# Patient Record
Sex: Female | Born: 1950 | Race: White | Hispanic: No | Marital: Married | Smoking: Never smoker
Health system: Southern US, Community
[De-identification: ages and names within clinical notes are randomized; demographics above are authoritative.]

## PROBLEM LIST (undated history)

## (undated) DIAGNOSIS — F32A Depression, unspecified: Secondary | ICD-10-CM

## (undated) DIAGNOSIS — F329 Major depressive disorder, single episode, unspecified: Secondary | ICD-10-CM

## (undated) DIAGNOSIS — M1990 Unspecified osteoarthritis, unspecified site: Secondary | ICD-10-CM

## (undated) DIAGNOSIS — F419 Anxiety disorder, unspecified: Secondary | ICD-10-CM

## (undated) DIAGNOSIS — I10 Essential (primary) hypertension: Secondary | ICD-10-CM

## (undated) DIAGNOSIS — T7840XA Allergy, unspecified, initial encounter: Secondary | ICD-10-CM

## (undated) HISTORY — PX: ABDOMINAL HYSTERECTOMY: SHX81

## (undated) HISTORY — PX: TONSILLECTOMY: SUR1361

## (undated) HISTORY — DX: Allergy, unspecified, initial encounter: T78.40XA

## (undated) HISTORY — DX: Essential (primary) hypertension: I10

## (undated) HISTORY — PX: APPENDECTOMY: SHX54

---
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
## 2011-04-27 ENCOUNTER — Ambulatory Visit: Payer: Self-pay | Admitting: Anesthesiology

## 2011-05-18 ENCOUNTER — Ambulatory Visit: Payer: Self-pay | Admitting: Anesthesiology

## 2011-06-19 ENCOUNTER — Ambulatory Visit: Payer: Self-pay | Admitting: Anesthesiology

## 2011-09-12 ENCOUNTER — Other Ambulatory Visit: Payer: Self-pay | Admitting: Neurosurgery

## 2011-09-12 DIAGNOSIS — M549 Dorsalgia, unspecified: Secondary | ICD-10-CM

## 2011-09-17 ENCOUNTER — Ambulatory Visit
Admission: RE | Admit: 2011-09-17 | Discharge: 2011-09-17 | Disposition: A | Discharge status: Home / Self Care | Payer: Worker's Compensation | Source: Ambulatory Visit | Attending: Neurosurgery | Admitting: Neurosurgery

## 2011-09-17 VITALS — BP 142/93 | HR 76

## 2011-09-17 DIAGNOSIS — M549 Dorsalgia, unspecified: Secondary | ICD-10-CM

## 2011-09-17 MED ORDER — ONDANSETRON HCL 4 MG/2ML IJ SOLN
4.0000 mg | Freq: Once | INTRAMUSCULAR | Status: AC
  Administered 2011-09-17: 4 mg via INTRAMUSCULAR

## 2011-09-17 MED ORDER — DIAZEPAM 5 MG PO TABS
10.0000 mg | ORAL_TABLET | Freq: Once | ORAL | Status: AC
  Administered 2011-09-17: 10 mg via ORAL

## 2011-09-17 MED ORDER — IOHEXOL 180 MG/ML  SOLN
17.0000 mL | Freq: Once | INTRAMUSCULAR | Status: AC | PRN
  Administered 2011-09-17: 17 mL via INTRAVENOUS

## 2011-09-17 MED ORDER — MEPERIDINE HCL 100 MG/ML IJ SOLN
75.0000 mg | Freq: Once | INTRAMUSCULAR | Status: AC
  Administered 2011-09-17: 75 mg via INTRAMUSCULAR

## 2011-09-17 NOTE — Progress Notes (Addendum)
Resting quietly in nursing station after myelogram.  Denies pain but states she is "just having some cramping."  Doesn't want pain medication at present.  Husband at bedside.  jkl  [phone]  Pt asked for pain meds due to back pain and IM pain meds given.dd

## 2011-09-17 NOTE — Discharge Instructions (Signed)

## 2011-09-18 ENCOUNTER — Telehealth: Payer: Self-pay

## 2011-09-18 NOTE — Telephone Encounter (Signed)
Explained to husband that it was not unusual for people to be more sore than normal post myelo, the contrast is irritating to the tissue and they also had a needle stick that could also be sore.dd

## 2014-04-07 DIAGNOSIS — M48062 Spinal stenosis, lumbar region with neurogenic claudication: Secondary | ICD-10-CM | POA: Insufficient documentation

## 2014-04-07 DIAGNOSIS — M5136 Other intervertebral disc degeneration, lumbar region: Secondary | ICD-10-CM | POA: Insufficient documentation

## 2014-04-07 DIAGNOSIS — M51369 Other intervertebral disc degeneration, lumbar region without mention of lumbar back pain or lower extremity pain: Secondary | ICD-10-CM | POA: Insufficient documentation

## 2014-04-07 DIAGNOSIS — M5416 Radiculopathy, lumbar region: Secondary | ICD-10-CM | POA: Insufficient documentation

## 2015-06-15 DIAGNOSIS — I10 Essential (primary) hypertension: Secondary | ICD-10-CM | POA: Diagnosis not present

## 2015-06-15 DIAGNOSIS — F329 Major depressive disorder, single episode, unspecified: Secondary | ICD-10-CM | POA: Diagnosis not present

## 2015-11-29 DIAGNOSIS — I10 Essential (primary) hypertension: Secondary | ICD-10-CM | POA: Diagnosis not present

## 2016-06-13 DIAGNOSIS — R21 Rash and other nonspecific skin eruption: Secondary | ICD-10-CM | POA: Diagnosis not present

## 2016-06-13 DIAGNOSIS — M545 Low back pain: Secondary | ICD-10-CM | POA: Diagnosis not present

## 2016-06-13 DIAGNOSIS — M79605 Pain in left leg: Secondary | ICD-10-CM | POA: Diagnosis not present

## 2016-11-14 DIAGNOSIS — M5030 Other cervical disc degeneration, unspecified cervical region: Secondary | ICD-10-CM | POA: Diagnosis not present

## 2016-11-14 DIAGNOSIS — M19011 Primary osteoarthritis, right shoulder: Secondary | ICD-10-CM | POA: Diagnosis not present

## 2016-11-14 DIAGNOSIS — M19012 Primary osteoarthritis, left shoulder: Secondary | ICD-10-CM | POA: Diagnosis not present

## 2016-11-14 DIAGNOSIS — M5412 Radiculopathy, cervical region: Secondary | ICD-10-CM | POA: Diagnosis not present

## 2017-01-11 DIAGNOSIS — M7541 Impingement syndrome of right shoulder: Secondary | ICD-10-CM | POA: Diagnosis not present

## 2017-01-11 DIAGNOSIS — M5412 Radiculopathy, cervical region: Secondary | ICD-10-CM | POA: Diagnosis not present

## 2017-01-11 DIAGNOSIS — M5030 Other cervical disc degeneration, unspecified cervical region: Secondary | ICD-10-CM | POA: Diagnosis not present

## 2017-01-11 DIAGNOSIS — M19012 Primary osteoarthritis, left shoulder: Secondary | ICD-10-CM | POA: Diagnosis not present

## 2017-01-11 DIAGNOSIS — M7542 Impingement syndrome of left shoulder: Secondary | ICD-10-CM | POA: Diagnosis not present

## 2017-01-11 DIAGNOSIS — M19011 Primary osteoarthritis, right shoulder: Secondary | ICD-10-CM | POA: Diagnosis not present

## 2017-03-21 DIAGNOSIS — I10 Essential (primary) hypertension: Secondary | ICD-10-CM | POA: Diagnosis not present

## 2017-03-21 DIAGNOSIS — Z760 Encounter for issue of repeat prescription: Secondary | ICD-10-CM | POA: Diagnosis not present

## 2017-03-21 DIAGNOSIS — F419 Anxiety disorder, unspecified: Secondary | ICD-10-CM | POA: Diagnosis not present

## 2017-04-12 ENCOUNTER — Other Ambulatory Visit: Payer: Self-pay | Admitting: Physical Medicine and Rehabilitation

## 2017-04-12 DIAGNOSIS — M19012 Primary osteoarthritis, left shoulder: Secondary | ICD-10-CM | POA: Diagnosis not present

## 2017-04-12 DIAGNOSIS — M5412 Radiculopathy, cervical region: Secondary | ICD-10-CM

## 2017-04-12 DIAGNOSIS — M19011 Primary osteoarthritis, right shoulder: Secondary | ICD-10-CM | POA: Diagnosis not present

## 2017-04-12 DIAGNOSIS — M7542 Impingement syndrome of left shoulder: Secondary | ICD-10-CM | POA: Diagnosis not present

## 2017-04-12 DIAGNOSIS — M5030 Other cervical disc degeneration, unspecified cervical region: Secondary | ICD-10-CM | POA: Diagnosis not present

## 2017-04-12 DIAGNOSIS — M7541 Impingement syndrome of right shoulder: Secondary | ICD-10-CM

## 2017-04-23 ENCOUNTER — Ambulatory Visit
Admission: RE | Admit: 2017-04-23 | Discharge: 2017-04-23 | Disposition: A | Discharge status: Home / Self Care | Payer: PPO | Source: Ambulatory Visit | Attending: Physical Medicine and Rehabilitation | Admitting: Physical Medicine and Rehabilitation

## 2017-04-23 DIAGNOSIS — M542 Cervicalgia: Secondary | ICD-10-CM | POA: Diagnosis not present

## 2017-04-23 DIAGNOSIS — M19011 Primary osteoarthritis, right shoulder: Secondary | ICD-10-CM | POA: Insufficient documentation

## 2017-04-23 DIAGNOSIS — M7541 Impingement syndrome of right shoulder: Secondary | ICD-10-CM | POA: Diagnosis not present

## 2017-04-23 DIAGNOSIS — M67911 Unspecified disorder of synovium and tendon, right shoulder: Secondary | ICD-10-CM | POA: Diagnosis not present

## 2017-04-23 DIAGNOSIS — M7551 Bursitis of right shoulder: Secondary | ICD-10-CM | POA: Diagnosis not present

## 2017-04-23 DIAGNOSIS — M50122 Cervical disc disorder at C5-C6 level with radiculopathy: Secondary | ICD-10-CM | POA: Diagnosis not present

## 2017-04-23 DIAGNOSIS — M5030 Other cervical disc degeneration, unspecified cervical region: Secondary | ICD-10-CM | POA: Diagnosis present

## 2017-04-23 DIAGNOSIS — M4802 Spinal stenosis, cervical region: Secondary | ICD-10-CM | POA: Diagnosis not present

## 2017-04-23 DIAGNOSIS — M19012 Primary osteoarthritis, left shoulder: Secondary | ICD-10-CM | POA: Insufficient documentation

## 2017-04-23 DIAGNOSIS — M25511 Pain in right shoulder: Secondary | ICD-10-CM | POA: Diagnosis not present

## 2017-04-23 DIAGNOSIS — M5412 Radiculopathy, cervical region: Secondary | ICD-10-CM

## 2017-04-26 ENCOUNTER — Other Ambulatory Visit: Payer: Self-pay | Admitting: Physical Medicine and Rehabilitation

## 2017-04-26 DIAGNOSIS — M25512 Pain in left shoulder: Secondary | ICD-10-CM

## 2017-05-03 ENCOUNTER — Ambulatory Visit
Admission: RE | Admit: 2017-05-03 | Discharge: 2017-05-03 | Disposition: A | Discharge status: Home / Self Care | Payer: PPO | Source: Ambulatory Visit | Attending: Physical Medicine and Rehabilitation | Admitting: Physical Medicine and Rehabilitation

## 2017-05-03 ENCOUNTER — Encounter: Payer: Self-pay | Admitting: Radiology

## 2017-05-03 DIAGNOSIS — M25512 Pain in left shoulder: Secondary | ICD-10-CM

## 2017-05-03 DIAGNOSIS — M19012 Primary osteoarthritis, left shoulder: Secondary | ICD-10-CM | POA: Insufficient documentation

## 2017-05-03 DIAGNOSIS — M25712 Osteophyte, left shoulder: Secondary | ICD-10-CM | POA: Diagnosis not present

## 2017-05-16 DIAGNOSIS — M25511 Pain in right shoulder: Secondary | ICD-10-CM | POA: Diagnosis not present

## 2017-05-16 DIAGNOSIS — M19011 Primary osteoarthritis, right shoulder: Secondary | ICD-10-CM | POA: Diagnosis not present

## 2017-10-02 DIAGNOSIS — I10 Essential (primary) hypertension: Secondary | ICD-10-CM | POA: Diagnosis not present

## 2017-10-02 DIAGNOSIS — F419 Anxiety disorder, unspecified: Secondary | ICD-10-CM | POA: Diagnosis not present

## 2017-10-02 DIAGNOSIS — Z760 Encounter for issue of repeat prescription: Secondary | ICD-10-CM | POA: Diagnosis not present

## 2017-12-31 DIAGNOSIS — M19011 Primary osteoarthritis, right shoulder: Secondary | ICD-10-CM | POA: Diagnosis not present

## 2018-01-30 DIAGNOSIS — M19012 Primary osteoarthritis, left shoulder: Secondary | ICD-10-CM | POA: Diagnosis not present

## 2018-04-02 DIAGNOSIS — M19011 Primary osteoarthritis, right shoulder: Secondary | ICD-10-CM | POA: Diagnosis not present

## 2018-04-16 DIAGNOSIS — F329 Major depressive disorder, single episode, unspecified: Secondary | ICD-10-CM | POA: Diagnosis not present

## 2018-04-16 DIAGNOSIS — I10 Essential (primary) hypertension: Secondary | ICD-10-CM | POA: Diagnosis not present

## 2018-04-16 DIAGNOSIS — M25551 Pain in right hip: Secondary | ICD-10-CM | POA: Diagnosis not present

## 2018-05-12 DIAGNOSIS — M19012 Primary osteoarthritis, left shoulder: Secondary | ICD-10-CM | POA: Diagnosis not present

## 2018-07-07 DIAGNOSIS — M19011 Primary osteoarthritis, right shoulder: Secondary | ICD-10-CM | POA: Diagnosis not present

## 2018-07-11 DIAGNOSIS — M5030 Other cervical disc degeneration, unspecified cervical region: Secondary | ICD-10-CM | POA: Diagnosis not present

## 2018-07-11 DIAGNOSIS — M7541 Impingement syndrome of right shoulder: Secondary | ICD-10-CM | POA: Diagnosis not present

## 2018-07-11 DIAGNOSIS — M19011 Primary osteoarthritis, right shoulder: Secondary | ICD-10-CM | POA: Diagnosis not present

## 2018-07-11 DIAGNOSIS — M19012 Primary osteoarthritis, left shoulder: Secondary | ICD-10-CM | POA: Diagnosis not present

## 2018-07-11 DIAGNOSIS — M5412 Radiculopathy, cervical region: Secondary | ICD-10-CM | POA: Diagnosis not present

## 2018-07-11 DIAGNOSIS — M7542 Impingement syndrome of left shoulder: Secondary | ICD-10-CM | POA: Diagnosis not present

## 2018-09-03 DIAGNOSIS — M48062 Spinal stenosis, lumbar region with neurogenic claudication: Secondary | ICD-10-CM | POA: Diagnosis not present

## 2018-09-03 DIAGNOSIS — M5416 Radiculopathy, lumbar region: Secondary | ICD-10-CM | POA: Diagnosis not present

## 2018-09-03 DIAGNOSIS — M5136 Other intervertebral disc degeneration, lumbar region: Secondary | ICD-10-CM | POA: Diagnosis not present

## 2019-01-09 DIAGNOSIS — M5412 Radiculopathy, cervical region: Secondary | ICD-10-CM | POA: Diagnosis not present

## 2019-01-09 DIAGNOSIS — M5416 Radiculopathy, lumbar region: Secondary | ICD-10-CM | POA: Diagnosis not present

## 2019-01-09 DIAGNOSIS — M7542 Impingement syndrome of left shoulder: Secondary | ICD-10-CM | POA: Diagnosis not present

## 2019-01-09 DIAGNOSIS — M19011 Primary osteoarthritis, right shoulder: Secondary | ICD-10-CM | POA: Diagnosis not present

## 2019-01-09 DIAGNOSIS — M5136 Other intervertebral disc degeneration, lumbar region: Secondary | ICD-10-CM | POA: Diagnosis not present

## 2019-01-09 DIAGNOSIS — M19012 Primary osteoarthritis, left shoulder: Secondary | ICD-10-CM | POA: Diagnosis not present

## 2019-01-09 DIAGNOSIS — M7541 Impingement syndrome of right shoulder: Secondary | ICD-10-CM | POA: Diagnosis not present

## 2019-01-09 DIAGNOSIS — M48062 Spinal stenosis, lumbar region with neurogenic claudication: Secondary | ICD-10-CM | POA: Diagnosis not present

## 2019-01-09 DIAGNOSIS — I10 Essential (primary) hypertension: Secondary | ICD-10-CM | POA: Diagnosis not present

## 2019-03-09 ENCOUNTER — Other Ambulatory Visit: Payer: Self-pay

## 2019-03-09 ENCOUNTER — Encounter: Payer: Self-pay | Admitting: Nurse Practitioner

## 2019-03-09 ENCOUNTER — Ambulatory Visit (INDEPENDENT_AMBULATORY_CARE_PROVIDER_SITE_OTHER): Payer: PPO | Admitting: Nurse Practitioner

## 2019-03-09 ENCOUNTER — Ambulatory Visit: Payer: Self-pay | Admitting: Nurse Practitioner

## 2019-03-09 VITALS — BP 126/74 | HR 73 | Ht 65.0 in | Wt 186.4 lb

## 2019-03-09 DIAGNOSIS — Z23 Encounter for immunization: Secondary | ICD-10-CM

## 2019-03-09 DIAGNOSIS — I10 Essential (primary) hypertension: Secondary | ICD-10-CM

## 2019-03-09 DIAGNOSIS — F329 Major depressive disorder, single episode, unspecified: Secondary | ICD-10-CM

## 2019-03-09 DIAGNOSIS — Z7689 Persons encountering health services in other specified circumstances: Secondary | ICD-10-CM

## 2019-03-09 MED ORDER — CITALOPRAM HYDROBROMIDE 20 MG PO TABS: 20.0000 mg | ORAL_TABLET | Freq: Every day | ORAL | 1 refills | Status: DC

## 2019-03-09 MED ORDER — LISINOPRIL-HYDROCHLOROTHIAZIDE 20-25 MG PO TABS: 1.0000 | ORAL_TABLET | Freq: Every day | ORAL | 1 refills | Status: DC

## 2019-03-09 NOTE — Progress Notes (Signed)
Subjective:    Patient ID: Linda Farley, female    DOB: 1950-08-30, 68 y.o.   MRN: [id number]  Linda Farley is a 68 y.o. female presenting on 03/09/2019 for Establish Care, Depression, and Hypertension  HPI Establish Care New Provider Pt last seen by PCP Dr. Audie Pinto 1 year ago.  Obtain records.   - Dr. Audie Pinto "disappeared on me."  Depression Tearfulness. Patient had been on citalopram for a long time, stopped this when she couldn't get refills.  Pain control is also worsening with decreased medication. - Has increasing chest pain only when tired.  Patient stops and rests.  Then pain resolves. Patient declines EKG today. - Patient has had multiple losses during holidays, christmas including parents, in-laws, brother, and son (suicide).   - Patient has had refills from Dr. Sharlet Salina except for citalopram for last 6 months.  Hypertension - She is not checking BP at home or outside of clinic.    - Current medications: tolerating well without side effects - She is not currently symptomatic. - Pt denies headache, lightheadedness, dizziness, changes in vision, palpitations, leg swelling, sudden loss of speech or loss of consciousness. - no chest pain with exertion only when tired.  Depression screen PHQ 2/9 03/09/2019  Decreased Interest 1  Down, Depressed, Hopeless 1  PHQ - 2 Score 2  Altered sleeping 1  Tired, decreased energy 1  Change in appetite 0  Feeling bad or failure about yourself  0  Trouble concentrating 0  Moving slowly or fidgety/restless 0  Suicidal thoughts 0  PHQ-9 Score 4  Difficult doing work/chores Somewhat difficult    Past Medical History:  Diagnosis Date  . Allergy   . Hypertension    Past Surgical History:  Procedure Laterality Date  . ABDOMINAL HYSTERECTOMY     Social History   Socioeconomic History  . Marital status: Married    Spouse name: Not on file  . Number of children: Not on file  . Years of education: Not on file  . Highest education  level: Not on file  Occupational History  . Not on file  Social Needs  . Financial resource strain: Not on file  . Food insecurity    Worry: Not on file    Inability: Not on file  . Transportation needs    Medical: Not on file    Non-medical: Not on file  Tobacco Use  . Smoking status: Never Smoker  . Smokeless tobacco: Never Used  Substance and Sexual Activity  . Alcohol use: Yes    Alcohol/week: 3.0 standard drinks    Types: 3 Glasses of wine per week    Comment: 3 wine coolers weekly  . Drug use: Never  . Sexual activity: Not on file  Lifestyle  . Physical activity    Days per week: Not on file    Minutes per session: Not on file  . Stress: Not on file  Relationships  . Social Herbalist on phone: Not on file    Gets together: Not on file    Attends religious service: Not on file    Active member of club or organization: Not on file    Attends meetings of clubs or organizations: Not on file    Relationship status: Not on file  . Intimate partner violence    Fear of current or ex partner: Not on file    Emotionally abused: Not on file    Physically abused: Not on file  Forced sexual activity: Not on file  Other Topics Concern  . Not on file  Social History Narrative  . Not on file   Family History  Problem Relation Age of Onset  . Prostate cancer Father   . Bone cancer Brother   . Heart disease Brother    Current Outpatient Medications on File Prior to Visit  Medication Sig  . Cholecalciferol (D3 SUPER STRENGTH) 50 MCG (2000 UT) CAPS Take by mouth.  . gabapentin (NEURONTIN) 300 MG capsule Take 600 mg by mouth at bedtime.  Marland Kitchen HYDROcodone-acetaminophen (NORCO) 7.5-325 MG tablet TAKE 1 2 TO 1 (ONE HALF TO ONE) TABLET BY MOUTH ONCE DAILY AS NEEDED  . Magnesium 500 MG TABS Take 1 tablet by mouth daily.  . meloxicam (MOBIC) 15 MG tablet Take 15 mg by mouth daily.  . traMADol (ULTRAM) 50 MG tablet Take 50 mg by mouth 3 (three) times daily as needed.  .  Turmeric 1053 MG TABS Take by mouth.   No current facility-administered medications on file prior to visit.     Review of Systems  Constitutional: Positive for fatigue.  Psychiatric/Behavioral: Positive for dysphoric mood and sleep disturbance. Negative for self-injury and suicidal ideas.   Per HPI unless specifically indicated above    Objective:    BP 126/74 (BP Location: Right Arm, Patient Position: Sitting, Cuff Size: Normal)   Pulse 73   Ht 5\' 5"  (1.651 m)   Wt 186 lb 6.4 oz (84.6 kg)   SpO2 100%   BMI 31.02 kg/m   Wt Readings from Last 3 Encounters:  03/09/19 186 lb 6.4 oz (84.6 kg)    Physical Exam Vitals signs reviewed.  Constitutional:      General: She is not in acute distress.    Appearance: She is well-developed.  HENT:     Head: Normocephalic and atraumatic.  Cardiovascular:     Rate and Rhythm: Normal rate and regular rhythm.     Pulses:          Radial pulses are 2+ on the right side and 2+ on the left side.       Posterior tibial pulses are 1+ on the right side and 1+ on the left side.     Heart sounds: Normal heart sounds, S1 normal and S2 normal.  Pulmonary:     Effort: Pulmonary effort is normal. No respiratory distress.     Breath sounds: Normal breath sounds and air entry.  Musculoskeletal:     Right lower leg: No edema.     Left lower leg: No edema.  Skin:    General: Skin is warm and dry.     Capillary Refill: Capillary refill takes less than 2 seconds.  Neurological:     Mental Status: She is alert and oriented to person, place, and time.  Psychiatric:        Attention and Perception: Attention and perception normal.        Mood and Affect: Affect normal. Mood is depressed.        Speech: Speech normal.        Behavior: Behavior normal. Behavior is cooperative.        Thought Content: Thought content normal.        Cognition and Memory: Cognition and memory normal.        Judgment: Judgment normal.        Assessment & Plan:    Problem List Items Addressed This Visit    None    Visit  Diagnoses    Reactive depression    -  Primary Worsening off medications.  Seasonal variant due to significant grief of loved ones around Christmas holiday.   - Resume citalopram - Encouraged grief counseling - Follow-up 6 weeks.   Relevant Medications   citalopram (CELEXA) 20 MG tablet   Needs flu shot       Relevant Orders   Flu Vaccine QUAD High Dose(Fluad) (Completed)   Essential hypertension     Stable today on exam.  Refills needed and provided.  Labs due 3 months.  Follow-up 3 months.   Relevant Medications   lisinopril-hydrochlorothiazide (ZESTORETIC) 20-25 MG tablet   Encounter to establish care     Previous PCP was at Dr. Audie Pinto.  Records will be requested.  Past medical, family, and surgical history reviewed w/ pt.         Meds ordered this encounter  Medications  . citalopram (CELEXA) 20 MG tablet    Sig: Take 1 tablet (20 mg total) by mouth daily.    Dispense:  90 tablet    Refill:  1    Order Specific Question:   Supervising Provider    Answer:   Olin Hauser [[id number]]  . lisinopril-hydrochlorothiazide (ZESTORETIC) 20-25 MG tablet    Sig: Take 1 tablet by mouth daily.    Dispense:  90 tablet    Refill:  1    Order Specific Question:   Supervising Provider    Answer:   Olin Hauser [[id number]]   Follow up plan: Return in about 3 months (around 06/08/2019) for hypertension.  Cassell Smiles, DNP, AGPCNP-BC Adult Gerontology Primary Care Nurse Practitioner [facility] 03/09/2019, 1:47 PM

## 2019-03-09 NOTE — Patient Instructions (Addendum)
Linda Farley,   Thank you for coming in to clinic today.  1. Resume citalopram - START with 10 mg 1/2 tablet for 1 week.  Then increase back to 20 mg once daily. - Consider future grief counseling.  It is never too late to start.  2. Continue your current blood pressure medications.  3. Continue with Dr. Sharlet Salina   4. If you continue having chest pain after 2-3 weeks back on citaolpram.  Call clinic for cardiac workup.  Please schedule a follow-up appointment. Return in about 3 months (around 06/08/2019) for hypertension.  If you have any other questions or concerns, please feel free to call the clinic or send a message through [facility]. You may also schedule an earlier appointment if necessary.  You will receive a survey after today's visit either digitally by e-mail or paper by [employer]. Your experiences and feedback matter to [place].  Please respond so we know how we are doing as we provide care for you.  Cassell Smiles, DNP, AGNP-BC Adult Gerontology Nurse Practitioner [place]

## 2019-03-11 ENCOUNTER — Encounter: Payer: Self-pay | Admitting: Nurse Practitioner

## 2019-03-11 DIAGNOSIS — I10 Essential (primary) hypertension: Secondary | ICD-10-CM | POA: Insufficient documentation

## 2019-03-11 DIAGNOSIS — F329 Major depressive disorder, single episode, unspecified: Secondary | ICD-10-CM | POA: Insufficient documentation

## 2019-03-27 ENCOUNTER — Other Ambulatory Visit: Payer: Self-pay | Admitting: Nurse Practitioner

## 2019-03-27 ENCOUNTER — Telehealth: Payer: Self-pay

## 2019-03-27 DIAGNOSIS — F329 Major depressive disorder, single episode, unspecified: Secondary | ICD-10-CM

## 2019-03-27 MED ORDER — CITALOPRAM HYDROBROMIDE 10 MG PO TABS: 10.0000 mg | ORAL_TABLET | Freq: Every day | ORAL | 1 refills | Status: DC

## 2019-03-27 NOTE — Telephone Encounter (Signed)
The pt called with concerns about her Citalopram. She want to know if she could change her depression medication, because she is experiencing weight gain and fatigue that she associate with this medication. She ask if or when we call back do NOT leave a message on her voicemail or with her husband.

## 2019-03-27 NOTE — Telephone Encounter (Signed)
Patient did not have weight gain with prior use of citalopram.  Has stopped crying all the time.  Is satisfied with recommendation to reduce dose to 10 mg daily. New Rx sent for citalopram 10 mg once daily.

## 2019-04-10 DIAGNOSIS — M5412 Radiculopathy, cervical region: Secondary | ICD-10-CM | POA: Diagnosis not present

## 2019-04-10 DIAGNOSIS — M19011 Primary osteoarthritis, right shoulder: Secondary | ICD-10-CM | POA: Diagnosis not present

## 2019-04-10 DIAGNOSIS — M5416 Radiculopathy, lumbar region: Secondary | ICD-10-CM | POA: Diagnosis not present

## 2019-04-10 DIAGNOSIS — M5136 Other intervertebral disc degeneration, lumbar region: Secondary | ICD-10-CM | POA: Diagnosis not present

## 2019-04-10 DIAGNOSIS — M19012 Primary osteoarthritis, left shoulder: Secondary | ICD-10-CM | POA: Diagnosis not present

## 2019-04-10 DIAGNOSIS — M7542 Impingement syndrome of left shoulder: Secondary | ICD-10-CM | POA: Diagnosis not present

## 2019-04-10 DIAGNOSIS — M7541 Impingement syndrome of right shoulder: Secondary | ICD-10-CM | POA: Diagnosis not present

## 2019-04-10 DIAGNOSIS — M48062 Spinal stenosis, lumbar region with neurogenic claudication: Secondary | ICD-10-CM | POA: Diagnosis not present

## 2019-06-08 DIAGNOSIS — Z1211 Encounter for screening for malignant neoplasm of colon: Secondary | ICD-10-CM | POA: Diagnosis not present

## 2019-07-10 DIAGNOSIS — M7551 Bursitis of right shoulder: Secondary | ICD-10-CM | POA: Diagnosis not present

## 2019-07-10 DIAGNOSIS — M7552 Bursitis of left shoulder: Secondary | ICD-10-CM | POA: Diagnosis not present

## 2019-07-10 DIAGNOSIS — M19012 Primary osteoarthritis, left shoulder: Secondary | ICD-10-CM | POA: Diagnosis not present

## 2019-07-10 DIAGNOSIS — R0781 Pleurodynia: Secondary | ICD-10-CM | POA: Diagnosis not present

## 2019-07-10 DIAGNOSIS — M5412 Radiculopathy, cervical region: Secondary | ICD-10-CM | POA: Diagnosis not present

## 2019-07-10 DIAGNOSIS — M19011 Primary osteoarthritis, right shoulder: Secondary | ICD-10-CM | POA: Diagnosis not present

## 2019-07-12 DIAGNOSIS — M5412 Radiculopathy, cervical region: Secondary | ICD-10-CM | POA: Diagnosis not present

## 2019-07-12 DIAGNOSIS — M7551 Bursitis of right shoulder: Secondary | ICD-10-CM | POA: Diagnosis not present

## 2019-07-12 DIAGNOSIS — M19011 Primary osteoarthritis, right shoulder: Secondary | ICD-10-CM | POA: Diagnosis not present

## 2019-07-12 DIAGNOSIS — R0781 Pleurodynia: Secondary | ICD-10-CM | POA: Diagnosis not present

## 2019-07-12 DIAGNOSIS — M7552 Bursitis of left shoulder: Secondary | ICD-10-CM | POA: Diagnosis not present

## 2019-07-12 DIAGNOSIS — M19012 Primary osteoarthritis, left shoulder: Secondary | ICD-10-CM | POA: Diagnosis not present

## 2019-08-04 ENCOUNTER — Ambulatory Visit (INDEPENDENT_AMBULATORY_CARE_PROVIDER_SITE_OTHER): Payer: PPO | Admitting: Family Medicine

## 2019-08-04 ENCOUNTER — Encounter: Payer: Self-pay | Admitting: Family Medicine

## 2019-08-04 ENCOUNTER — Other Ambulatory Visit: Payer: Self-pay

## 2019-08-04 VITALS — BP 106/72 | HR 89 | Temp 97.6°F | Ht 65.0 in | Wt 190.6 lb

## 2019-08-04 DIAGNOSIS — E669 Obesity, unspecified: Secondary | ICD-10-CM | POA: Diagnosis not present

## 2019-08-04 DIAGNOSIS — R5382 Chronic fatigue, unspecified: Secondary | ICD-10-CM

## 2019-08-04 DIAGNOSIS — J45998 Other asthma: Secondary | ICD-10-CM

## 2019-08-04 DIAGNOSIS — R0683 Snoring: Secondary | ICD-10-CM

## 2019-08-04 DIAGNOSIS — I10 Essential (primary) hypertension: Secondary | ICD-10-CM | POA: Diagnosis not present

## 2019-08-04 DIAGNOSIS — R5383 Other fatigue: Secondary | ICD-10-CM | POA: Insufficient documentation

## 2019-08-04 DIAGNOSIS — F329 Major depressive disorder, single episode, unspecified: Secondary | ICD-10-CM

## 2019-08-04 MED ORDER — ALBUTEROL SULFATE HFA 108 (90 BASE) MCG/ACT IN AERS: 2.0000 | INHALATION_SPRAY | Freq: Four times a day (QID) | RESPIRATORY_TRACT | 1 refills | Status: DC | PRN

## 2019-08-04 NOTE — Assessment & Plan Note (Signed)
Currently taking Lisinopril-HCTZ 20-25mg  daily.  Tolerating well.  Denies any side effects of medications.  Plan: 1) Continue taking Lisinopril-HCTZ 20-25mg  daily 2) Continue heart healthy diet and increase exercise, aiming to exercise every other day for 30 minutes but going no more than 2 days in a row without exercise. 3) Have your labs drawn that were ordered today 4) Maintain a blood pressure log, writing down results 1-2x a week, bring to return appointment. 5) Return to clinic in 6 months for follow up visit

## 2019-08-04 NOTE — Progress Notes (Signed)
I have reviewed this encounter including the documentation in this note and/or discussed this patient with the provider, Cyndia Skeeters FNP. I am certifying that I agree with the content of this note as supervising physician.  Nobie Putnam, [facility] 08/04/2019, 4:48 PM

## 2019-08-04 NOTE — Assessment & Plan Note (Signed)
Will have labs drawn and will check thyroid function as well as baseline labs.  Given history of snoring, excessive daytime sleepiness and waking not feeling rested, will order a home sleep study.    Will call once results have been received to discuss.  Discussed medications for excessive daytime sleepiness, per patient request, and did review OTC medications such as Allegra D, Claritin D, and Zyrtec D, but given her history of hypertension it would not be advisable.  If she did want to try a dose of it, she would need to be checking her blood pressure and to contact us if she had any elevations over 130/80.  Patient in agreement with plan.

## 2019-08-04 NOTE — Patient Instructions (Addendum)
Try to get exercise a minimum of 30 minutes per day at least 5 days per week as well as  adequate water intake all while measuring blood pressure a few times per week.  Keep a blood pressure log and bring back to clinic at your next visit.  If your readings are consistently over 140/90 to contact our office/send me a MyChart message and we will see you sooner.  Can try DASH and Mediterranean diet options, avoiding processed foods, lowering sodium intake, avoiding pork products, and eating a plant based diet for optimal health.  Will call you with the results of your lab work once received.  I have put in a referral for a sleep study and can discuss options once results are received.  Will follow up with Cologuard for your results, as you report returning the sample approximately 3 months ago.  You will receive a survey after today's visit either digitally by e-mail or paper by [employer]. Your experiences and feedback matter to [place].  Please respond so we know how we are doing as we provide care for you.  Call us with any questions/concerns/needs.  It is my goal to be available to you for your health concerns.  Thanks for choosing me to be a partner in your healthcare needs!  Harlin Rain, FNP-C Family Nurse Practitioner [facility] Phone: [phone]

## 2019-08-04 NOTE — Progress Notes (Signed)
Subjective:    Patient ID: Linda Farley, female    DOB: July 30, 1950, 69 y.o.   MRN: [id number]  Linda Farley is a 69 y.o. female presenting on 08/04/2019 for Hypertension   HPI  Patient presents to clinic for follow up for Hypertension and Depression.  Has been taking her medications, Celexa 20mg  daily, and Lisinopril-HCTZ 20-25mg  daily, and tolerating well.  Denies any headaches, visual changes, dizziness, lightheadedness, chest pain, palpitations, leg swelling.  Does have concern for excessive daytime sleepiness that has been going on for a while.  States knows she snores, is not waking up feeling rested.  Scores 4 out of 8 on [employer] Questionnaire.  Has not tried anything for her symptoms.  Has a history of seasonal asthma, uses an Albuterol inhaler a few times in the spring and fall, requesting refill.  Reviewed health maintenance needs. Reviewed need for Mammogram, discussed risks/benefits/screening, offered and declined Reviewed need for DEXA.  States is following with Dr. Sharlet Salina and will be completing bone density with his office.  Depression screen [facility] 2/9 08/04/2019 03/09/2019  Decreased Interest 1 1  Down, Depressed, Hopeless 0 1  PHQ - 2 Score 1 2  Altered sleeping 3 1  Tired, decreased energy 3 1  Change in appetite 0 0  Feeling bad or failure about yourself  0 0  Trouble concentrating 1 0  Moving slowly or fidgety/restless 0 0  Suicidal thoughts 0 0  PHQ-9 Score 8 4  Difficult doing work/chores Somewhat difficult Somewhat difficult    Social History   Tobacco Use  . Smoking status: Never Smoker  . Smokeless tobacco: Never Used  Substance Use Topics  . Alcohol use: Yes    Alcohol/week: 3.0 standard drinks    Types: 3 Glasses of wine per week    Comment: 3 wine coolers weekly  . Drug use: Never    Review of Systems  Constitutional: Positive for fatigue. Negative for activity change, appetite change, fever and unexpected weight change.  HENT: Negative  for congestion, ear pain, nosebleeds, postnasal drip, sinus pressure, sinus pain, sneezing and sore throat.   Eyes: Negative for pain and visual disturbance.  Respiratory: Negative.   Cardiovascular: Negative.   Gastrointestinal: Negative.   Endocrine: Negative.   Genitourinary: Negative.   Musculoskeletal: Negative.   Neurological: Negative.   Hematological: Negative.   Psychiatric/Behavioral: Negative.    Per HPI unless specifically indicated above     Objective:    BP 106/72 (BP Location: Right Arm, Patient Position: Sitting, Cuff Size: Normal)   Pulse 89   Temp 97.6 F (36.4 C) (Temporal)   Ht 5\' 5"  (1.651 m)   Wt 190 lb 9.6 oz (86.5 kg)   BMI 31.72 kg/m   Wt Readings from Last 3 Encounters:  08/04/19 190 lb 9.6 oz (86.5 kg)  03/09/19 186 lb 6.4 oz (84.6 kg)    Physical Exam Vitals reviewed.  Constitutional:      General: She is not in acute distress.    Appearance: Normal appearance. She is obese. She is not ill-appearing or toxic-appearing.  HENT:     Head: Normocephalic.  Eyes:     General:        Right eye: No discharge.        Left eye: No discharge.     Extraocular Movements: Extraocular movements intact.     Conjunctiva/sclera: Conjunctivae normal.     Pupils: Pupils are equal, round, and reactive to light.  Neck:  Thyroid: No thyroid mass, thyromegaly or thyroid tenderness.  Cardiovascular:     Rate and Rhythm: Normal rate and regular rhythm.     Pulses: Normal pulses.          Dorsalis pedis pulses are 2+ on the right side and 2+ on the left side.       Posterior tibial pulses are 2+ on the right side and 2+ on the left side.     Heart sounds: Normal heart sounds. No murmur. No friction rub. No gallop.   Pulmonary:     Effort: Pulmonary effort is normal. No respiratory distress.     Breath sounds: Normal breath sounds.  Abdominal:     General: Abdomen is flat. Bowel sounds are normal. There is no distension.     Palpations: Abdomen is soft.  There is no hepatomegaly, splenomegaly or mass.     Tenderness: There is no abdominal tenderness. There is no guarding or rebound.     Hernia: No hernia is present.  Musculoskeletal:        General: Normal range of motion.     Cervical back: Normal range of motion and neck supple. No tenderness.     Right lower leg: No edema.     Left lower leg: No edema.  Feet:     Right foot:     Skin integrity: Skin integrity normal.     Left foot:     Skin integrity: Skin integrity normal.  Lymphadenopathy:     Cervical: No cervical adenopathy.  Skin:    General: Skin is warm and dry.     Capillary Refill: Capillary refill takes less than 2 seconds.  Neurological:     Mental Status: She is alert and oriented to person, place, and time.     Cranial Nerves: Cranial nerves are intact.     Sensory: Sensation is intact.     Motor: Motor function is intact.     Gait: Gait is intact.  Psychiatric:        Attention and Perception: Attention and perception normal.        Mood and Affect: Mood and affect normal.        Speech: Speech normal.        Behavior: Behavior normal. Behavior is cooperative.        Thought Content: Thought content normal.        Cognition and Memory: Cognition normal.        Judgment: Judgment normal.    No results found for this or any previous visit.    Assessment & Plan:   Problem List Items Addressed This Visit      Cardiovascular and Mediastinum   Essential hypertension - Primary    Currently taking Lisinopril-HCTZ 20-25mg  daily.  Tolerating well.  Denies any side effects of medications.  Plan: 1) Continue taking Lisinopril-HCTZ 20-25mg  daily 2) Continue heart healthy diet and increase exercise, aiming to exercise every other day for 30 minutes but going no more than 2 days in a row without exercise. 3) Have your labs drawn that were ordered today 4) Maintain a blood pressure log, writing down results 1-2x a week, bring to return appointment. 5) Return to  clinic in 6 months for follow up visit      Relevant Orders   CBC with Differential   Comprehensive Metabolic Panel (CMET)     Other   Fatigue    Will have labs drawn and will check thyroid function as well as baseline  labs.  Given history of snoring, excessive daytime sleepiness and waking not feeling rested, will order a home sleep study.    Will call once results have been received to discuss.  Discussed medications for excessive daytime sleepiness, per patient request, and did review OTC medications such as Allegra D, Claritin D, and Zyrtec D, but given her history of hypertension it would not be advisable.  If she did want to try a dose of it, she would need to be checking her blood pressure and to contact us if she had any elevations over 130/80.  Patient in agreement with plan.      Reactive depression    Currently taking Celexa 20mg  daily and tolerating well.  Reports feeling great on medication.  Will continue and follow up in 6 months, sooner should any questions/concerns/needs arise.      Relevant Medications   citalopram (CELEXA) 20 MG tablet   Snoring   Relevant Orders   Home sleep test    Other Visit Diagnoses    Obesity (BMI 30-39.9)       Relevant Orders   Lipid Profile   Thyroid Panel With TSH   HgB A1c   Seasonal asthma       Relevant Medications   albuterol (VENTOLIN HFA) 108 (90 Base) MCG/ACT inhaler      Meds ordered this encounter  Medications  . albuterol (VENTOLIN HFA) 108 (90 Base) MCG/ACT inhaler    Sig: Inhale 2 puffs into the lungs every 6 (six) hours as needed for wheezing or shortness of breath.    Dispense:  8.5 g    Refill:  1    Order Specific Question:   Supervising Provider    Answer:   Olin Hauser [[id number]]      Follow up plan: Return in about 6 months (around 02/01/2020) for HTN and follow up depression.   Harlin Rain, [place] [facility] Nurse Practitioner [facility] 08/04/2019, 2:29 PM

## 2019-08-04 NOTE — Assessment & Plan Note (Signed)
Currently taking Celexa 20mg  daily and tolerating well.  Reports feeling great on medication.  Will continue and follow up in 6 months, sooner should any questions/concerns/needs arise.

## 2019-08-05 ENCOUNTER — Other Ambulatory Visit: Payer: Self-pay | Admitting: Family Medicine

## 2019-08-05 DIAGNOSIS — Z1211 Encounter for screening for malignant neoplasm of colon: Secondary | ICD-10-CM

## 2019-09-01 DIAGNOSIS — Z1212 Encounter for screening for malignant neoplasm of rectum: Secondary | ICD-10-CM | POA: Diagnosis not present

## 2019-09-01 DIAGNOSIS — Z1211 Encounter for screening for malignant neoplasm of colon: Secondary | ICD-10-CM | POA: Diagnosis not present

## 2019-09-01 LAB — COLOGUARD: Cologuard: POSITIVE — AB

## 2019-09-03 ENCOUNTER — Other Ambulatory Visit: Payer: PPO

## 2019-09-03 DIAGNOSIS — E669 Obesity, unspecified: Secondary | ICD-10-CM | POA: Diagnosis not present

## 2019-09-03 DIAGNOSIS — I10 Essential (primary) hypertension: Secondary | ICD-10-CM | POA: Diagnosis not present

## 2019-09-04 ENCOUNTER — Other Ambulatory Visit: Payer: Self-pay | Admitting: Family Medicine

## 2019-09-04 ENCOUNTER — Other Ambulatory Visit: Payer: Self-pay | Admitting: Nurse Practitioner

## 2019-09-04 ENCOUNTER — Encounter: Payer: Self-pay | Admitting: Family Medicine

## 2019-09-04 DIAGNOSIS — R195 Other fecal abnormalities: Secondary | ICD-10-CM

## 2019-09-04 DIAGNOSIS — I10 Essential (primary) hypertension: Secondary | ICD-10-CM

## 2019-09-04 DIAGNOSIS — R7303 Prediabetes: Secondary | ICD-10-CM | POA: Insufficient documentation

## 2019-09-04 LAB — CBC WITH DIFFERENTIAL/PLATELET
Absolute Monocytes: 725 cells/uL (ref 200–950)
Basophils Absolute: 74 cells/uL (ref 0–200)
Basophils Relative: 0.8 %
Eosinophils Absolute: 74 cells/uL (ref 15–500)
Eosinophils Relative: 0.8 %
HCT: 40.9 % (ref 35.0–45.0)
Hemoglobin: 13.2 g/dL (ref 11.7–15.5)
Lymphs Abs: 2651 cells/uL (ref 850–3900)
MCH: 29.7 pg (ref 27.0–33.0)
MCHC: 32.3 g/dL (ref 32.0–36.0)
MCV: 91.9 fL (ref 80.0–100.0)
MPV: 12.2 fL (ref 7.5–12.5)
Monocytes Relative: 7.8 %
Neutro Abs: 5775 cells/uL (ref 1500–7800)
Neutrophils Relative %: 62.1 %
Platelets: 200 10*3/uL (ref 140–400)
RBC: 4.45 10*6/uL (ref 3.80–5.10)
RDW: 12.8 % (ref 11.0–15.0)
Total Lymphocyte: 28.5 %
WBC: 9.3 10*3/uL (ref 3.8–10.8)

## 2019-09-04 LAB — COMPREHENSIVE METABOLIC PANEL
AG Ratio: 2.1 (calc) (ref 1.0–2.5)
ALT: 9 U/L (ref 6–29)
AST: 13 U/L (ref 10–35)
Albumin: 4 g/dL (ref 3.6–5.1)
Alkaline phosphatase (APISO): 62 U/L (ref 37–153)
BUN/Creatinine Ratio: 32 (calc) — ABNORMAL HIGH (ref 6–22)
BUN: 32 mg/dL — ABNORMAL HIGH (ref 7–25)
CO2: 31 mmol/L (ref 20–32)
Calcium: 9.5 mg/dL (ref 8.6–10.4)
Chloride: 104 mmol/L (ref 98–110)
Creat: 1.01 mg/dL — ABNORMAL HIGH (ref 0.50–0.99)
Globulin: 1.9 g/dL (calc) (ref 1.9–3.7)
Glucose, Bld: 89 mg/dL (ref 65–99)
Potassium: 4.6 mmol/L (ref 3.5–5.3)
Sodium: 141 mmol/L (ref 135–146)
Total Bilirubin: 0.5 mg/dL (ref 0.2–1.2)
Total Protein: 5.9 g/dL — ABNORMAL LOW (ref 6.1–8.1)

## 2019-09-04 LAB — LIPID PANEL
Cholesterol: 171 mg/dL (ref <200)
HDL: 60 mg/dL (ref >=50)
LDL Cholesterol (Calc): 95 mg/dL (calc)
Non-HDL Cholesterol (Calc): 111 mg/dL (calc) (ref <130)
Total CHOL/HDL Ratio: 2.9 (calc) (ref <5.0)
Triglycerides: 73 mg/dL (ref <150)

## 2019-09-04 LAB — THYROID PANEL WITH TSH
Free Thyroxine Index: 2.7 (ref 1.4–3.8)
T3 Uptake: 30 % (ref 22–35)
T4, Total: 8.9 ug/dL (ref 5.1–11.9)
TSH: 1.98 mIU/L (ref 0.40–4.50)

## 2019-09-04 LAB — HEMOGLOBIN A1C
Hgb A1c MFr Bld: 5.7 % of total Hgb — ABNORMAL HIGH (ref <5.7)
Mean Plasma Glucose: 117 (calc)
eAG (mmol/L): 6.5 (calc)

## 2019-09-04 NOTE — Progress Notes (Signed)
Requested copies of her labs be mailed to her house.  Reviewed results with patient, aware and will repeat labs in August at her next follow up appointment.  Aware of positive Cologuard results, will schedule colonoscopy.  States was hesitant to complete colonoscopy because she had a procedure with versed in the past but she remembers everything from it, very afraid the colonoscopy would be the same.  Let patient know she should discuss with gastroenterology so they can be aware and make some medication choices with her.  Pt in agreement.

## 2019-09-04 NOTE — Progress Notes (Signed)
Labs showing some dehydration.  Will plan to repeat in 3 months with increasing water intake daily.  Prediabetes on blood work at 5.7%.  Cologuard Positive.  Will call patient to discuss.

## 2019-09-07 ENCOUNTER — Telehealth: Payer: Self-pay

## 2019-09-07 NOTE — Telephone Encounter (Signed)
Open in error

## 2019-09-17 ENCOUNTER — Ambulatory Visit (INDEPENDENT_AMBULATORY_CARE_PROVIDER_SITE_OTHER): Payer: Self-pay | Admitting: Gastroenterology

## 2019-09-17 ENCOUNTER — Other Ambulatory Visit: Payer: Self-pay

## 2019-09-17 DIAGNOSIS — R195 Other fecal abnormalities: Secondary | ICD-10-CM

## 2019-09-17 DIAGNOSIS — M549 Dorsalgia, unspecified: Secondary | ICD-10-CM | POA: Insufficient documentation

## 2019-09-17 NOTE — Progress Notes (Signed)
Gastroenterology Pre-Procedure Review  Request Date: 09/24/19 Requesting Physician: Dr. Allen Norris  PATIENT REVIEW QUESTIONS: The patient responded to the following health history questions as indicated:    1. Are you having any GI issues? no 2. Do you have a personal history of Polyps? no 3. Do you have a family history of Colon Cancer or Polyps? no 4. Diabetes Mellitus? no 5. Joint replacements in the past 12 months?no 6. Major health problems in the past 3 months?yes (arthritis) 7. Any artificial heart valves, MVP, or defibrillator?no    MEDICATIONS & ALLERGIES:    Patient reports the following regarding taking any anticoagulation/antiplatelet therapy:   Plavix, Coumadin, Eliquis, Xarelto, Lovenox, Pradaxa, Brilinta, or Effient? no Aspirin? no  Patient confirms/reports the following medications:  Current Outpatient Medications  Medication Sig Dispense Refill  . albuterol (VENTOLIN HFA) 108 (90 Base) MCG/ACT inhaler Inhale 2 puffs into the lungs every 6 (six) hours as needed for wheezing or shortness of breath. 8.5 g 1  . Cholecalciferol (D3 SUPER STRENGTH) 50 MCG (2000 UT) CAPS Take by mouth.    . citalopram (CELEXA) 20 MG tablet Take 1 tablet by mouth once daily 90 tablet 0  . gabapentin (NEURONTIN) 300 MG capsule Take 300 mg by mouth as directed. One capsule in the morning and two capsules every day at bedtime    . HYDROcodone-acetaminophen (NORCO) 7.5-325 MG tablet 1/2-1 po q day prn    . lisinopril-hydrochlorothiazide (ZESTORETIC) 20-25 MG tablet Take 1 tablet by mouth once daily 90 tablet 0  . Magnesium 500 MG TABS Take 1 tablet by mouth daily.    . meloxicam (MOBIC) 15 MG tablet Take 1 tablet by mouth once daily    . predniSONE (DELTASONE) 10 MG tablet     . traMADol (ULTRAM) 50 MG tablet Take 50 mg by mouth 3 (three) times daily as needed.    . Turmeric 1053 MG TABS Take by mouth.     No current facility-administered medications for this visit.    Patient confirms/reports  the following allergies:  Allergies  Allergen Reactions  . Penicillins Rash    No orders of the defined types were placed in this encounter.   AUTHORIZATION INFORMATION Primary Insurance: 1D#: Group #:  Secondary Insurance: 1D#: Group #:  SCHEDULE INFORMATION: Date: Thursday 09/24/19 Time: [place]  Patient is very anxious about positive cologuard test.  Concerned about the anesthesia will not be effective during her colonoscopy.  Would like to discuss this with anesthesiologist also.  I contacted the Endoscopy Dept to make them aware.  Thanks,  [place], [place]

## 2019-09-22 ENCOUNTER — Other Ambulatory Visit: Payer: Self-pay

## 2019-09-22 ENCOUNTER — Other Ambulatory Visit
Admission: RE | Admit: 2019-09-22 | Discharge: 2019-09-22 | Disposition: A | Discharge status: Home / Self Care | Payer: PPO | Source: Ambulatory Visit | Attending: Gastroenterology | Admitting: Gastroenterology

## 2019-09-22 DIAGNOSIS — Z20822 Contact with and (suspected) exposure to covid-19: Secondary | ICD-10-CM | POA: Diagnosis not present

## 2019-09-22 DIAGNOSIS — Z01812 Encounter for preprocedural laboratory examination: Secondary | ICD-10-CM | POA: Insufficient documentation

## 2019-09-22 LAB — SARS CORONAVIRUS 2 (TAT 6-24 HRS): SARS Coronavirus 2: NEGATIVE

## 2019-09-23 ENCOUNTER — Encounter: Payer: Self-pay | Admitting: Gastroenterology

## 2019-09-24 ENCOUNTER — Ambulatory Visit: Payer: PPO | Admitting: Certified Registered"

## 2019-09-24 ENCOUNTER — Other Ambulatory Visit: Payer: Self-pay

## 2019-09-24 ENCOUNTER — Encounter: Payer: Self-pay | Admitting: Gastroenterology

## 2019-09-24 ENCOUNTER — Ambulatory Visit
Admission: RE | Admit: 2019-09-24 | Discharge: 2019-09-24 | Disposition: A | Discharge status: Home / Self Care | Payer: PPO | Attending: Gastroenterology | Admitting: Gastroenterology

## 2019-09-24 ENCOUNTER — Encounter
Admission: RE | Disposition: A | Discharge status: Home / Self Care | Payer: Self-pay | Source: Home / Self Care | Attending: Gastroenterology

## 2019-09-24 DIAGNOSIS — K635 Polyp of colon: Secondary | ICD-10-CM | POA: Diagnosis not present

## 2019-09-24 DIAGNOSIS — Z79899 Other long term (current) drug therapy: Secondary | ICD-10-CM | POA: Insufficient documentation

## 2019-09-24 DIAGNOSIS — D123 Benign neoplasm of transverse colon: Secondary | ICD-10-CM | POA: Insufficient documentation

## 2019-09-24 DIAGNOSIS — M1990 Unspecified osteoarthritis, unspecified site: Secondary | ICD-10-CM | POA: Diagnosis not present

## 2019-09-24 DIAGNOSIS — R195 Other fecal abnormalities: Secondary | ICD-10-CM | POA: Diagnosis not present

## 2019-09-24 DIAGNOSIS — F419 Anxiety disorder, unspecified: Secondary | ICD-10-CM | POA: Diagnosis not present

## 2019-09-24 DIAGNOSIS — I10 Essential (primary) hypertension: Secondary | ICD-10-CM | POA: Insufficient documentation

## 2019-09-24 DIAGNOSIS — K640 First degree hemorrhoids: Secondary | ICD-10-CM | POA: Diagnosis not present

## 2019-09-24 DIAGNOSIS — F329 Major depressive disorder, single episode, unspecified: Secondary | ICD-10-CM | POA: Insufficient documentation

## 2019-09-24 DIAGNOSIS — Z791 Long term (current) use of non-steroidal anti-inflammatories (NSAID): Secondary | ICD-10-CM | POA: Diagnosis not present

## 2019-09-24 DIAGNOSIS — G709 Myoneural disorder, unspecified: Secondary | ICD-10-CM | POA: Diagnosis not present

## 2019-09-24 HISTORY — DX: Unspecified osteoarthritis, unspecified site: M19.90

## 2019-09-24 HISTORY — PX: COLONOSCOPY WITH PROPOFOL: SHX5780

## 2019-09-24 HISTORY — DX: Anxiety disorder, unspecified: F41.9

## 2019-09-24 HISTORY — DX: Depression, unspecified: F32.A

## 2019-09-24 SURGERY — COLONOSCOPY WITH PROPOFOL
Anesthesia: General

## 2019-09-24 MED ORDER — FENTANYL CITRATE (PF) 100 MCG/2ML IJ SOLN
25.0000 ug | INTRAMUSCULAR | Status: DC | PRN
  Administered 2019-09-24: 50 ug via INTRAVENOUS

## 2019-09-24 MED ORDER — MIDAZOLAM HCL 2 MG/2ML IJ SOLN
INTRAMUSCULAR | Status: DC | PRN
  Administered 2019-09-24 (×2): 1 mg via INTRAVENOUS

## 2019-09-24 MED ORDER — MIDAZOLAM HCL 2 MG/2ML IJ SOLN
INTRAMUSCULAR | Status: AC
  Filled 2019-09-24: qty 2

## 2019-09-24 MED ORDER — ONDANSETRON HCL 4 MG/2ML IJ SOLN: 4.0000 mg | Freq: Once | INTRAMUSCULAR | Status: DC | PRN

## 2019-09-24 MED ORDER — LIDOCAINE HCL (PF) 2 % IJ SOLN
INTRAMUSCULAR | Status: AC
  Filled 2019-09-24: qty 5

## 2019-09-24 MED ORDER — SODIUM CHLORIDE 0.9 % IV SOLN
INTRAVENOUS | Status: DC
  Administered 2019-09-24: 1000 mL via INTRAVENOUS

## 2019-09-24 MED ORDER — PROPOFOL 500 MG/50ML IV EMUL
INTRAVENOUS | Status: DC | PRN
  Administered 2019-09-24: 135 ug/kg/min via INTRAVENOUS

## 2019-09-24 MED ORDER — PROPOFOL 10 MG/ML IV BOLUS
INTRAVENOUS | Status: DC | PRN
  Administered 2019-09-24: 10 mg via INTRAVENOUS

## 2019-09-24 MED ORDER — GLYCOPYRROLATE 0.2 MG/ML IJ SOLN
INTRAMUSCULAR | Status: AC
  Filled 2019-09-24: qty 1

## 2019-09-24 MED ORDER — SODIUM CHLORIDE 0.9 % IV SOLN: INTRAVENOUS | Status: DC | PRN

## 2019-09-24 MED ORDER — LIDOCAINE HCL (CARDIAC) PF 100 MG/5ML IV SOSY
PREFILLED_SYRINGE | INTRAVENOUS | Status: DC | PRN
  Administered 2019-09-24: 100 mg via INTRATRACHEAL

## 2019-09-24 MED ORDER — FENTANYL CITRATE (PF) 100 MCG/2ML IJ SOLN
INTRAMUSCULAR | Status: AC
  Filled 2019-09-24: qty 2

## 2019-09-24 NOTE — Op Note (Signed)
[facility]enter[facility] Patient Name: Linda Farley Procedure Date: 09/24/2019 1:39 PM MRN: [id number] Account #: [id number] Date of Birth: 1950-08-02 Admit Type: Outpatient Age: 69 Room: [facility] ENDO ROOM 1 Gender: Female Note Status: Finalized Procedure:             Colonoscopy Indications:           Positive Cologuard test Providers:             Lucilla Lame MD, MD Referring MD:          [name]., FNP Medicines:             Propofol per Anesthesia Complications:         No immediate complications. Procedure:             Pre-Anesthesia Assessment:                        - Prior to the procedure, a History and Physical was                         performed, and patient medications and allergies were                         reviewed. The patient's tolerance of previous                         anesthesia was also reviewed. The risks and benefits                         of the procedure and the sedation options and risks                         were discussed with the patient. All questions were                         answered, and informed consent was obtained. Prior                         Anticoagulants: The patient has taken no previous                         anticoagulant or antiplatelet agents. ASA Grade                         Assessment: II - A patient with mild systemic disease.                         After reviewing the risks and benefits, the patient                         was deemed in satisfactory condition to undergo the                         procedure.                        After obtaining informed consent, the colonoscope was  passed under direct vision. Throughout the procedure,                         the patient's blood pressure, pulse, and oxygen                         saturations were monitored continuously. The                         Colonoscope was introduced through the anus and                          advanced to the the cecum, identified by appendiceal                         orifice and ileocecal valve. The colonoscopy was                         performed without difficulty. The patient tolerated                         the procedure well. The quality of the bowel                         preparation was excellent. Findings:      The perianal and digital rectal examinations were normal.      A 5 mm polyp was found in the transverse colon. The polyp was sessile.       The polyp was removed with a cold snare. Resection and retrieval were       complete.      Non-bleeding internal hemorrhoids were found during retroflexion. The       hemorrhoids were Grade I (internal hemorrhoids that do not prolapse). Impression:            - One 5 mm polyp in the transverse colon, removed with                         a cold snare. Resected and retrieved.                        - Non-bleeding internal hemorrhoids. Recommendation:        - Discharge patient to home.                        - Resume previous diet.                        - Continue present medications.                        - Await pathology results.                        - Repeat colonoscopy in 5 years if polyp adenoma and                         10 years if hyperplastic Procedure Code(s):     --- Professional ---  45385, Colonoscopy, flexible; with removal of                         tumor(s), polyp(s), or other lesion(s) by snare                         technique Diagnosis Code(s):     --- Professional ---                        R19.5, Other fecal abnormalities                        K63.5, Polyp of colon CPT copyright 2019 [employer]. All rights reserved. The codes documented in this report are preliminary and upon coder review may  be revised to meet current compliance requirements. Lucilla Lame MD, MD 09/24/2019 2:12:05 PM This report has been signed electronically. Number of Addenda:  0 Note Initiated On: 09/24/2019 1:39 PM Scope Withdrawal Time: 0 hours 6 minutes 36 seconds  Total Procedure Duration: 0 hours 11 minutes 26 seconds  Estimated Blood Loss:  Estimated blood loss: none.      [facility]

## 2019-09-24 NOTE — Anesthesia Preprocedure Evaluation (Signed)
Anesthesia Evaluation  Patient identified by MRN, date of birth, ID band Patient awake    Reviewed: Allergy & Precautions, NPO status , Patient's Chart, lab work & pertinent test results  Airway Mallampati: III       Dental   Pulmonary neg pulmonary ROS,    Pulmonary exam normal        Cardiovascular hypertension, Normal cardiovascular exam     Neuro/Psych PSYCHIATRIC DISORDERS Depression  Neuromuscular disease    GI/Hepatic negative GI ROS, Neg liver ROS,   Endo/Other  negative endocrine ROS  Renal/GU negative Renal ROS  negative genitourinary   Musculoskeletal  (+) Arthritis , Osteoarthritis,    Abdominal Normal abdominal exam  (+)   Peds negative pediatric ROS (+)  Hematology negative hematology ROS (+)   Anesthesia Other Findings   Reproductive/Obstetrics                             Anesthesia Physical Anesthesia Plan  ASA: II  Anesthesia Plan: General   Post-op Pain Management:    Induction: Intravenous  PONV Risk Score and Plan: Propofol infusion  Airway Management Planned: Nasal Cannula  Additional Equipment:   Intra-op Plan:   Post-operative Plan:   Informed Consent: I have reviewed the patients History and Physical, chart, labs and discussed the procedure including the risks, benefits and alternatives for the proposed anesthesia with the patient or authorized representative who has indicated his/her understanding and acceptance.     Dental advisory given  Plan Discussed with: CRNA and Surgeon  Anesthesia Plan Comments:         Anesthesia Quick Evaluation

## 2019-09-24 NOTE — Anesthesia Postprocedure Evaluation (Signed)
Anesthesia Post Note  Patient: Linda Farley  Procedure(s) Performed: COLONOSCOPY WITH PROPOFOL (N/A )  Patient location during evaluation: Endoscopy Anesthesia Type: General Level of consciousness: awake and alert and oriented Pain management: pain level controlled Vital Signs Assessment: post-procedure vital signs reviewed and stable Respiratory status: spontaneous breathing Cardiovascular status: blood pressure returned to baseline Anesthetic complications: no     Last Vitals:  Vitals:   09/24/19 1434 09/24/19 1444  BP: (!) 145/74 (!) 158/70  Pulse: 66 (!) 58  Resp: 14 13  Temp:    SpO2: 100% 100%    Last Pain:  Vitals:   09/24/19 1444  TempSrc:   PainSc: (P) 0-No pain                 Viktoria Gruetzmacher

## 2019-09-24 NOTE — Transfer of Care (Signed)
Immediate Anesthesia Transfer of Care Note  Patient: Linda Farley  Procedure(s) Performed: COLONOSCOPY WITH PROPOFOL (N/A )  Patient Location: Endoscopy Unit  Anesthesia Type:General  Level of Consciousness: awake and patient cooperative  Airway & Oxygen Therapy: Patient Spontanous Breathing and Patient connected to face mask oxygen  Post-op Assessment: Report given to RN and Post -op Vital signs reviewed and stable  Post vital signs: Reviewed and stable  Last Vitals:  Vitals Value Taken Time  BP    Temp    Pulse 60 09/24/19 1416  Resp 17 09/24/19 1416  SpO2 100 % 09/24/19 1416  Vitals shown include unvalidated device data.  Last Pain:  Vitals:   09/24/19 1414  TempSrc:   PainSc: Asleep      Patients Stated Pain Goal: 0 ([id number])  Complications: No apparent anesthesia complications

## 2019-09-24 NOTE — H&P (Signed)
Linda Lame, MD [address] [place], [place] [address] Phone:[phone] Fax : [phone]  Primary Care Physician:  Linda Bangs, FNP Primary Gastroenterologist:  Dr. Allen Farley  Pre-Procedure History & Physical: HPI:  Linda Farley is a 69 y.o. female is here for an colonoscopy.   Past Medical History:  Diagnosis Date  . Allergy   . Anxiety   . Arthritis   . Depression   . Hypertension     Past Surgical History:  Procedure Laterality Date  . ABDOMINAL HYSTERECTOMY    . APPENDECTOMY    . TONSILLECTOMY      Prior to Admission medications   Medication Sig Start Date End Date Taking? Authorizing Provider  Cholecalciferol (D3 SUPER STRENGTH) 50 MCG (2000 UT) CAPS Take by mouth.   Yes [provider]  citalopram (CELEXA) 20 MG tablet Take 1 tablet by mouth once daily 09/10/19  Yes Malfi, Lupita Raider, FNP  gabapentin (NEURONTIN) 300 MG capsule Take 300 mg by mouth as directed. One capsule in the morning and two capsules every day at bedtime 02/12/19  Yes [provider]  HYDROcodone-acetaminophen ([place]) 7.5-325 MG tablet 1/2-1 po q day prn 07/17/19  Yes [provider]  lisinopril-hydrochlorothiazide (ZESTORETIC) 20-25 MG tablet Take 1 tablet by mouth once daily 09/10/19  Yes Malfi, Lupita Raider, FNP  Magnesium 500 MG TABS Take 1 tablet by mouth daily.   Yes [provider]  meloxicam (MOBIC) 15 MG tablet Take 1 tablet by mouth once daily 07/20/19  Yes [provider]  traMADol (ULTRAM) 50 MG tablet Take 50 mg by mouth 3 (three) times daily as needed. 02/11/19  Yes [provider]  albuterol (VENTOLIN HFA) 108 (90 Base) MCG/ACT inhaler Inhale 2 puffs into the lungs every 6 (six) hours as needed for wheezing or shortness of breath. Patient not taking: Reported on 09/24/2019 08/04/19   Linda Bangs, FNP  predniSONE (DELTASONE) 10 MG tablet  09/16/19   [provider]  Turmeric 1053 MG TABS Take by mouth.    [provider]    Allergies as of 09/17/2019 - Review Complete 09/17/2019  Allergen Reaction Noted  . Penicillins Rash 09/17/2011    Family History  Problem Relation Age of Onset  . Prostate cancer Father   . Bone cancer Brother   . Heart disease Brother     Social History   Socioeconomic History  . Marital status: Married    Spouse name: Not on file  . Number of children: Not on file  . Years of education: Not on file  . Highest education level: Not on file  Occupational History  . Not on file  Tobacco Use  . Smoking status: Never Smoker  . Smokeless tobacco: Never Used  Substance and Sexual Activity  . Alcohol use: Yes    Alcohol/week: 3.0 standard drinks    Types: 3 Glasses of wine per week    Comment: 3 wine coolers weekly  . Drug use: Never  . Sexual activity: Not on file  Other Topics Concern  . Not on file  Social History Narrative  . Not on file   Social Determinants of Health   Financial Resource Strain:   . Difficulty of Paying Living Expenses:   Food Insecurity:   . Worried About Charity fundraiser in the Last Year:   . Arboriculturist in the Last Year:   Transportation Needs:   . Film/video editor (Medical):   Marland Kitchen  Lack of Transportation (Non-Medical):   Physical Activity:   . Days of Exercise per Week:   . Minutes of Exercise per Session:   Stress:   . Feeling of Stress :   Social Connections:   . Frequency of Communication with Friends and Family:   . Frequency of Social Gatherings with Friends and Family:   . Attends Religious Services:   . Active Member of Clubs or Organizations:   . Attends Archivist Meetings:   Marland Kitchen Marital Status:   Intimate Partner Violence:   . Fear of Current or Ex-Partner:   . Emotionally Abused:   Marland Kitchen Physically Abused:   . Sexually Abused:     Review of Systems: See HPI, otherwise negative ROS  Physical Exam: BP (!) 155/101   Pulse (!) 102   Temp (!) 97.2 F (36.2 C) (Temporal)   Resp 20    Ht 5' 5.5" (1.664 m)   Wt 85.3 kg   SpO2 99%   BMI 30.81 kg/m  General:   Alert,  pleasant and cooperative in NAD Head:  Normocephalic and atraumatic. Neck:  Supple; no masses or thyromegaly. Lungs:  Clear throughout to auscultation.    Heart:  Regular rate and rhythm. Abdomen:  Soft, nontender and nondistended. Normal bowel sounds, without guarding, and without rebound.   Neurologic:  Alert and  oriented x4;  grossly normal neurologically.  Impression/Plan: Linda Farley is here for an colonoscopy to be performed for positive cologaurd  Risks, benefits, limitations, and alternatives regarding  colonoscopy have been reviewed with the patient.  Questions have been answered.  All parties agreeable.   Linda Lame, MD  09/24/2019, 1:42 PM

## 2019-09-25 ENCOUNTER — Encounter: Payer: Self-pay | Admitting: *Deleted

## 2019-09-25 MED ORDER — ONDANSETRON HCL 4 MG/2ML IJ SOLN
INTRAMUSCULAR | Status: AC
  Filled 2019-09-25: qty 2

## 2019-09-25 MED ORDER — GLYCOPYRROLATE 0.2 MG/ML IJ SOLN
INTRAMUSCULAR | Status: AC
  Filled 2019-09-25: qty 4

## 2019-09-25 MED ORDER — LIDOCAINE HCL (PF) 2 % IJ SOLN
INTRAMUSCULAR | Status: AC
  Filled 2019-09-25: qty 20

## 2019-09-25 MED ORDER — EPHEDRINE 5 MG/ML INJ
INTRAVENOUS | Status: AC
  Filled 2019-09-25: qty 10

## 2019-09-28 LAB — SURGICAL PATHOLOGY

## 2019-09-29 ENCOUNTER — Encounter: Payer: Self-pay | Admitting: Gastroenterology

## 2019-10-07 DIAGNOSIS — M5412 Radiculopathy, cervical region: Secondary | ICD-10-CM | POA: Diagnosis not present

## 2019-10-07 DIAGNOSIS — M7551 Bursitis of right shoulder: Secondary | ICD-10-CM | POA: Diagnosis not present

## 2019-10-07 DIAGNOSIS — M5416 Radiculopathy, lumbar region: Secondary | ICD-10-CM | POA: Diagnosis not present

## 2019-10-07 DIAGNOSIS — M62830 Muscle spasm of back: Secondary | ICD-10-CM | POA: Diagnosis not present

## 2019-10-07 DIAGNOSIS — M19011 Primary osteoarthritis, right shoulder: Secondary | ICD-10-CM | POA: Diagnosis not present

## 2019-10-07 DIAGNOSIS — M7552 Bursitis of left shoulder: Secondary | ICD-10-CM | POA: Diagnosis not present

## 2019-10-07 DIAGNOSIS — M48062 Spinal stenosis, lumbar region with neurogenic claudication: Secondary | ICD-10-CM | POA: Diagnosis not present

## 2019-10-07 DIAGNOSIS — M19012 Primary osteoarthritis, left shoulder: Secondary | ICD-10-CM | POA: Diagnosis not present

## 2019-10-21 ENCOUNTER — Other Ambulatory Visit: Payer: Self-pay | Admitting: Family Medicine

## 2019-10-21 DIAGNOSIS — J45998 Other asthma: Secondary | ICD-10-CM

## 2019-11-04 DIAGNOSIS — M19011 Primary osteoarthritis, right shoulder: Secondary | ICD-10-CM | POA: Diagnosis not present

## 2019-11-04 DIAGNOSIS — M48062 Spinal stenosis, lumbar region with neurogenic claudication: Secondary | ICD-10-CM | POA: Diagnosis not present

## 2019-11-04 DIAGNOSIS — M5136 Other intervertebral disc degeneration, lumbar region: Secondary | ICD-10-CM | POA: Diagnosis not present

## 2019-11-04 DIAGNOSIS — M19012 Primary osteoarthritis, left shoulder: Secondary | ICD-10-CM | POA: Diagnosis not present

## 2019-11-04 DIAGNOSIS — M7552 Bursitis of left shoulder: Secondary | ICD-10-CM | POA: Diagnosis not present

## 2019-11-04 DIAGNOSIS — M1611 Unilateral primary osteoarthritis, right hip: Secondary | ICD-10-CM | POA: Diagnosis not present

## 2019-11-04 DIAGNOSIS — M7551 Bursitis of right shoulder: Secondary | ICD-10-CM | POA: Diagnosis not present

## 2019-11-04 DIAGNOSIS — M5416 Radiculopathy, lumbar region: Secondary | ICD-10-CM | POA: Diagnosis not present

## 2019-11-11 DIAGNOSIS — M1712 Unilateral primary osteoarthritis, left knee: Secondary | ICD-10-CM | POA: Diagnosis not present

## 2019-11-11 DIAGNOSIS — M25561 Pain in right knee: Secondary | ICD-10-CM | POA: Diagnosis not present

## 2019-11-11 DIAGNOSIS — M1711 Unilateral primary osteoarthritis, right knee: Secondary | ICD-10-CM | POA: Diagnosis not present

## 2019-11-11 DIAGNOSIS — M1611 Unilateral primary osteoarthritis, right hip: Secondary | ICD-10-CM | POA: Diagnosis not present

## 2019-11-11 DIAGNOSIS — M25562 Pain in left knee: Secondary | ICD-10-CM | POA: Diagnosis not present

## 2019-11-24 ENCOUNTER — Other Ambulatory Visit: Payer: Self-pay | Admitting: Family Medicine

## 2019-11-24 DIAGNOSIS — R5382 Chronic fatigue, unspecified: Secondary | ICD-10-CM

## 2019-11-24 NOTE — Progress Notes (Signed)
Updated order for sleep study placed.

## 2019-12-07 ENCOUNTER — Other Ambulatory Visit: Payer: Self-pay | Admitting: Family Medicine

## 2019-12-07 DIAGNOSIS — I10 Essential (primary) hypertension: Secondary | ICD-10-CM

## 2019-12-14 ENCOUNTER — Encounter: Payer: Self-pay | Admitting: Emergency Medicine

## 2019-12-14 ENCOUNTER — Emergency Department: Payer: PPO

## 2019-12-14 ENCOUNTER — Inpatient Hospital Stay
Admission: EM | Admit: 2019-12-14 | Discharge: 2019-12-23 | DRG: 469 | Disposition: A | Discharge status: Skilled Nursing Facility | Payer: PPO | Attending: Internal Medicine | Admitting: Internal Medicine

## 2019-12-14 ENCOUNTER — Other Ambulatory Visit: Payer: Self-pay

## 2019-12-14 DIAGNOSIS — M5459 Other low back pain: Secondary | ICD-10-CM | POA: Diagnosis present

## 2019-12-14 DIAGNOSIS — Q211 Atrial septal defect: Secondary | ICD-10-CM

## 2019-12-14 DIAGNOSIS — R079 Chest pain, unspecified: Secondary | ICD-10-CM | POA: Diagnosis not present

## 2019-12-14 DIAGNOSIS — R41841 Cognitive communication deficit: Secondary | ICD-10-CM | POA: Diagnosis not present

## 2019-12-14 DIAGNOSIS — M7989 Other specified soft tissue disorders: Secondary | ICD-10-CM

## 2019-12-14 DIAGNOSIS — Z90710 Acquired absence of both cervix and uterus: Secondary | ICD-10-CM | POA: Diagnosis not present

## 2019-12-14 DIAGNOSIS — I340 Nonrheumatic mitral (valve) insufficiency: Secondary | ICD-10-CM | POA: Diagnosis not present

## 2019-12-14 DIAGNOSIS — M48061 Spinal stenosis, lumbar region without neurogenic claudication: Secondary | ICD-10-CM | POA: Diagnosis present

## 2019-12-14 DIAGNOSIS — H547 Unspecified visual loss: Secondary | ICD-10-CM | POA: Diagnosis present

## 2019-12-14 DIAGNOSIS — M79604 Pain in right leg: Secondary | ICD-10-CM | POA: Diagnosis not present

## 2019-12-14 DIAGNOSIS — I10 Essential (primary) hypertension: Secondary | ICD-10-CM | POA: Diagnosis present

## 2019-12-14 DIAGNOSIS — I361 Nonrheumatic tricuspid (valve) insufficiency: Secondary | ICD-10-CM | POA: Diagnosis not present

## 2019-12-14 DIAGNOSIS — Z79891 Long term (current) use of opiate analgesic: Secondary | ICD-10-CM

## 2019-12-14 DIAGNOSIS — Z23 Encounter for immunization: Secondary | ICD-10-CM | POA: Diagnosis present

## 2019-12-14 DIAGNOSIS — M4316 Spondylolisthesis, lumbar region: Secondary | ICD-10-CM | POA: Diagnosis present

## 2019-12-14 DIAGNOSIS — Z880 Allergy status to penicillin: Secondary | ICD-10-CM | POA: Diagnosis not present

## 2019-12-14 DIAGNOSIS — G629 Polyneuropathy, unspecified: Secondary | ICD-10-CM | POA: Diagnosis present

## 2019-12-14 DIAGNOSIS — R000 Tachycardia, unspecified: Secondary | ICD-10-CM | POA: Diagnosis not present

## 2019-12-14 DIAGNOSIS — G43909 Migraine, unspecified, not intractable, without status migrainosus: Secondary | ICD-10-CM | POA: Diagnosis present

## 2019-12-14 DIAGNOSIS — S72051A Unspecified fracture of head of right femur, initial encounter for closed fracture: Secondary | ICD-10-CM | POA: Diagnosis present

## 2019-12-14 DIAGNOSIS — G8929 Other chronic pain: Secondary | ICD-10-CM | POA: Diagnosis present

## 2019-12-14 DIAGNOSIS — Z791 Long term (current) use of non-steroidal anti-inflammatories (NSAID): Secondary | ICD-10-CM

## 2019-12-14 DIAGNOSIS — Z8249 Family history of ischemic heart disease and other diseases of the circulatory system: Secondary | ICD-10-CM | POA: Diagnosis not present

## 2019-12-14 DIAGNOSIS — Z20822 Contact with and (suspected) exposure to covid-19: Secondary | ICD-10-CM | POA: Diagnosis present

## 2019-12-14 DIAGNOSIS — I4510 Unspecified right bundle-branch block: Secondary | ICD-10-CM | POA: Diagnosis present

## 2019-12-14 DIAGNOSIS — Z8042 Family history of malignant neoplasm of prostate: Secondary | ICD-10-CM

## 2019-12-14 DIAGNOSIS — M79605 Pain in left leg: Secondary | ICD-10-CM | POA: Diagnosis not present

## 2019-12-14 DIAGNOSIS — F419 Anxiety disorder, unspecified: Secondary | ICD-10-CM | POA: Diagnosis present

## 2019-12-14 DIAGNOSIS — Z7952 Long term (current) use of systemic steroids: Secondary | ICD-10-CM

## 2019-12-14 DIAGNOSIS — F418 Other specified anxiety disorders: Secondary | ICD-10-CM | POA: Diagnosis not present

## 2019-12-14 DIAGNOSIS — F329 Major depressive disorder, single episode, unspecified: Secondary | ICD-10-CM | POA: Diagnosis present

## 2019-12-14 DIAGNOSIS — Z96641 Presence of right artificial hip joint: Secondary | ICD-10-CM | POA: Diagnosis not present

## 2019-12-14 DIAGNOSIS — M87051 Idiopathic aseptic necrosis of right femur: Secondary | ICD-10-CM | POA: Diagnosis not present

## 2019-12-14 DIAGNOSIS — Z79899 Other long term (current) drug therapy: Secondary | ICD-10-CM | POA: Diagnosis not present

## 2019-12-14 DIAGNOSIS — M19011 Primary osteoarthritis, right shoulder: Secondary | ICD-10-CM | POA: Diagnosis not present

## 2019-12-14 DIAGNOSIS — S79911A Unspecified injury of right hip, initial encounter: Secondary | ICD-10-CM | POA: Diagnosis not present

## 2019-12-14 DIAGNOSIS — I82443 Acute embolism and thrombosis of tibial vein, bilateral: Secondary | ICD-10-CM | POA: Diagnosis not present

## 2019-12-14 DIAGNOSIS — R509 Fever, unspecified: Secondary | ICD-10-CM | POA: Diagnosis not present

## 2019-12-14 DIAGNOSIS — M2550 Pain in unspecified joint: Secondary | ICD-10-CM | POA: Diagnosis not present

## 2019-12-14 DIAGNOSIS — G9340 Encephalopathy, unspecified: Secondary | ICD-10-CM | POA: Diagnosis not present

## 2019-12-14 DIAGNOSIS — M5116 Intervertebral disc disorders with radiculopathy, lumbar region: Secondary | ICD-10-CM | POA: Diagnosis present

## 2019-12-14 DIAGNOSIS — I639 Cerebral infarction, unspecified: Secondary | ICD-10-CM | POA: Diagnosis not present

## 2019-12-14 DIAGNOSIS — R21 Rash and other nonspecific skin eruption: Secondary | ICD-10-CM | POA: Diagnosis not present

## 2019-12-14 DIAGNOSIS — I6521 Occlusion and stenosis of right carotid artery: Secondary | ICD-10-CM | POA: Diagnosis not present

## 2019-12-14 DIAGNOSIS — G8918 Other acute postprocedural pain: Secondary | ICD-10-CM

## 2019-12-14 DIAGNOSIS — M25551 Pain in right hip: Secondary | ICD-10-CM | POA: Diagnosis not present

## 2019-12-14 DIAGNOSIS — D62 Acute posthemorrhagic anemia: Secondary | ICD-10-CM | POA: Diagnosis not present

## 2019-12-14 DIAGNOSIS — H5347 Heteronymous bilateral field defects: Secondary | ICD-10-CM | POA: Diagnosis present

## 2019-12-14 DIAGNOSIS — I82441 Acute embolism and thrombosis of right tibial vein: Secondary | ICD-10-CM | POA: Diagnosis present

## 2019-12-14 DIAGNOSIS — M1611 Unilateral primary osteoarthritis, right hip: Secondary | ICD-10-CM | POA: Diagnosis present

## 2019-12-14 DIAGNOSIS — J9811 Atelectasis: Secondary | ICD-10-CM | POA: Diagnosis not present

## 2019-12-14 DIAGNOSIS — Z419 Encounter for procedure for purposes other than remedying health state, unspecified: Secondary | ICD-10-CM

## 2019-12-14 DIAGNOSIS — Z7982 Long term (current) use of aspirin: Secondary | ICD-10-CM

## 2019-12-14 DIAGNOSIS — R519 Headache, unspecified: Secondary | ICD-10-CM | POA: Diagnosis not present

## 2019-12-14 DIAGNOSIS — M545 Low back pain: Secondary | ICD-10-CM | POA: Diagnosis not present

## 2019-12-14 DIAGNOSIS — M6281 Muscle weakness (generalized): Secondary | ICD-10-CM | POA: Diagnosis not present

## 2019-12-14 DIAGNOSIS — M25451 Effusion, right hip: Secondary | ICD-10-CM | POA: Diagnosis not present

## 2019-12-14 DIAGNOSIS — R002 Palpitations: Secondary | ICD-10-CM | POA: Diagnosis not present

## 2019-12-14 DIAGNOSIS — I82890 Acute embolism and thrombosis of other specified veins: Secondary | ICD-10-CM | POA: Diagnosis not present

## 2019-12-14 DIAGNOSIS — R2689 Other abnormalities of gait and mobility: Secondary | ICD-10-CM | POA: Diagnosis not present

## 2019-12-14 DIAGNOSIS — S2241XD Multiple fractures of ribs, right side, subsequent encounter for fracture with routine healing: Secondary | ICD-10-CM | POA: Diagnosis not present

## 2019-12-14 DIAGNOSIS — R262 Difficulty in walking, not elsewhere classified: Secondary | ICD-10-CM | POA: Diagnosis not present

## 2019-12-14 DIAGNOSIS — R296 Repeated falls: Secondary | ICD-10-CM | POA: Diagnosis not present

## 2019-12-14 DIAGNOSIS — M5416 Radiculopathy, lumbar region: Secondary | ICD-10-CM

## 2019-12-14 DIAGNOSIS — M5127 Other intervertebral disc displacement, lumbosacral region: Secondary | ICD-10-CM | POA: Diagnosis not present

## 2019-12-14 DIAGNOSIS — R0602 Shortness of breath: Secondary | ICD-10-CM | POA: Diagnosis present

## 2019-12-14 DIAGNOSIS — Z7401 Bed confinement status: Secondary | ICD-10-CM | POA: Diagnosis not present

## 2019-12-14 DIAGNOSIS — Z736 Limitation of activities due to disability: Secondary | ICD-10-CM | POA: Diagnosis not present

## 2019-12-14 DIAGNOSIS — H532 Diplopia: Secondary | ICD-10-CM | POA: Diagnosis not present

## 2019-12-14 DIAGNOSIS — I959 Hypotension, unspecified: Secondary | ICD-10-CM | POA: Diagnosis not present

## 2019-12-14 DIAGNOSIS — D649 Anemia, unspecified: Secondary | ICD-10-CM | POA: Diagnosis not present

## 2019-12-14 DIAGNOSIS — R278 Other lack of coordination: Secondary | ICD-10-CM | POA: Diagnosis not present

## 2019-12-14 DIAGNOSIS — M47816 Spondylosis without myelopathy or radiculopathy, lumbar region: Secondary | ICD-10-CM | POA: Diagnosis not present

## 2019-12-14 DIAGNOSIS — I6529 Occlusion and stenosis of unspecified carotid artery: Secondary | ICD-10-CM | POA: Diagnosis present

## 2019-12-14 DIAGNOSIS — S3992XA Unspecified injury of lower back, initial encounter: Secondary | ICD-10-CM | POA: Diagnosis not present

## 2019-12-14 DIAGNOSIS — Z471 Aftercare following joint replacement surgery: Secondary | ICD-10-CM | POA: Diagnosis not present

## 2019-12-14 DIAGNOSIS — Z9181 History of falling: Secondary | ICD-10-CM | POA: Diagnosis not present

## 2019-12-14 LAB — COMPREHENSIVE METABOLIC PANEL
ALT: 12 U/L (ref 0–44)
AST: 17 U/L (ref 15–41)
Albumin: 3.9 g/dL (ref 3.5–5.0)
Alkaline Phosphatase: 87 U/L (ref 38–126)
Anion gap: 11 (ref 5–15)
BUN: 29 mg/dL — ABNORMAL HIGH (ref 8–23)
CO2: 27 mmol/L (ref 22–32)
Calcium: 9.2 mg/dL (ref 8.9–10.3)
Chloride: 102 mmol/L (ref 98–111)
Creatinine, Ser: 1.08 mg/dL — ABNORMAL HIGH (ref 0.44–1.00)
GFR calc Af Amer: >60 mL/min (ref >60)
GFR calc non Af Amer: 52 mL/min — ABNORMAL LOW (ref >60)
Glucose, Bld: 95 mg/dL (ref 70–99)
Potassium: 3.9 mmol/L (ref 3.5–5.1)
Sodium: 140 mmol/L (ref 135–145)
Total Bilirubin: 0.9 mg/dL (ref 0.3–1.2)
Total Protein: 6.8 g/dL (ref 6.5–8.1)

## 2019-12-14 LAB — URINALYSIS, COMPLETE (UACMP) WITH MICROSCOPIC
Bilirubin Urine: NEGATIVE
Glucose, UA: NEGATIVE mg/dL
Hgb urine dipstick: NEGATIVE
Ketones, ur: 20 mg/dL — AB
Leukocytes,Ua: NEGATIVE
Nitrite: NEGATIVE
Protein, ur: NEGATIVE mg/dL
Specific Gravity, Urine: 1.018 (ref 1.005–1.030)
pH: 6 (ref 5.0–8.0)

## 2019-12-14 LAB — CBC
HCT: 41.7 % (ref 36.0–46.0)
Hemoglobin: 13.7 g/dL (ref 12.0–15.0)
MCH: 30.2 pg (ref 26.0–34.0)
MCHC: 32.9 g/dL (ref 30.0–36.0)
MCV: 92.1 fL (ref 80.0–100.0)
Platelets: 224 10*3/uL (ref 150–400)
RBC: 4.53 MIL/uL (ref 3.87–5.11)
RDW: 14.3 % (ref 11.5–15.5)
WBC: 10 10*3/uL (ref 4.0–10.5)
nRBC: 0 % (ref 0.0–0.2)

## 2019-12-14 LAB — TROPONIN I (HIGH SENSITIVITY)
Troponin I (High Sensitivity): 5 ng/L (ref <18)
Troponin I (High Sensitivity): 4 ng/L (ref <18)

## 2019-12-14 MED ORDER — DEXAMETHASONE SODIUM PHOSPHATE 10 MG/ML IJ SOLN
10.0000 mg | Freq: Once | INTRAMUSCULAR | Status: AC
  Administered 2019-12-15: 10 mg via INTRAVENOUS
  Filled 2019-12-14: qty 1

## 2019-12-14 MED ORDER — OXYCODONE-ACETAMINOPHEN 5-325 MG PO TABS
1.0000 | ORAL_TABLET | ORAL | Status: AC | PRN
  Administered 2019-12-14 (×2): 1 via ORAL
  Filled 2019-12-14 (×2): qty 1

## 2019-12-14 NOTE — ED Triage Notes (Addendum)
Pt here for right hip pain. Is supposed to have replacement with dr [name]. [place] [facility] one week ago.  Legs have been weak. Now having pain in left leg/hip as well.  Today started feeling SHOB; does have some mild tightness in chest with exertion.  Unlabored currently.  VSS.  Also starting to have pain in shoulder from using cane. Pt has been unable to bear weight on right leg r/t pain; has been using "pee cup".  Hx DVT. BLE swelling.

## 2019-12-14 NOTE — ED Provider Notes (Signed)
[facility] Emergency Department Provider Note  ____________________________________________   First MD Initiated Contact with Patient 12/14/19 2211     (approximate)  I have reviewed the triage vital signs and the nursing notes.   HISTORY  Chief Complaint Weakness and Shortness of Breath    HPI Linda Farley is a 69 y.o. female  With h/o HTN, HLD, here with hip pain and fall. Pt has known, severe DDD/DJD and R hip OA, currently scheduled to see Dr. [name] in the next few weeks, here with severe R hip pain. Pt reports that last week, she tripped while walking and landed on her R hip, causing severe R hip pain. The pain is 10/10 with movement. She has been unable to walk due to this pain and has been essentially bed bound. She has subequently developed worsening weakness, fatigue, and poor appetite. She has been unable to really care for herself due to this. She's had some stbabing, burning pain down her R leg as well which is new, though denies any new back pain. No incontinence but has been urinating in a cup 2/2 pain with ambulation.        Past Medical History:  Diagnosis Date  . Allergy   . Anxiety   . Arthritis   . Depression   . Hypertension     Patient Active Problem List   Diagnosis Date Noted  . Intractable low back pain 12/15/2019  . Positive colorectal cancer screening using Cologuard test   . Polyp of transverse colon   . Back pain 09/17/2019  . Prediabetes 09/04/2019  . Fatigue 08/04/2019    Class: Chronic  . Snoring 08/04/2019  . Reactive depression 03/11/2019  . Essential hypertension 03/11/2019  . DDD (degenerative disc disease), lumbar 04/07/2014  . Lumbar radiculitis 04/07/2014  . Lumbar stenosis with neurogenic claudication 04/07/2014    Past Surgical History:  Procedure Laterality Date  . ABDOMINAL HYSTERECTOMY    . APPENDECTOMY    . COLONOSCOPY WITH PROPOFOL N/A 09/24/2019   Procedure: COLONOSCOPY WITH PROPOFOL;   Surgeon: Lucilla Lame, MD;  Location: [facility] ENDOSCOPY;  Service: Endoscopy;  Laterality: N/A;  Patient states given Versed for last procedure and woke up during. She is nervous about this happening again.  . TONSILLECTOMY      Prior to Admission medications   Medication Sig Start Date End Date Taking? Authorizing Provider  albuterol (VENTOLIN HFA) 108 (90 Base) MCG/ACT inhaler INHALE 2 PUFFS BY MOUTH EVERY 6 HOURS AS NEEDED FOR WHEEZING OR SHORTNESS OF BREATH 10/21/19  Yes Malfi, Lupita Raider, FNP  Cholecalciferol (D3 SUPER STRENGTH) 50 MCG (2000 UT) CAPS Take by mouth.   Yes [provider]  citalopram (CELEXA) 20 MG tablet Take 1 tablet by mouth once daily 12/07/19  Yes Malfi, Lupita Raider, FNP  gabapentin (NEURONTIN) 300 MG capsule Take 300 mg by mouth as directed. One capsule in the morning and two capsules every day at bedtime 02/12/19  Yes [provider]  lisinopril-hydrochlorothiazide (ZESTORETIC) 20-25 MG tablet Take 1 tablet by mouth once daily 12/07/19  Yes Malfi, Lupita Raider, FNP  meloxicam (MOBIC) 15 MG tablet Take 1 tablet by mouth once daily 07/20/19  Yes [provider]  traMADol (ULTRAM) 50 MG tablet Take 50 mg by mouth 3 (three) times daily as needed. 02/11/19  Yes [provider]  Magnesium 500 MG TABS Take 1 tablet by mouth daily. Patient not taking: Reported on 12/15/2019    [provider]  predniSONE (DELTASONE)  10 MG tablet  09/16/19   [provider]  Turmeric 1053 MG TABS Take by mouth. Patient not taking: Reported on 12/15/2019    [provider]    Allergies Penicillins  Family History  Problem Relation Age of Onset  . Prostate cancer Father   . Bone cancer Brother   . Heart disease Brother     Social History Social History   Tobacco Use  . Smoking status: Never Smoker  . Smokeless tobacco: Never Used  Vaping Use  . Vaping Use: Never used  Substance Use Topics  . Alcohol use: Yes    Alcohol/week: 3.0 standard  drinks    Types: 3 Glasses of wine per week    Comment: 3 wine coolers weekly  . Drug use: Never    Review of Systems  Review of Systems  Constitutional: Negative for fatigue and fever.  HENT: Negative for congestion and sore throat.   Eyes: Negative for visual disturbance.  Respiratory: Negative for cough and shortness of breath.   Cardiovascular: Negative for chest pain.  Gastrointestinal: Negative for abdominal pain, diarrhea, nausea and vomiting.  Genitourinary: Negative for flank pain.  Musculoskeletal: Positive for arthralgias, gait problem and myalgias. Negative for back pain and neck pain.  Skin: Negative for rash and wound.  Neurological: Positive for weakness.  All other systems reviewed and are negative.    ____________________________________________  PHYSICAL EXAM:      VITAL SIGNS: ED Triage Vitals  Enc Vitals Group     BP 12/14/19 1709 140/81     Pulse Rate 12/14/19 1709 98     Resp 12/14/19 1709 20     Temp 12/14/19 1709 99.5 F (37.5 C)     Temp Source 12/14/19 1709 Oral     SpO2 12/14/19 1709 99 %     Weight 12/14/19 1710 180 lb (81.6 kg)     Height 12/14/19 1710 5\' 5"  (1.651 m)     Head Circumference --      Peak Flow --      Pain Score 12/14/19 1709 6     Pain Loc --      Pain Edu? --      Excl. in [place]? --      Physical Exam Vitals and nursing note reviewed.  Constitutional:      General: She is not in acute distress.    Appearance: She is well-developed.  HENT:     Head: Normocephalic and atraumatic.  Eyes:     Conjunctiva/sclera: Conjunctivae normal.  Cardiovascular:     Rate and Rhythm: Normal rate and regular rhythm.     Heart sounds: Normal heart sounds.  Pulmonary:     Effort: Pulmonary effort is normal. No respiratory distress.     Breath sounds: No wheezing.  Abdominal:     General: There is no distension.  Musculoskeletal:     Cervical back: Neck supple.     Comments: Marked tenderness over R Hip with slight shortening. Pain  with log roll/pROM.   Skin:    General: Skin is warm.     Capillary Refill: Capillary refill takes less than 2 seconds.     Findings: No rash.  Neurological:     Mental Status: She is alert and oriented to person, place, and time.     Motor: No abnormal muscle tone.     Comments: Strength 5/5 bl LE. Normal sensation to light touch.       ____________________________________________   LABS (all labs ordered  are listed, but only abnormal results are displayed)  Labs Reviewed  URINALYSIS, COMPLETE (UACMP) WITH MICROSCOPIC - Abnormal; Notable for the following components:      Result Value   Color, Urine YELLOW (*)    APPearance HAZY (*)    Ketones, ur 20 (*)    Bacteria, UA RARE (*)    All other components within normal limits  COMPREHENSIVE METABOLIC PANEL - Abnormal; Notable for the following components:   BUN 29 (*)    Creatinine, Ser 1.08 (*)    GFR calc non Af Amer 52 (*)    All other components within normal limits  BASIC METABOLIC PANEL - Abnormal; Notable for the following components:   Glucose, Bld 116 (*)    BUN 28 (*)    All other components within normal limits  SARS CORONAVIRUS 2 BY RT PCR (HOSPITAL ORDER, [place] [facility])  CBC  HIV ANTIBODY (ROUTINE TESTING W REFLEX)  CBC  TROPONIN I (HIGH SENSITIVITY)  TROPONIN I (HIGH SENSITIVITY)    ____________________________________________  EKG: Normal sinus rhythm, VR 97. PR 120, QRS 122, QTc 459. RBBB, no acute ST elevation or depressions. No ischemia or infarct.  ________________________________________  RADIOLOGY All imaging, including plain films, CT scans, and ultrasounds, independently reviewed by me, and interpretations confirmed via formal radiology reads.  ED MD interpretation:   CXR: Clear, no acute abnormality [place] BL: No DVT XR Hip: Degen changes, no fx CT Hip: Degen changes, no osteonecrosis, no fx CT L Spine: Severe spinal stenosis  Official radiology report(s): DG Chest 2  View  Result Date: 12/14/2019 CLINICAL DATA:  Shortness of breath and chest pain EXAM: CHEST - 2 VIEW COMPARISON:  None. FINDINGS: Cardiac shadows within normal limits. The lungs are well aerated bilaterally. No focal infiltrate or effusion is seen. No acute bony abnormality is noted. IMPRESSION: No acute abnormality noted. Electronically Signed   By: Inez Catalina M.D.   On: 12/14/2019 19:43   CT Lumbar Spine Wo Contrast  Result Date: 12/14/2019 CLINICAL DATA:  Initial evaluation for low back pain, recent fall. EXAM: CT LUMBAR SPINE WITHOUT CONTRAST TECHNIQUE: Multidetector CT imaging of the lumbar spine was performed without intravenous contrast administration. Multiplanar CT image reconstructions were also generated. COMPARISON:  Prior study from 09/17/2011. FINDINGS: Segmentation: Standard. Lowest well-formed disc space labeled the L5-S1 level. Alignment: 5 mm facet mediated anterolisthesis of L4 on L5. Underlying moderate levoscoliosis with apex at L2-3. Vertebral bodies otherwise normally aligned with preservation of the normal lumbar lordosis. Vertebrae: Vertebral body height maintained without evidence for acute or chronic fracture. Visualized sacrum and pelvis intact. SI joints approximated. No discrete or worrisome osseous lesions. Paraspinal and other soft tissues: Paraspinous soft tissues demonstrate no acute finding. Mild aortic atherosclerosis. Visualized visceral structures within normal limits. Disc levels: L1-2: Minimal disc bulge with bilateral facet hypertrophy. No canal or foraminal stenosis. L2-3: Chronic intervertebral disc space narrowing with diffuse disc bulge and disc desiccation. Prominent right-sided reactive endplate changes with marginal endplate osteophytic spurring. Moderate bilateral facet hypertrophy. Resultant moderate right lateral recess stenosis, with moderate right L2 foraminal narrowing. Left neural foramen remains widely patent. L3-4: Diffuse disc bulge, eccentric to the  right. Moderate facet and ligament flavum hypertrophy. Resultant moderate canal with right greater than left lateral recess stenosis. Mild bilateral L3 foraminal narrowing, also greater on the right. L4-5: 5 mm anterolisthesis, chronic and facet mediated. Associated degenerative intervertebral disc space narrowing with disc occasion and broad posterior pseudo disc bulge/uncovering. Severe bilateral  facet arthrosis. Resultant severe spinal stenosis. Moderate left with mild right L4 foraminal narrowing. L5-S1: Diffuse disc bulge with disc desiccation. Disc bulging eccentric to the left. Moderate bilateral facet hypertrophy. No significant canal or lateral recess stenosis. Mild left L5 foraminal narrowing. IMPRESSION: 1. No acute traumatic injury within the lumbar spine. 2. 5 mm facet mediated anterolisthesis of L4 on L5 with resultant severe spinal stenosis. 3. Multifactorial degenerative changes at L2-3 and L3-4 with resultant moderate right lateral recess and foraminal stenosis at L2-3, with moderate spinal stenosis at L3-4. 4. Underlying moderate levoscoliosis. Electronically Signed   By: Jeannine Boga M.D.   On: 12/14/2019 23:47   MR LUMBAR SPINE WO CONTRAST  Result Date: 12/15/2019 CLINICAL DATA:  Initial evaluation for acute low back pain. EXAM: MRI LUMBAR SPINE WITHOUT CONTRAST TECHNIQUE: Multiplanar, multisequence MR imaging of the lumbar spine was performed. No intravenous contrast was administered. COMPARISON:  Prior CT from 12/14/2019. FINDINGS: Segmentation:  Standard. Alignment: 5 mm facet mediated anterolisthesis of L4 on L5. Trace 2 mm retrolisthesis of L2 on L3. Underlying moderate levoscoliosis with apex at L2-3. Alignment otherwise normal with preservation of the normal lumbar lordosis. Vertebrae: Vertebral body height maintained without evidence for acute or chronic fracture. Bone marrow signal intensity within normal limits. Few scattered benign hemangiomata noted. No worrisome osseous  lesions. Mild reactive endplate changes present about the L2-3, L4-5, and L5-S1 interspaces. No abnormal marrow edema. Conus medullaris and cauda equina: Conus extends to the L1 level. Conus and cauda equina appear normal. Paraspinal and other soft tissues: Paraspinous soft tissues within normal limits. Visualized visceral structures are normal. Disc levels: L1-2: Disc desiccation with mild annular disc bulge. Superimposed small left foraminal to extraforaminal disc protrusion contacts the exiting left L1 nerve root as it courses of the left neural foramen (series 13, image 4). No significant spinal stenosis. Mild left foraminal narrowing. L2-3: Trace retrolisthesis. Chronic intervertebral disc space narrowing with diffuse disc bulge and disc desiccation. Disc bulging eccentric to the right with associated prominent right-sided reactive endplate changes. Superimposed moderate right greater than left facet hypertrophy. Resultant mild canal with moderate right lateral recess stenosis, with moderate right L2 foraminal narrowing. L3-4: Diffuse disc bulge with disc desiccation. Superimposed right foraminal disc protrusion closely approximates the exiting right L3 nerve root as it courses of the right neural foramen (series 10, image 16). Moderate facet arthrosis. Resultant moderate canal with right worse than left lateral recess stenosis. Mild bilateral L3 foraminal stenosis. L4-5: 5 mm anterolisthesis. Chronic intervertebral disc space narrowing with disc desiccation. Broad-based posterior pseudo disc bulge/uncovering, eccentric to the left. Severe facet and ligament flavum hypertrophy. Associated trace bilateral joint effusions. Resultant severe spinal stenosis. Thecal sac measures 3 mm in AP diameter at its most narrow point. Moderate left with mild right L4 foraminal stenosis. L5-S1: Mild disc bulge with disc desiccation. Mild reactive endplate changes, greater on the left. Mild bilateral facet hypertrophy. No  significant canal or lateral recess stenosis. Mild left L5 foraminal narrowing. IMPRESSION: 1. No acute abnormality within the lumbar spine. 2. 5 mm facet mediated anterolisthesis of L4 on L5 with resultant severe spinal stenosis. 3. Right foraminal disc protrusion at L3-4, potentially affecting the exiting right L3 nerve root. 4. Right eccentric disc bulge with reactive endplate changes and facet hypertrophy at L2-3 with resultant moderate right foraminal and lateral recess stenosis. Either the right L2 or descending L3 nerve roots could be affected. 5. Small left foraminal to extraforaminal disc protrusion at L1-2, potentially affecting the  exiting left L1 nerve root. Electronically Signed   By: Jeannine Boga M.D.   On: 12/15/2019 03:28   CT Hip Right Wo Contrast  Result Date: 12/14/2019 CLINICAL DATA:  Right hip pain, known degenerative change and recent injury, initial encounter EXAM: CT OF THE RIGHT HIP WITHOUT CONTRAST TECHNIQUE: Multidetector CT imaging of the right hip was performed according to the standard protocol. Multiplanar CT image reconstructions were also generated. COMPARISON:  Plain film from earlier in the same day. FINDINGS: Bones/Joint/Cartilage Degenerative changes of the right hip joint are noted. Subchondral sclerosis and cyst formation is seen. Ligaments Suboptimally assessed by CT. Muscles and Tendons No muscular abnormality is noted. Soft tissues Surrounding soft tissue structures appear within normal limits with the exception of a small joint effusion in the right hip. Visualized pelvic structures are within normal limits. IMPRESSION: Degenerative changes the right hip joint without acute fracture. No findings to suggest avascular necrosis are noted as suggested on prior plain film. Small right hip joint effusion. Electronically Signed   By: Inez Catalina M.D.   On: 12/14/2019 23:25   US Venous Img Lower Bilateral  Result Date: 12/14/2019 CLINICAL DATA:  Swelling and pain for  2 weeks. EXAM: BILATERAL LOWER EXTREMITY VENOUS DOPPLER ULTRASOUND TECHNIQUE: Gray-scale sonography with compression, as well as color and duplex ultrasound, were performed to evaluate the deep venous system(s) from the level of the common femoral vein through the popliteal and proximal calf veins. COMPARISON:  None. FINDINGS: VENOUS Normal compressibility of the common femoral, superficial femoral, and popliteal veins, as well as the visualized calf veins. Visualized portions of profunda femoral vein and great saphenous vein unremarkable. No filling defects to suggest DVT on grayscale or color Doppler imaging. Doppler waveforms show normal direction of venous flow, normal respiratory plasticity and response to augmentation. OTHER None. Limitations: none IMPRESSION: Negative. Electronically Signed   By: Nolon Nations M.D.   On: 12/14/2019 18:35   DG Hip Unilat  With Pelvis 2-3 Views Right  Result Date: 12/14/2019 CLINICAL DATA:  Recent fall with right hip pain, initial encounter EXAM: DG HIP (WITH OR WITHOUT PELVIS) 3V RIGHT COMPARISON:  None. FINDINGS: Pelvic ring is intact. Degenerative changes of the right hip joint are noted. Some remodeling of the femoral head is seen. Curvilinear lucency is noted in the femoral head superiorly could avascular necrosis. No acute fracture is seen. 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 No acute abnormality noted. Electronically Signed   By: Inez Catalina M.D.   On: 12/14/2019 19:46    ____________________________________________  PROCEDURES   Procedure(s) performed (including Critical Care):  Procedures  ____________________________________________  INITIAL IMPRESSION / MDM / [place] / ED COURSE  As part of my medical decision making, I reviewed the following data within the [place] notes reviewed and incorporated, Old chart reviewed, Notes from prior ED visits, and  [place] Controlled Substance Mad[place] was evaluated in Emergency Department on 12/15/2019 for the symptoms described in the history of present illness. She was evaluated in the context of the global COVID-19 pandemic, which necessitated consideration that the patient might be at risk for infection with the SARS-CoV-2 virus that causes COVID-19. Institutional protocols and algorithms that pertain to the evaluation of patients at risk for COVID-19 are in a state of rapid change based on information released by regulatory bodies including the CDC and federal and state organizations. These policies  and algorithms were followed during the patient's care in the ED.  Some ED evaluations and interventions may be delayed as a result of limited staffing during the pandemic.*     Medical Decision Making:  69 yo F here with severe R hip pain now limiting ability to ambulate. Suspect this is actually coming from lumbar radicular pain. Will need MR, admit, pain control, PT. No signs of cauda equina clinically.  ____________________________________________  FINAL CLINICAL IMPRESSION(S) / ED DIAGNOSES  Final diagnoses:  Lumbar radiculopathy     MEDICATIONS GIVEN DURING THIS VISIT:  Medications  HYDROcodone-acetaminophen (NORCO) 7.5-325 MG per tablet 1 tablet (1 tablet Oral Given 12/15/19 0925)  meloxicam (MOBIC) tablet 15 mg (15 mg Oral Given 12/15/19 0912)  traMADol (ULTRAM) tablet 50 mg (50 mg Oral Given 12/15/19 0205)  citalopram (CELEXA) tablet 20 mg (20 mg Oral Given 12/15/19 0912)  gabapentin (NEURONTIN) capsule 300 mg (300 mg Oral Given 12/15/19 0912)  magnesium oxide (MAG-OX) tablet 400 mg (400 mg Oral Given 12/15/19 0912)  albuterol (PROVENTIL) (2.5 MG/3ML) 0.083% nebulizer solution 3 mL (has no administration in time range)  enoxaparin (LOVENOX) injection 40 mg (40 mg Subcutaneous Given 12/15/19 0913)  0.9 %  sodium chloride infusion ( Intravenous New Bag/Given 12/15/19 0249)  acetaminophen  (TYLENOL) tablet 650 mg (has no administration in time range)    Or  acetaminophen (TYLENOL) suppository 650 mg (has no administration in time range)  traZODone (DESYREL) tablet 25 mg (has no administration in time range)  magnesium hydroxide (MILK OF MAGNESIA) suspension 30 mL (has no administration in time range)  ondansetron (ZOFRAN) tablet 4 mg (has no administration in time range)    Or  ondansetron (ZOFRAN) injection 4 mg (has no administration in time range)  gabapentin (NEURONTIN) capsule 600 mg (has no administration in time range)  morphine 2 MG/ML injection 2 mg (has no administration in time range)  ketorolac (TORADOL) 15 MG/ML injection 15 mg (has no administration in time range)  lisinopril (ZESTRIL) tablet 20 mg (20 mg Oral Given 12/15/19 0912)    And  hydrochlorothiazide (HYDRODIURIL) tablet 25 mg (25 mg Oral Given 12/15/19 0912)  dexamethasone (DECADRON) injection 4 mg (4 mg Intravenous Given 12/15/19 0912)  oxyCODONE-acetaminophen (PERCOCET/ROXICET) 5-325 MG per tablet 1 tablet (1 tablet Oral Given 12/14/19 2345)  dexamethasone (DECADRON) injection 10 mg (10 mg Intravenous Given 12/15/19 0230)     ED Discharge Orders    None       Note:  This document was prepared using Dragon voice recognition software and may include unintentional dictation errors.   Duffy Bruce, MD 12/15/19 1515

## 2019-12-15 ENCOUNTER — Inpatient Hospital Stay: Payer: PPO

## 2019-12-15 DIAGNOSIS — R509 Fever, unspecified: Secondary | ICD-10-CM | POA: Diagnosis not present

## 2019-12-15 DIAGNOSIS — Z23 Encounter for immunization: Secondary | ICD-10-CM | POA: Diagnosis not present

## 2019-12-15 DIAGNOSIS — I340 Nonrheumatic mitral (valve) insufficiency: Secondary | ICD-10-CM | POA: Diagnosis not present

## 2019-12-15 DIAGNOSIS — M25551 Pain in right hip: Secondary | ICD-10-CM | POA: Diagnosis not present

## 2019-12-15 DIAGNOSIS — I82441 Acute embolism and thrombosis of right tibial vein: Secondary | ICD-10-CM | POA: Diagnosis present

## 2019-12-15 DIAGNOSIS — Q211 Atrial septal defect: Secondary | ICD-10-CM | POA: Diagnosis not present

## 2019-12-15 DIAGNOSIS — M5459 Other low back pain: Secondary | ICD-10-CM | POA: Diagnosis present

## 2019-12-15 DIAGNOSIS — M48061 Spinal stenosis, lumbar region without neurogenic claudication: Secondary | ICD-10-CM | POA: Diagnosis present

## 2019-12-15 DIAGNOSIS — S72051A Unspecified fracture of head of right femur, initial encounter for closed fracture: Secondary | ICD-10-CM | POA: Diagnosis present

## 2019-12-15 DIAGNOSIS — M5416 Radiculopathy, lumbar region: Secondary | ICD-10-CM

## 2019-12-15 DIAGNOSIS — Z79891 Long term (current) use of opiate analgesic: Secondary | ICD-10-CM | POA: Diagnosis not present

## 2019-12-15 DIAGNOSIS — Z791 Long term (current) use of non-steroidal anti-inflammatories (NSAID): Secondary | ICD-10-CM | POA: Diagnosis not present

## 2019-12-15 DIAGNOSIS — G629 Polyneuropathy, unspecified: Secondary | ICD-10-CM | POA: Diagnosis present

## 2019-12-15 DIAGNOSIS — Z79899 Other long term (current) drug therapy: Secondary | ICD-10-CM | POA: Diagnosis not present

## 2019-12-15 DIAGNOSIS — I10 Essential (primary) hypertension: Secondary | ICD-10-CM

## 2019-12-15 DIAGNOSIS — M1611 Unilateral primary osteoarthritis, right hip: Secondary | ICD-10-CM | POA: Diagnosis present

## 2019-12-15 DIAGNOSIS — R000 Tachycardia, unspecified: Secondary | ICD-10-CM | POA: Diagnosis not present

## 2019-12-15 DIAGNOSIS — I361 Nonrheumatic tricuspid (valve) insufficiency: Secondary | ICD-10-CM | POA: Diagnosis not present

## 2019-12-15 DIAGNOSIS — Z7952 Long term (current) use of systemic steroids: Secondary | ICD-10-CM | POA: Diagnosis not present

## 2019-12-15 DIAGNOSIS — M4316 Spondylolisthesis, lumbar region: Secondary | ICD-10-CM | POA: Diagnosis present

## 2019-12-15 DIAGNOSIS — Z20822 Contact with and (suspected) exposure to covid-19: Secondary | ICD-10-CM | POA: Diagnosis present

## 2019-12-15 DIAGNOSIS — Z8042 Family history of malignant neoplasm of prostate: Secondary | ICD-10-CM | POA: Diagnosis not present

## 2019-12-15 DIAGNOSIS — Z8249 Family history of ischemic heart disease and other diseases of the circulatory system: Secondary | ICD-10-CM | POA: Diagnosis not present

## 2019-12-15 DIAGNOSIS — I639 Cerebral infarction, unspecified: Secondary | ICD-10-CM | POA: Diagnosis not present

## 2019-12-15 DIAGNOSIS — M545 Low back pain: Secondary | ICD-10-CM | POA: Diagnosis not present

## 2019-12-15 DIAGNOSIS — F329 Major depressive disorder, single episode, unspecified: Secondary | ICD-10-CM | POA: Diagnosis present

## 2019-12-15 DIAGNOSIS — R0602 Shortness of breath: Secondary | ICD-10-CM | POA: Diagnosis present

## 2019-12-15 DIAGNOSIS — G8929 Other chronic pain: Secondary | ICD-10-CM | POA: Diagnosis present

## 2019-12-15 DIAGNOSIS — M5116 Intervertebral disc disorders with radiculopathy, lumbar region: Secondary | ICD-10-CM | POA: Diagnosis present

## 2019-12-15 DIAGNOSIS — Z880 Allergy status to penicillin: Secondary | ICD-10-CM | POA: Diagnosis not present

## 2019-12-15 DIAGNOSIS — D62 Acute posthemorrhagic anemia: Secondary | ICD-10-CM | POA: Diagnosis not present

## 2019-12-15 DIAGNOSIS — I4510 Unspecified right bundle-branch block: Secondary | ICD-10-CM | POA: Diagnosis present

## 2019-12-15 DIAGNOSIS — I959 Hypotension, unspecified: Secondary | ICD-10-CM | POA: Diagnosis not present

## 2019-12-15 DIAGNOSIS — Z90710 Acquired absence of both cervix and uterus: Secondary | ICD-10-CM | POA: Diagnosis not present

## 2019-12-15 DIAGNOSIS — D649 Anemia, unspecified: Secondary | ICD-10-CM | POA: Diagnosis not present

## 2019-12-15 LAB — BASIC METABOLIC PANEL
Anion gap: 10 (ref 5–15)
BUN: 28 mg/dL — ABNORMAL HIGH (ref 8–23)
CO2: 28 mmol/L (ref 22–32)
Calcium: 9 mg/dL (ref 8.9–10.3)
Chloride: 103 mmol/L (ref 98–111)
Creatinine, Ser: 0.93 mg/dL (ref 0.44–1.00)
GFR calc Af Amer: >60 mL/min (ref >60)
GFR calc non Af Amer: >60 mL/min (ref >60)
Glucose, Bld: 116 mg/dL — ABNORMAL HIGH (ref 70–99)
Potassium: 4.3 mmol/L (ref 3.5–5.1)
Sodium: 141 mmol/L (ref 135–145)

## 2019-12-15 LAB — CBC
HCT: 37.5 % (ref 36.0–46.0)
Hemoglobin: 12.5 g/dL (ref 12.0–15.0)
MCH: 30.3 pg (ref 26.0–34.0)
MCHC: 33.3 g/dL (ref 30.0–36.0)
MCV: 91 fL (ref 80.0–100.0)
Platelets: 218 10*3/uL (ref 150–400)
RBC: 4.12 MIL/uL (ref 3.87–5.11)
RDW: 14.2 % (ref 11.5–15.5)
WBC: 9.2 10*3/uL (ref 4.0–10.5)
nRBC: 0 % (ref 0.0–0.2)

## 2019-12-15 LAB — SARS CORONAVIRUS 2 BY RT PCR (HOSPITAL ORDER, PERFORMED IN ~~LOC~~ HOSPITAL LAB): SARS Coronavirus 2: NEGATIVE

## 2019-12-15 LAB — HIV ANTIBODY (ROUTINE TESTING W REFLEX): HIV Screen 4th Generation wRfx: NONREACTIVE

## 2019-12-15 MED ORDER — ONDANSETRON HCL 4 MG/2ML IJ SOLN
4.0000 mg | Freq: Four times a day (QID) | INTRAMUSCULAR | Status: DC | PRN
  Administered 2019-12-18 – 2019-12-19 (×3): 4 mg via INTRAVENOUS
  Filled 2019-12-15: qty 2

## 2019-12-15 MED ORDER — TRAZODONE HCL 50 MG PO TABS
25.0000 mg | ORAL_TABLET | Freq: Every evening | ORAL | Status: DC | PRN
  Administered 2019-12-16 – 2019-12-22 (×2): 25 mg via ORAL
  Filled 2019-12-15 (×2): qty 1

## 2019-12-15 MED ORDER — TRAMADOL HCL 50 MG PO TABS
50.0000 mg | ORAL_TABLET | Freq: Three times a day (TID) | ORAL | Status: DC | PRN
  Administered 2019-12-15 – 2019-12-23 (×4): 50 mg via ORAL
  Filled 2019-12-15 (×5): qty 1

## 2019-12-15 MED ORDER — MELOXICAM 7.5 MG PO TABS
15.0000 mg | ORAL_TABLET | Freq: Every day | ORAL | Status: DC
  Administered 2019-12-15 – 2019-12-16 (×2): 15 mg via ORAL
  Filled 2019-12-15 (×3): qty 2

## 2019-12-15 MED ORDER — ACETAMINOPHEN 325 MG PO TABS
650.0000 mg | ORAL_TABLET | Freq: Four times a day (QID) | ORAL | Status: DC | PRN
  Administered 2019-12-19 – 2019-12-23 (×4): 650 mg via ORAL
  Filled 2019-12-15 (×5): qty 2

## 2019-12-15 MED ORDER — HYDROCHLOROTHIAZIDE 25 MG PO TABS
25.0000 mg | ORAL_TABLET | Freq: Every day | ORAL | Status: DC
  Administered 2019-12-15 – 2019-12-16 (×2): 25 mg via ORAL
  Filled 2019-12-15 (×2): qty 1

## 2019-12-15 MED ORDER — DEXAMETHASONE SODIUM PHOSPHATE 4 MG/ML IJ SOLN
4.0000 mg | Freq: Two times a day (BID) | INTRAMUSCULAR | Status: DC
  Administered 2019-12-15 – 2019-12-16 (×4): 4 mg via INTRAVENOUS
  Filled 2019-12-15 (×6): qty 1

## 2019-12-15 MED ORDER — HYDROCODONE-ACETAMINOPHEN 7.5-325 MG PO TABS
1.0000 | ORAL_TABLET | ORAL | Status: DC | PRN
  Administered 2019-12-15 – 2019-12-23 (×27): 1 via ORAL
  Filled 2019-12-15 (×29): qty 1

## 2019-12-15 MED ORDER — CITALOPRAM HYDROBROMIDE 20 MG PO TABS
20.0000 mg | ORAL_TABLET | Freq: Every day | ORAL | Status: DC
  Administered 2019-12-15 – 2019-12-23 (×8): 20 mg via ORAL
  Filled 2019-12-15 (×10): qty 1

## 2019-12-15 MED ORDER — GABAPENTIN 300 MG PO CAPS
600.0000 mg | ORAL_CAPSULE | Freq: Every day | ORAL | Status: DC
  Administered 2019-12-15 – 2019-12-22 (×8): 600 mg via ORAL
  Filled 2019-12-15 (×3): qty 2
  Filled 2019-12-15: qty 6
  Filled 2019-12-15 (×4): qty 2

## 2019-12-15 MED ORDER — ACETAMINOPHEN 650 MG RE SUPP: 650.0000 mg | Freq: Four times a day (QID) | RECTAL | Status: DC | PRN

## 2019-12-15 MED ORDER — MAGNESIUM HYDROXIDE 400 MG/5ML PO SUSP
30.0000 mL | Freq: Every day | ORAL | Status: DC | PRN
  Administered 2019-12-16: 30 mL via ORAL
  Filled 2019-12-15: qty 30

## 2019-12-15 MED ORDER — ALBUTEROL SULFATE (2.5 MG/3ML) 0.083% IN NEBU: 3.0000 mL | INHALATION_SOLUTION | RESPIRATORY_TRACT | Status: DC | PRN

## 2019-12-15 MED ORDER — KETOROLAC TROMETHAMINE 15 MG/ML IJ SOLN
15.0000 mg | Freq: Four times a day (QID) | INTRAMUSCULAR | Status: AC | PRN
  Administered 2019-12-17 – 2019-12-19 (×5): 15 mg via INTRAVENOUS
  Filled 2019-12-15 (×5): qty 1

## 2019-12-15 MED ORDER — LISINOPRIL 20 MG PO TABS
20.0000 mg | ORAL_TABLET | Freq: Every day | ORAL | Status: DC
  Administered 2019-12-15 – 2019-12-22 (×7): 20 mg via ORAL
  Filled 2019-12-15 (×7): qty 1

## 2019-12-15 MED ORDER — MORPHINE SULFATE (PF) 2 MG/ML IV SOLN: 2.0000 mg | INTRAVENOUS | Status: DC | PRN

## 2019-12-15 MED ORDER — SODIUM CHLORIDE 0.9 % IV SOLN: INTRAVENOUS | Status: DC

## 2019-12-15 MED ORDER — GABAPENTIN 300 MG PO CAPS
300.0000 mg | ORAL_CAPSULE | Freq: Every day | ORAL | Status: DC
  Administered 2019-12-15 – 2019-12-23 (×8): 300 mg via ORAL
  Filled 2019-12-15 (×8): qty 1

## 2019-12-15 MED ORDER — MAGNESIUM OXIDE 400 (241.3 MG) MG PO TABS
400.0000 mg | ORAL_TABLET | Freq: Every day | ORAL | Status: DC
  Administered 2019-12-15 – 2019-12-23 (×8): 400 mg via ORAL
  Filled 2019-12-15 (×8): qty 1

## 2019-12-15 MED ORDER — ENOXAPARIN SODIUM 40 MG/0.4ML ~~LOC~~ SOLN
40.0000 mg | SUBCUTANEOUS | Status: DC
  Administered 2019-12-15 – 2019-12-16 (×2): 40 mg via SUBCUTANEOUS
  Filled 2019-12-15 (×2): qty 0.4

## 2019-12-15 MED ORDER — ONDANSETRON HCL 4 MG PO TABS: 4.0000 mg | ORAL_TABLET | Freq: Four times a day (QID) | ORAL | Status: DC | PRN

## 2019-12-15 MED ORDER — LISINOPRIL-HYDROCHLOROTHIAZIDE 20-25 MG PO TABS: 1.0000 | ORAL_TABLET | Freq: Every day | ORAL | Status: DC

## 2019-12-15 NOTE — Progress Notes (Signed)
[facility] PROGRESS NOTE   Linda Farley SWN:[id number] DOB: Feb 22, 1951 DOA: 12/14/2019  PCP: [name], FNP  Brief History/Interval Summary: 69 y.o. Caucasian female with a known history of hypertension, depression, spinal stenosis with chronic low back pain, who presented to the emergency room with acute onset of worsening low back pain which has been intractable as well as right hip pain with radiation to her right leg with associated right leg weakness and burning without tingling or numbness.  She has an appointment for discussion about right hip arthroplasty with Dr. [name] in [place] [facility] coming soon.  Patient was unable to weight-bear and ambulate.  She was hospitalized.  Neurosurgery as well as orthopedic surgery was consulted.  Reason for Visit: Significant pain in the right hip with inability to bear weight.  Consultants: Neurosurgery.  Orthopedics.  Procedures: None yet  Antibiotics: Anti-infectives (From admission, onward)   None      Subjective/Interval History: Patient states that her pain is mainly located in the right groin area.  7 out of 10 in intensity.  Her back pain however has been stable.  She denies any nausea vomiting.  Denies any urinary incontinence.  ROS: No shortness of breath or cough    Assessment/Plan:  Intractable right hip pain with inability to bear weight and ambulate Patient known to have arthritis.  She was supposed to see orthopedics to discuss hip joint replacement.  She is unable to bear weight on the right side.  Examination does not suggest any fractures.  She did have a CT scan done yesterday which does not show any fractures in that area.  Orthopedics has been consulted.  She may need intra-articular injection.  Chronic low back pain with radiculopathy Seen by neurosurgery.  MRI of the lumbar spine was done which does not show any acute findings.  She was given intravenous Decadron.  Her back issues appear to be stable.   No plans for any surgical intervention currently.  Follow-up with her physiatrist.  Essential hypertension She is noted to be on lisinopril and hydrochlorothiazide which is being continued.  Monitor blood pressures closely.  Reasonably well controlled.  No DVT noted on Doppler studies.  Peripheral neuropathy Continue with gabapentin.  History of depression Continue with citalopram   DVT Prophylaxis: Lovenox Code Status: Full code Family Communication: Discussed with the patient Disposition Plan:  Status is: Inpatient  Remains inpatient appropriate because:Ongoing active pain requiring inpatient pain management   Dispo: The patient is from: Home              Anticipated d/c is to: Home              Anticipated d/c date is: 2 days              Patient currently is not medically stable to d/c.    Medications:  Scheduled: . citalopram  20 mg Oral Daily  . dexamethasone (DECADRON) injection  4 mg Intravenous Q12H  . enoxaparin (LOVENOX) injection  40 mg Subcutaneous Q24H  . gabapentin  300 mg Oral Daily  . gabapentin  600 mg Oral QHS  . lisinopril  20 mg Oral Daily   And  . hydrochlorothiazide  25 mg Oral Daily  . magnesium oxide  400 mg Oral Daily  . meloxicam  15 mg Oral Daily   Continuous: . sodium chloride 100 mL/hr at 12/15/19 0249   XFG:[id number] **OR** acetaminophen, albuterol, HYDROcodone-acetaminophen, ketorolac, magnesium hydroxide, morphine injection, ondansetron **OR** ondansetron (ZOFRAN) IV,  traMADol, traZODone   Objective:  Vital Signs  Vitals:   12/14/19 2333 12/15/19 0115 12/15/19 0516 12/15/19 0808  BP: (!) 142/77 134/70 132/69 127/70  Pulse: 80 79 78 83  Resp: 12 20  18   Temp: 98.6 F (37 C) 98.9 F (37.2 C) 98.8 F (37.1 C) 98 F (36.7 C)  TempSrc:  Oral Oral Oral  SpO2: 100% 98% 100% 96%  Weight:      Height:        Intake/Output Summary (Last 24 hours) at 12/15/2019 1133 Last data filed at 12/15/2019 0305 Gross per 24 hour  Intake  0 ml  Output --  Net 0 ml   Filed Weights   12/14/19 1710  Weight: 81.6 kg    General appearance: Awake alert.  In no distress Resp: Clear to auscultation bilaterally.  Normal effort Cardio: S1-S2 is normal regular.  No S3-S4.  No rubs murmurs or bruit GI: Abdomen is soft.  Nontender nondistended.  Bowel sounds are present normal.  No masses organomegaly Extremities: unable to lift the right leg off the bed.  She does allow passive movement. Neurologic: Alert and oriented x3.  No focal neurological deficits.    Lab Results:  Data Reviewed: I have personally reviewed following labs and imaging studies  CBC: Recent Labs  Lab 12/14/19 1711 12/15/19 0507  WBC 10.0 9.2  HGB 13.7 12.5  HCT 41.7 37.5  MCV 92.1 91.0  PLT 224 841    Basic Metabolic Panel: Recent Labs  Lab 12/14/19 1711 12/15/19 0507  NA 140 141  K 3.9 4.3  CL 102 103  CO2 27 28  GLUCOSE 95 116*  BUN 29* 28*  CREATININE 1.08* 0.93  CALCIUM 9.2 9.0    GFR: Estimated Creatinine Clearance: 60.2 mL/min (by C-G formula based on SCr of 0.93 mg/dL).  Liver Function Tests: Recent Labs  Lab 12/14/19 1711  AST 17  ALT 12  ALKPHOS 87  BILITOT 0.9  PROT 6.8  ALBUMIN 3.9     Recent Results (from the past 240 hour(s))  SARS Coronavirus 2 by RT PCR (hospital order, performed in [facility] hospital lab) Nasopharyngeal Nasopharyngeal Swab     Status: None   Collection Time: 12/15/19 12:35 AM   Specimen: Nasopharyngeal Swab  Result Value Ref Range Status   SARS Coronavirus 2 NEGATIVE NEGATIVE Final    Comment: (NOTE) SARS-CoV-2 target nucleic acids are NOT DETECTED.  The SARS-CoV-2 RNA is generally detectable in upper and lower respiratory specimens during the acute phase of infection. The lowest concentration of SARS-CoV-2 viral copies this assay can detect is 250 copies / mL. A negative result does not preclude SARS-CoV-2 infection and should not be used as the sole basis for treatment or  other patient management decisions.  A negative result may occur with improper specimen collection / handling, submission of specimen other than nasopharyngeal swab, presence of viral mutation(s) within the areas targeted by this assay, and inadequate number of viral copies (<250 copies / mL). A negative result must be combined with clinical observations, patient history, and epidemiological information.  Fact Sheet for Patients:   StrictlyIdeas.no  Fact Sheet for Healthcare Providers: BankingDealers.co.za  This test is not yet approved or  cleared by the [place] FDA and has been authorized for detection and/or diagnosis of SARS-CoV-2 by FDA under an Emergency Use Authorization (EUA).  This EUA will remain in effect (meaning this test can be used) for the duration of the COVID-19 declaration under Section 564(b)(1) of  the Act, 21 U.S.C. section 360bbb-3(b)(1), unless the authorization is terminated or revoked sooner.  Performed at [facility], [address] [place], [place] [address]       Radiology Studies: DG Chest 2 View  Result Date: 12/14/2019 CLINICAL DATA:  Shortness of breath and chest pain EXAM: CHEST - 2 VIEW COMPARISON:  None. FINDINGS: Cardiac shadows within normal limits. The lungs are well aerated bilaterally. No focal infiltrate or effusion is seen. No acute bony abnormality is noted. IMPRESSION: No acute abnormality noted. Electronically Signed   By: Inez Catalina M.D.   On: 12/14/2019 19:43   CT Lumbar Spine Wo Contrast  Result Date: 12/14/2019 CLINICAL DATA:  Initial evaluation for low back pain, recent fall. EXAM: CT LUMBAR SPINE WITHOUT CONTRAST TECHNIQUE: Multidetector CT imaging of the lumbar spine was performed without intravenous contrast administration. Multiplanar CT image reconstructions were also generated. COMPARISON:  Prior study from 09/17/2011. FINDINGS: Segmentation: Standard. Lowest  well-formed disc space labeled the L5-S1 level. Alignment: 5 mm facet mediated anterolisthesis of L4 on L5. Underlying moderate levoscoliosis with apex at L2-3. Vertebral bodies otherwise normally aligned with preservation of the normal lumbar lordosis. Vertebrae: Vertebral body height maintained without evidence for acute or chronic fracture. Visualized sacrum and pelvis intact. SI joints approximated. No discrete or worrisome osseous lesions. Paraspinal and other soft tissues: Paraspinous soft tissues demonstrate no acute finding. Mild aortic atherosclerosis. Visualized visceral structures within normal limits. Disc levels: L1-2: Minimal disc bulge with bilateral facet hypertrophy. No canal or foraminal stenosis. L2-3: Chronic intervertebral disc space narrowing with diffuse disc bulge and disc desiccation. Prominent right-sided reactive endplate changes with marginal endplate osteophytic spurring. Moderate bilateral facet hypertrophy. Resultant moderate right lateral recess stenosis, with moderate right L2 foraminal narrowing. Left neural foramen remains widely patent. L3-4: Diffuse disc bulge, eccentric to the right. Moderate facet and ligament flavum hypertrophy. Resultant moderate canal with right greater than left lateral recess stenosis. Mild bilateral L3 foraminal narrowing, also greater on the right. L4-5: 5 mm anterolisthesis, chronic and facet mediated. Associated degenerative intervertebral disc space narrowing with disc occasion and broad posterior pseudo disc bulge/uncovering. Severe bilateral facet arthrosis. Resultant severe spinal stenosis. Moderate left with mild right L4 foraminal narrowing. L5-S1: Diffuse disc bulge with disc desiccation. Disc bulging eccentric to the left. Moderate bilateral facet hypertrophy. No significant canal or lateral recess stenosis. Mild left L5 foraminal narrowing. IMPRESSION: 1. No acute traumatic injury within the lumbar spine. 2. 5 mm facet mediated anterolisthesis  of L4 on L5 with resultant severe spinal stenosis. 3. Multifactorial degenerative changes at L2-3 and L3-4 with resultant moderate right lateral recess and foraminal stenosis at L2-3, with moderate spinal stenosis at L3-4. 4. Underlying moderate levoscoliosis. Electronically Signed   By: Jeannine Boga M.D.   On: 12/14/2019 23:47   MR LUMBAR SPINE WO CONTRAST  Result Date: 12/15/2019 CLINICAL DATA:  Initial evaluation for acute low back pain. EXAM: MRI LUMBAR SPINE WITHOUT CONTRAST TECHNIQUE: Multiplanar, multisequence MR imaging of the lumbar spine was performed. No intravenous contrast was administered. COMPARISON:  Prior CT from 12/14/2019. FINDINGS: Segmentation:  Standard. Alignment: 5 mm facet mediated anterolisthesis of L4 on L5. Trace 2 mm retrolisthesis of L2 on L3. Underlying moderate levoscoliosis with apex at L2-3. Alignment otherwise normal with preservation of the normal lumbar lordosis. Vertebrae: Vertebral body height maintained without evidence for acute or chronic fracture. Bone marrow signal intensity within normal limits. Few scattered benign hemangiomata noted. No worrisome osseous lesions. Mild reactive endplate changes present  about the L2-3, L4-5, and L5-S1 interspaces. No abnormal marrow edema. Conus medullaris and cauda equina: Conus extends to the L1 level. Conus and cauda equina appear normal. Paraspinal and other soft tissues: Paraspinous soft tissues within normal limits. Visualized visceral structures are normal. Disc levels: L1-2: Disc desiccation with mild annular disc bulge. Superimposed small left foraminal to extraforaminal disc protrusion contacts the exiting left L1 nerve root as it courses of the left neural foramen (series 13, image 4). No significant spinal stenosis. Mild left foraminal narrowing. L2-3: Trace retrolisthesis. Chronic intervertebral disc space narrowing with diffuse disc bulge and disc desiccation. Disc bulging eccentric to the right with associated  prominent right-sided reactive endplate changes. Superimposed moderate right greater than left facet hypertrophy. Resultant mild canal with moderate right lateral recess stenosis, with moderate right L2 foraminal narrowing. L3-4: Diffuse disc bulge with disc desiccation. Superimposed right foraminal disc protrusion closely approximates the exiting right L3 nerve root as it courses of the right neural foramen (series 10, image 16). Moderate facet arthrosis. Resultant moderate canal with right worse than left lateral recess stenosis. Mild bilateral L3 foraminal stenosis. L4-5: 5 mm anterolisthesis. Chronic intervertebral disc space narrowing with disc desiccation. Broad-based posterior pseudo disc bulge/uncovering, eccentric to the left. Severe facet and ligament flavum hypertrophy. Associated trace bilateral joint effusions. Resultant severe spinal stenosis. Thecal sac measures 3 mm in AP diameter at its most narrow point. Moderate left with mild right L4 foraminal stenosis. L5-S1: Mild disc bulge with disc desiccation. Mild reactive endplate changes, greater on the left. Mild bilateral facet hypertrophy. No significant canal or lateral recess stenosis. Mild left L5 foraminal narrowing. IMPRESSION: 1. No acute abnormality within the lumbar spine. 2. 5 mm facet mediated anterolisthesis of L4 on L5 with resultant severe spinal stenosis. 3. Right foraminal disc protrusion at L3-4, potentially affecting the exiting right L3 nerve root. 4. Right eccentric disc bulge with reactive endplate changes and facet hypertrophy at L2-3 with resultant moderate right foraminal and lateral recess stenosis. Either the right L2 or descending L3 nerve roots could be affected. 5. Small left foraminal to extraforaminal disc protrusion at L1-2, potentially affecting the exiting left L1 nerve root. Electronically Signed   By: Jeannine Boga M.D.   On: 12/15/2019 03:28   CT Hip Right Wo Contrast  Result Date: 12/14/2019 CLINICAL  DATA:  Right hip pain, known degenerative change and recent injury, initial encounter EXAM: CT OF THE RIGHT HIP WITHOUT CONTRAST TECHNIQUE: Multidetector CT imaging of the right hip was performed according to the standard protocol. Multiplanar CT image reconstructions were also generated. COMPARISON:  Plain film from earlier in the same day. FINDINGS: Bones/Joint/Cartilage Degenerative changes of the right hip joint are noted. Subchondral sclerosis and cyst formation is seen. Ligaments Suboptimally assessed by CT. Muscles and Tendons No muscular abnormality is noted. Soft tissues Surrounding soft tissue structures appear within normal limits with the exception of a small joint effusion in the right hip. Visualized pelvic structures are within normal limits. IMPRESSION: Degenerative changes the right hip joint without acute fracture. No findings to suggest avascular necrosis are noted as suggested on prior plain film. Small right hip joint effusion. Electronically Signed   By: Inez Catalina M.D.   On: 12/14/2019 23:25   US Venous Img Lower Bilateral  Result Date: 12/14/2019 CLINICAL DATA:  Swelling and pain for 2 weeks. EXAM: BILATERAL LOWER EXTREMITY VENOUS DOPPLER ULTRASOUND TECHNIQUE: Gray-scale sonography with compression, as well as color and duplex ultrasound, were performed to evaluate the deep  venous system(s) from the level of the common femoral vein through the popliteal and proximal calf veins. COMPARISON:  None. FINDINGS: VENOUS Normal compressibility of the common femoral, superficial femoral, and popliteal veins, as well as the visualized calf veins. Visualized portions of profunda femoral vein and great saphenous vein unremarkable. No filling defects to suggest DVT on grayscale or color Doppler imaging. Doppler waveforms show normal direction of venous flow, normal respiratory plasticity and response to augmentation. OTHER None. Limitations: none IMPRESSION: Negative. Electronically Signed   By:  Nolon Nations M.D.   On: 12/14/2019 18:35   DG Hip Unilat  With Pelvis 2-3 Views Right  Result Date: 12/14/2019 CLINICAL DATA:  Recent fall with right hip pain, initial encounter EXAM: DG HIP (WITH OR WITHOUT PELVIS) 3V RIGHT COMPARISON:  None. FINDINGS: Pelvic ring is intact. Degenerative changes of the right hip joint are noted. Some remodeling of the femoral head is seen. Curvilinear lucency is noted in the femoral head superiorly could avascular necrosis. No acute fracture is seen. 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 No acute abnormality noted. Electronically Signed   By: Inez Catalina M.D.   On: 12/14/2019 19:46       LOS: 0 days   [facility] Pager on [email or web address]  12/15/2019, 11:33 AM

## 2019-12-15 NOTE — TOC Initial Note (Signed)
Transition of Care [facility]) - Initial/Assessment Note    Patient Details  Name: Linda Farley MRN: [id number] Date of Birth: Sep 25, 1950  Transition of [facility]) CM/SW Contact:    Elease Hashimoto, LCSW Phone Number: 12/15/2019, 3:29 PM  Clinical Narrative: Met with pt to discuss discharge plans. She is hoping when the ortho comes to see her he will get on the surgery schedule. She is in such pain she doesn't move much at home and is in bed mostly which she is getting weaker and her endurance is worse now. Her husband helps some but he is not a caregiver. He takes care of the horses on the farm and she is responsible for the dogs. Will work with on discharge needs, await ortho consult               Expected Discharge Plan: Home/Self Care Barriers to Discharge: Continued Medical Work up   Patient Goals and CMS Choice Patient states their goals for this hospitalization and ongoing recovery are:: I need hip replacement surgery I hope when orthro comes in to see me he can get me in for surgery CMS Medicare.gov Compare Post Acute Care list provided to:: Patient Choice offered to / list presented to : Patient  Expected Discharge Plan and Services Expected Discharge Plan: Home/Self Care In-house Referral: Clinical Social Work   Post Acute Care Choice: Durable Medical Equipment Living arrangements for the past 2 months: Single Family Home                 DME Arranged: Walker rolling DME Agency: [employer] Date DME Agency Contacted: 12/15/19 Time DME Agency Contacted: [phone] Representative spoke with at DME Agency: Zack            Prior Living Arrangements/Services Living arrangements for the past 2 months: [place] with:: Spouse Patient language and need for interpreter reviewed:: No Do you feel safe going back to the place where you live?: Yes      Need for Family Participation in Patient Care: Yes (Comment) Care giver support system in place?: Yes  (comment) Current home services: DME (standard walker, canes, rollator, bsc) Criminal Activity/Legal Involvement Pertinent to Current Situation/Hospitalization: No - Comment as needed  Activities of Daily Living Home Assistive Devices/Equipment: Eyeglasses ADL Screening (condition at time of admission) Patient's cognitive ability adequate to safely complete daily activities?: Yes Is the patient deaf or have difficulty hearing?: No Does the patient have difficulty seeing, even when wearing glasses/contacts?: No Does the patient have difficulty concentrating, remembering, or making decisions?: No Patient able to express need for assistance with ADLs?: No Does the patient have difficulty dressing or bathing?: No Independently performs ADLs?: Yes (appropriate for developmental age) (Husband assist) Does the patient have difficulty walking or climbing stairs?: Yes Weakness of Legs: Both Weakness of Arms/Hands: Both  Permission Sought/Granted Permission sought to share information with : Family Supports Permission granted to share information with : Yes, Verbal Permission Granted  Share Information with NAME: Jenny Reichmann     Permission granted to share info w Relationship: Husband     Emotional Assessment Appearance:: Appears stated age Attitude/Demeanor/Rapport: Engaged Affect (typically observed): Adaptable, Accepting Orientation: : Oriented to Self, Oriented to Place, Oriented to  Time, Oriented to Situation   Psych Involvement: No (comment)  Admission diagnosis:  Intractable low back pain [M54.5] Patient Active Problem List   Diagnosis Date Noted  . Intractable low back pain 12/15/2019  . Positive colorectal cancer screening using Cologuard test   .  Polyp of transverse colon   . Back pain 09/17/2019  . Prediabetes 09/04/2019  . Fatigue 08/04/2019    Class: Chronic  . Snoring 08/04/2019  . Reactive depression 03/11/2019  . Essential hypertension 03/11/2019  . DDD (degenerative  disc disease), lumbar 04/07/2014  . Lumbar radiculitis 04/07/2014  . Lumbar stenosis with neurogenic claudication 04/07/2014   PCP:  [name], FNP Pharmacy:   [facility] [address]), [place] [address] [place]) [place] [address] Phone: [phone] Fax: [phone]     Social Determinants of Health (SDOH) Interventions    Readmission Risk Interventions No flowsheet data found.

## 2019-12-15 NOTE — Evaluation (Addendum)
Physical Therapy Evaluation Patient Details Name: Linda Farley MRN: [id number] DOB: 1950/08/15 Today's Date: 12/15/2019   History of Present Illness  Patient is a 69 year old female presenting to the ED with acute onset worsening low back pain as well as right hip pain with radiation to right leg with associated right leg weakness/burning without tingling or numbness. CT of right hip without acute fracture and no findings to suggest avascular necrosis per report. Ortho consult pending. Neurosurgery consulted and recommending conservative management for lumbar degenerative disease, spinal stenosis. History of peripheral neuropathy, depression, HTN,  Clinical Impression  PT evaluation complete. Patient reports 4/10 pain in right hip that does not increase with bed mobility, sitting, or standing. Patient has weakness in RLE with MMT and reports one fall at home recently. Patient with minimal weight acceptance on RLE in standing position and relying heavily on rolling walker for UE support in standing. Recommend to use rolling walker instead of using 2 canes (what patient uses at baseline) for safety and fall prevention. Patient able to take several side steps along edge of bed but declined ambulation further due to fatigue. She reports she has been mostly in the bed at home recently after fall. Patient fatigued quickly with activity. Recommend PT to maximize function and safety to facilitate independence.     Follow Up Recommendations Home health PT    Equipment Recommendations  Rolling walker with 5" wheels    Recommendations for Other Services       Precautions / Restrictions Precautions Precautions: Fall Restrictions Weight Bearing Restrictions: No      Mobility  Bed Mobility Overal bed mobility: Needs Assistance Bed Mobility: Supine to Sit;Sit to Supine     Supine to sit: Modified independent (Device/Increase time) Sit to supine: Min assist   General bed mobility comments: Min A  for brief need for RLE support   Transfers Overall transfer level: Needs assistance Equipment used: Rolling walker (2 wheeled) Transfers: Sit to/from Stand Sit to Stand: Supervision         General transfer comment: verbal cues for safe hand placement with standing. 2 bouts of standing performed   Ambulation/Gait Ambulation/Gait assistance: Min guard Gait Distance (Feet): 2 Feet Assistive device: Rolling walker (2 wheeled)       General Gait Details: minimal weight bearing on RLE in standing position. patient appears fatigued easily and relying heavily on rolling walker for UE support. no loss of balance or right knee buckling noted in standing. no increased pain is reported with upright activity, standing, or weight bearing. patient does report RLE feels weak in standing position. patient declined ambulation further at this time   Financial trader Rankin (Stroke Patients Only)       Balance Overall balance assessment: Needs assistance Sitting-balance support: Feet unsupported;No upper extremity supported Sitting balance-Leahy Scale: Good     Standing balance support: Bilateral upper extremity supported Standing balance-Leahy Scale: Fair Standing balance comment: patient relying heavily on rolling walker for UE support                              Pertinent Vitals/Pain Pain Assessment: 0-10 Pain Score: 4  Pain Location: right hip  Pain Descriptors / Indicators: Constant Pain Intervention(s): Monitored during session;Limited activity within patient's tolerance (ice offered however patient declined at this time )    Home Living Family/patient  expects to be discharged to:: Private residence Living Arrangements: Spouse/significant other Available Help at Discharge:  (spouse at home but reports unable to help ) Type of Home: House Home Access: [place]: Able to live on main level with  bedroom/bathroom Home Equipment: [place] - 4 wheels;Walker - standard;Cane - single point;Bedside commode;Wheelchair - manual      Prior Function Level of Independence: Independent with assistive device(s)   Gait / Transfers Assistance Needed: patient reports one fall at home recently. patient states she ambulates very short distances using 2 single point canes but has been primarily in the bed recently due to hip and back pain (using "pee cup" in bed)   ADL's / Homemaking Assistance Needed: reports sponge bathing independently recently due to hip and back pain.        Hand Dominance        Extremity/Trunk Assessment   Upper Extremity Assessment Upper Extremity Assessment: Generalized weakness    Lower Extremity Assessment Lower Extremity Assessment: RLE deficits/detail;LLE deficits/detail RLE Deficits / Details: dorsiflexion 4/5, plantarflexion 4+/5, knee extension 4+/5. guarding at times due. fair quad set noted and no knee buckling noted with weight bearing (however patient reports she feels knee will give way with standing)  RLE Sensation: WNL LLE Deficits / Details: WNL  LLE Sensation: WNL       Communication   Communication: No difficulties  Cognition Arousal/Alertness: Awake/alert Behavior During Therapy: WFL for tasks assessed/performed Overall Cognitive Status: Within Functional Limits for tasks assessed                                        General Comments      Exercises     Assessment/Plan    PT Assessment Patient needs continued PT services  PT Problem List Decreased strength;Decreased activity tolerance;Decreased balance;Decreased mobility;Decreased coordination;Decreased cognition;Decreased safety awareness;Pain       PT Treatment Interventions DME instruction;Gait training;Stair training;Functional mobility training;Therapeutic activities;Therapeutic exercise;Balance training;Neuromuscular re-education;Patient/family education     PT Goals (Current goals can be found in the Care Plan section)  Acute Rehab PT Goals Patient Stated Goal: to be independent  PT Goal Formulation: With patient Time For Goal Achievement: 12/29/19 Potential to Achieve Goals: Good    Frequency Min 2X/week   Barriers to discharge        Co-evaluation               AM-PAC PT "6 Clicks" Mobility  Outcome Measure Help needed turning from your back to your side while in a flat bed without using bedrails?: A Little Help needed moving from lying on your back to sitting on the side of a flat bed without using bedrails?: A Little Help needed moving to and from a bed to a chair (including a wheelchair)?: A Little Help needed standing up from a chair using your arms (e.g., wheelchair or bedside chair)?: A Little Help needed to walk in hospital room?: A Little Help needed climbing 3-5 steps with a railing? : A Lot 6 Click Score: 17    End of Session Equipment Utilized During Treatment: Gait belt Activity Tolerance: Patient limited by fatigue Patient left: in bed;with bed alarm set;with call bell/phone within reach Nurse Communication: Mobility status PT Visit Diagnosis: Muscle weakness (generalized) (M62.81);Other abnormalities of gait and mobility (R26.89)    Time: 1093-2355 PT Time Calculation (min) (ACUTE ONLY): 29 min  Charges:   PT Evaluation $PT Eval Low Complexity: 1 Low PT Treatments $Therapeutic Activity: 8-22 mins        Minna Merritts, PT, MPT  Percell Locus 12/15/2019, 12:26 PM

## 2019-12-15 NOTE — ED Notes (Signed)
Report provided to floor. Pt to be transported now.

## 2019-12-15 NOTE — Consult Note (Addendum)
ORTHOPAEDIC CONSULTATION  REQUESTING PHYSICIAN: Bonnielee Haff, MD  Chief Complaint:   Right hip pain.  History of Present Illness: Linda Farley is a 69 y.o. female with a history of hypertension, anxiety/depression, and chronic lower back pain who lives independently.  She has had a long history of lower back pain for which she has seen Dr. Santa Lighter for lumbar epidural injections.  However, more recently, she has been experiencing more right hip/groin pain.  Dr. Santa Lighter performed some x-rays of the right hip which demonstrated advanced degenerative joint disease.  He performed a steroid injection into the right hip joint last week and suggested referral to orthopedics for a right total hip arthroplasty.  Over the past week, the patient has noted progressively worsening right hip/groin pain, especially with any attempted standing or ambulation.  She denies any injury to her hip or lower extremity, and denies any numbness or paresthesias down her leg to her foot.  She also denies any bowel or bladder complaints.  Her symptoms progressed to the point where she was unable to get out of bed.  Therefore, she presented to the emergency room where x-rays demonstrated the advanced degenerative joint disease of the right hip, as well as chronic lumbar degenerative disc disease.  The patient has been admitted for pain control and further work-up.  Past Medical History:  Diagnosis Date  . Allergy   . Anxiety   . Arthritis   . Depression   . Hypertension    Past Surgical History:  Procedure Laterality Date  . ABDOMINAL HYSTERECTOMY    . APPENDECTOMY    . COLONOSCOPY WITH PROPOFOL N/A 09/24/2019   Procedure: COLONOSCOPY WITH PROPOFOL;  Surgeon: Lucilla Lame, MD;  Location: [facility]COPY;  Service: Endoscopy;  Laterality: N/A;  Patient states given Versed for last procedure and woke up during. She is nervous about this happening again.  .  TONSILLECTOMY     Social History   Socioeconomic History  . Marital status: Married    Spouse name: Not on file  . Number of children: Not on file  . Years of education: Not on file  . Highest education level: Not on file  Occupational History  . Not on file  Tobacco Use  . Smoking status: Never Smoker  . Smokeless tobacco: Never Used  Vaping Use  . Vaping Use: Never used  Substance and Sexual Activity  . Alcohol use: Yes    Alcohol/week: 3.0 standard drinks    Types: 3 Glasses of wine per week    Comment: 3 wine coolers weekly  . Drug use: Never  . Sexual activity: Not on file  Other Topics Concern  . Not on file  Social History Narrative  . Not on file   Social Determinants of Health   Financial Resource Strain:   . Difficulty of Paying Living Expenses:   Food Insecurity:   . Worried About Charity fundraiser in the Last Year:   . Arboriculturist in the Last Year:   Transportation Needs:   . Film/video editor (Medical):   Marland Kitchen Lack of Transportation (Non-Medical):   Physical Activity:   . Days of Exercise per Week:   . Minutes of Exercise per Session:   Stress:   . Feeling of Stress :   Social Connections:   . Frequency of Communication with Friends and Family:   . Frequency of Social Gatherings with Friends and Family:   . Attends Religious Services:   . Active Member of  Clubs or Organizations:   . Attends Archivist Meetings:   Marland Kitchen Marital Status:    Family History  Problem Relation Age of Onset  . Prostate cancer Father   . Bone cancer Brother   . Heart disease Brother    Allergies  Allergen Reactions  . Penicillins Rash   Prior to Admission medications   Medication Sig Start Date End Date Taking? Authorizing Provider  albuterol (VENTOLIN HFA) 108 (90 Base) MCG/ACT inhaler INHALE 2 PUFFS BY MOUTH EVERY 6 HOURS AS NEEDED FOR WHEEZING OR SHORTNESS OF BREATH 10/21/19  Yes Malfi, Lupita Raider, FNP  Cholecalciferol (D3 SUPER STRENGTH) 50 MCG (2000  UT) CAPS Take by mouth.   Yes [provider]  citalopram (CELEXA) 20 MG tablet Take 1 tablet by mouth once daily 12/07/19  Yes Malfi, Lupita Raider, FNP  gabapentin (NEURONTIN) 300 MG capsule Take 300 mg by mouth as directed. One capsule in the morning and two capsules every day at bedtime 02/12/19  Yes [provider]  lisinopril-hydrochlorothiazide (ZESTORETIC) 20-25 MG tablet Take 1 tablet by mouth once daily 12/07/19  Yes Malfi, Lupita Raider, FNP  meloxicam (MOBIC) 15 MG tablet Take 1 tablet by mouth once daily 07/20/19  Yes [provider]  traMADol (ULTRAM) 50 MG tablet Take 50 mg by mouth 3 (three) times daily as needed. 02/11/19  Yes [provider]  Magnesium 500 MG TABS Take 1 tablet by mouth daily. Patient not taking: Reported on 12/15/2019    [provider]  predniSONE (DELTASONE) 10 MG tablet  09/16/19   [provider]  Turmeric 1053 MG TABS Take by mouth. Patient not taking: Reported on 12/15/2019    [provider]   DG Chest 2 View  Result Date: 12/14/2019 CLINICAL DATA:  Shortness of breath and chest pain EXAM: CHEST - 2 VIEW COMPARISON:  None. FINDINGS: Cardiac shadows within normal limits. The lungs are well aerated bilaterally. No focal infiltrate or effusion is seen. No acute bony abnormality is noted. IMPRESSION: No acute abnormality noted. Electronically Signed   By: Inez Catalina M.D.   On: 12/14/2019 19:43   CT Lumbar Spine Wo Contrast  Result Date: 12/14/2019 CLINICAL DATA:  Initial evaluation for low back pain, recent fall. EXAM: CT LUMBAR SPINE WITHOUT CONTRAST TECHNIQUE: Multidetector CT imaging of the lumbar spine was performed without intravenous contrast administration. Multiplanar CT image reconstructions were also generated. COMPARISON:  Prior study from 09/17/2011. FINDINGS: Segmentation: Standard. Lowest well-formed disc space labeled the L5-S1 level. Alignment: 5 mm facet mediated anterolisthesis of L4 on L5. Underlying  moderate levoscoliosis with apex at L2-3. Vertebral bodies otherwise normally aligned with preservation of the normal lumbar lordosis. Vertebrae: Vertebral body height maintained without evidence for acute or chronic fracture. Visualized sacrum and pelvis intact. SI joints approximated. No discrete or worrisome osseous lesions. Paraspinal and other soft tissues: Paraspinous soft tissues demonstrate no acute finding. Mild aortic atherosclerosis. Visualized visceral structures within normal limits. Disc levels: L1-2: Minimal disc bulge with bilateral facet hypertrophy. No canal or foraminal stenosis. L2-3: Chronic intervertebral disc space narrowing with diffuse disc bulge and disc desiccation. Prominent right-sided reactive endplate changes with marginal endplate osteophytic spurring. Moderate bilateral facet hypertrophy. Resultant moderate right lateral recess stenosis, with moderate right L2 foraminal narrowing. Left neural foramen remains widely patent. L3-4: Diffuse disc bulge, eccentric to the right. Moderate facet and ligament flavum hypertrophy. Resultant moderate canal with right greater than left lateral recess stenosis. Mild bilateral L3 foraminal narrowing, also greater on  the right. L4-5: 5 mm anterolisthesis, chronic and facet mediated. Associated degenerative intervertebral disc space narrowing with disc occasion and broad posterior pseudo disc bulge/uncovering. Severe bilateral facet arthrosis. Resultant severe spinal stenosis. Moderate left with mild right L4 foraminal narrowing. L5-S1: Diffuse disc bulge with disc desiccation. Disc bulging eccentric to the left. Moderate bilateral facet hypertrophy. No significant canal or lateral recess stenosis. Mild left L5 foraminal narrowing. IMPRESSION: 1. No acute traumatic injury within the lumbar spine. 2. 5 mm facet mediated anterolisthesis of L4 on L5 with resultant severe spinal stenosis. 3. Multifactorial degenerative changes at L2-3 and L3-4 with  resultant moderate right lateral recess and foraminal stenosis at L2-3, with moderate spinal stenosis at L3-4. 4. Underlying moderate levoscoliosis. Electronically Signed   By: Jeannine Boga M.D.   On: 12/14/2019 23:47   MR LUMBAR SPINE WO CONTRAST  Result Date: 12/15/2019 CLINICAL DATA:  Initial evaluation for acute low back pain. EXAM: MRI LUMBAR SPINE WITHOUT CONTRAST TECHNIQUE: Multiplanar, multisequence MR imaging of the lumbar spine was performed. No intravenous contrast was administered. COMPARISON:  Prior CT from 12/14/2019. FINDINGS: Segmentation:  Standard. Alignment: 5 mm facet mediated anterolisthesis of L4 on L5. Trace 2 mm retrolisthesis of L2 on L3. Underlying moderate levoscoliosis with apex at L2-3. Alignment otherwise normal with preservation of the normal lumbar lordosis. Vertebrae: Vertebral body height maintained without evidence for acute or chronic fracture. Bone marrow signal intensity within normal limits. Few scattered benign hemangiomata noted. No worrisome osseous lesions. Mild reactive endplate changes present about the L2-3, L4-5, and L5-S1 interspaces. No abnormal marrow edema. Conus medullaris and cauda equina: Conus extends to the L1 level. Conus and cauda equina appear normal. Paraspinal and other soft tissues: Paraspinous soft tissues within normal limits. Visualized visceral structures are normal. Disc levels: L1-2: Disc desiccation with mild annular disc bulge. Superimposed small left foraminal to extraforaminal disc protrusion contacts the exiting left L1 nerve root as it courses of the left neural foramen (series 13, image 4). No significant spinal stenosis. Mild left foraminal narrowing. L2-3: Trace retrolisthesis. Chronic intervertebral disc space narrowing with diffuse disc bulge and disc desiccation. Disc bulging eccentric to the right with associated prominent right-sided reactive endplate changes. Superimposed moderate right greater than left facet hypertrophy.  Resultant mild canal with moderate right lateral recess stenosis, with moderate right L2 foraminal narrowing. L3-4: Diffuse disc bulge with disc desiccation. Superimposed right foraminal disc protrusion closely approximates the exiting right L3 nerve root as it courses of the right neural foramen (series 10, image 16). Moderate facet arthrosis. Resultant moderate canal with right worse than left lateral recess stenosis. Mild bilateral L3 foraminal stenosis. L4-5: 5 mm anterolisthesis. Chronic intervertebral disc space narrowing with disc desiccation. Broad-based posterior pseudo disc bulge/uncovering, eccentric to the left. Severe facet and ligament flavum hypertrophy. Associated trace bilateral joint effusions. Resultant severe spinal stenosis. Thecal sac measures 3 mm in AP diameter at its most narrow point. Moderate left with mild right L4 foraminal stenosis. L5-S1: Mild disc bulge with disc desiccation. Mild reactive endplate changes, greater on the left. Mild bilateral facet hypertrophy. No significant canal or lateral recess stenosis. Mild left L5 foraminal narrowing. IMPRESSION: 1. No acute abnormality within the lumbar spine. 2. 5 mm facet mediated anterolisthesis of L4 on L5 with resultant severe spinal stenosis. 3. Right foraminal disc protrusion at L3-4, potentially affecting the exiting right L3 nerve root. 4. Right eccentric disc bulge with reactive endplate changes and facet hypertrophy at L2-3 with resultant moderate right foraminal and lateral  recess stenosis. Either the right L2 or descending L3 nerve roots could be affected. 5. Small left foraminal to extraforaminal disc protrusion at L1-2, potentially affecting the exiting left L1 nerve root. Electronically Signed   By: Jeannine Boga M.D.   On: 12/15/2019 03:28   CT Hip Right Wo Contrast  Result Date: 12/14/2019 CLINICAL DATA:  Right hip pain, known degenerative change and recent injury, initial encounter EXAM: CT OF THE RIGHT HIP  WITHOUT CONTRAST TECHNIQUE: Multidetector CT imaging of the right hip was performed according to the standard protocol. Multiplanar CT image reconstructions were also generated. COMPARISON:  Plain film from earlier in the same day. FINDINGS: Bones/Joint/Cartilage Degenerative changes of the right hip joint are noted. Subchondral sclerosis and cyst formation is seen. Ligaments Suboptimally assessed by CT. Muscles and Tendons No muscular abnormality is noted. Soft tissues Surrounding soft tissue structures appear within normal limits with the exception of a small joint effusion in the right hip. Visualized pelvic structures are within normal limits. IMPRESSION: Degenerative changes the right hip joint without acute fracture. No findings to suggest avascular necrosis are noted as suggested on prior plain film. Small right hip joint effusion. Electronically Signed   By: Inez Catalina M.D.   On: 12/14/2019 23:25   US Venous Img Lower Bilateral  Result Date: 12/14/2019 CLINICAL DATA:  Swelling and pain for 2 weeks. EXAM: BILATERAL LOWER EXTREMITY VENOUS DOPPLER ULTRASOUND TECHNIQUE: Gray-scale sonography with compression, as well as color and duplex ultrasound, were performed to evaluate the deep venous system(s) from the level of the common femoral vein through the popliteal and proximal calf veins. COMPARISON:  None. FINDINGS: VENOUS Normal compressibility of the common femoral, superficial femoral, and popliteal veins, as well as the visualized calf veins. Visualized portions of profunda femoral vein and great saphenous vein unremarkable. No filling defects to suggest DVT on grayscale or color Doppler imaging. Doppler waveforms show normal direction of venous flow, normal respiratory plasticity and response to augmentation. OTHER None. Limitations: none IMPRESSION: Negative. Electronically Signed   By: Nolon Nations M.D.   On: 12/14/2019 18:35   DG Hip Unilat  With Pelvis 2-3 Views Right  Result Date:  12/14/2019 CLINICAL DATA:  Recent fall with right hip pain, initial encounter EXAM: DG HIP (WITH OR WITHOUT PELVIS) 3V RIGHT COMPARISON:  None. FINDINGS: Pelvic ring is intact. Degenerative changes of the right hip joint are noted. Some remodeling of the femoral head is seen. Curvilinear lucency is noted in the femoral head superiorly could avascular necrosis. No acute fracture is seen. 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 No acute abnormality noted. Electronically Signed   By: Inez Catalina M.D.   On: 12/14/2019 19:46    Positive ROS: All other systems have been reviewed and were otherwise negative with the exception of those mentioned in the HPI and as above.  Physical Exam: General:  Alert, no acute distress Psychiatric:  Patient is competent for consent with normal mood and affect   Cardiovascular:  No pedal edema Respiratory:  No wheezing, non-labored breathing GI:  Abdomen is soft and non-tender Skin:  No lesions in the area of chief complaint Neurologic:  Sensation intact distally Lymphatic:  No axillary or cervical lymphadenopathy  Orthopedic Exam:  Orthopedic examination is limited to the right hip and lower extremity.  Skin inspection around the right hip is unremarkable.  No swelling, erythema, ecchymosis, abrasions, or other skin abnormalities are identified.  There is no tenderness to palpation around  the anterior or lateral aspects of the right hip.  However, she does have some right hip/groin discomfort with logrolling of the right leg, and has more severe pain with hip flexion to 90 degrees and internal rotation beyond 10 degrees.  She also is able to tolerate external rotation to 45 degrees without pain.  She is able to dorsiflex and plantarflex her toes and ankle.  Sensations intact light touch to all distributions of her right lower extremity and foot.  She has good capillary refill to her right foot.  X-rays:  X-rays of the  pelvis and right hip are available for review, as is a CT scan of the pelvis.  These studies demonstrate advanced degenerative changes of the right hip as manifest by joint space narrowing, early osteophyte formation, and subchondral cystic changes on both the acetabular and femoral sides.  No fractures or other acute bony abnormalities are identified.  These studies have been reviewed by myself and discussed with the patient.  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  Plan: The treatment options have been discussed with the patient.  Based on her x-ray and CT findings, she definitely has significant degenerative joint disease of the right hip and would be a candidate for a right total hip arthroplasty.  Given that she is in the hospital, it would be nice to try to do this as soon as possible.  I am going out of town for the rest of the week.  However, I have discussed this case with my partner, Dr. Rudene Christians, who will try to find some time to perform a hip replacement on her later this week.  Thank you for asking me to participate in the care of this most pleasant yet unfortunate woman.  We will be happy to follow her with you.    Pascal Lux, MD  Beeper #:  [phone]  12/15/2019 3:17 PM   I have subsequently talked with Ms. Kirker regarding hip replacement will come by tomorrow with total hip brochure and bone model to further review this plan on surgery Thursday.

## 2019-12-15 NOTE — H&P (Addendum)
[facility] [place] NAME: Linda Farley    MR#:  [id number]  DATE OF BIRTH:  [birth year]  DATE OF ADMISSION:  12/14/2019  PRIMARY CARE PHYSICIAN: [name], FNP   REQUESTING/REFERRING PHYSICIAN: Duffy Bruce, MD  CHIEF COMPLAINT:   Chief Complaint  Patient presents with  . Weakness  . Shortness of Breath    HISTORY OF PRESENT ILLNESS:  Linda Farley  is a 69 y.o. Caucasian female with a known history of hypertension, depression, spinal stenosis with chronic low back pain, who presented to the emergency room with acute onset of worsening low back pain which has been intractable as well as right hip pain with radiation to her right leg with associated right leg weakness and burning without tingling or numbness.  She denies any fever or chills.  No dysuria, oliguria or hematuria or flank pain.  No urinary or stool incontinence.  No nausea vomiting or abdominal pain.  She tells me she has an appointment for discussion about right hip arthroplasty with Dr. [name] in [place]  clinic coming soon.  Upon presentation to the emergency room, vital signs were within normal except for temperature 99.5 that was later on 90.4.  Blood pressure later on was up to 160/82 and later 141/72.  Labs revealed slight level BUN of 29 with a creatinine of 1.08, high-sensitivity troponin I of 5 and therefore with unremarkable CBC.  COVID-19 PCR came back negative.  UA was unremarkable.EKG showed normal sinus rhythm with a rate of 97 with right bundle branch block. Two-view chest x-ray showed no acute cardiopulmonary disease.  The patient was given 10 mg of IV Decadron and 1 p.o. Percocet.  She will be admitted to a medical bed for further evaluation and management. PAST MEDICAL HISTORY:   Past Medical History:  Diagnosis Date  . Allergy   . Anxiety   . Arthritis   . Depression   . Hypertension     PAST SURGICAL HISTORY:   Past Surgical History:  Procedure Laterality  Date  . ABDOMINAL HYSTERECTOMY    . APPENDECTOMY    . COLONOSCOPY WITH PROPOFOL N/A 09/24/2019   Procedure: COLONOSCOPY WITH PROPOFOL;  Surgeon: Lucilla Lame, MD;  Location: [facility] ENDOSCOPY;  Service: Endoscopy;  Laterality: N/A;  Patient states given Versed for last procedure and woke up during. She is nervous about this happening again.  . TONSILLECTOMY      SOCIAL HISTORY:   Social History   Tobacco Use  . Smoking status: Never Smoker  . Smokeless tobacco: Never Used  Substance Use Topics  . Alcohol use: Yes    Alcohol/week: 3.0 standard drinks    Types: 3 Glasses of wine per week    Comment: 3 wine coolers weekly    FAMILY HISTORY:   Family History  Problem Relation Age of Onset  . Prostate cancer Father   . Bone cancer Brother   . Heart disease Brother     DRUG ALLERGIES:   Allergies  Allergen Reactions  . Penicillins Rash    REVIEW OF SYSTEMS:   ROS As per history of present illness. All pertinent systems were reviewed above. Constitutional,  HEENT, cardiovascular, respiratory, GI, GU, musculoskeletal, neuro, psychiatric, endocrine,  integumentary and hematologic systems were reviewed and are otherwise  negative/unremarkable except for positive findings mentioned above in the HPI.   MEDICATIONS AT HOME:   Prior to Admission medications   Medication Sig Start Date End Date Taking? Authorizing Provider  albuterol (VENTOLIN HFA) 108 (90 Base) MCG/ACT inhaler INHALE 2 PUFFS BY MOUTH EVERY 6 HOURS AS NEEDED FOR WHEEZING OR SHORTNESS OF BREATH 10/21/19  Yes Malfi, Lupita Raider, FNP  Cholecalciferol (D3 SUPER STRENGTH) 50 MCG (2000 UT) CAPS Take by mouth.   Yes [provider]  citalopram (CELEXA) 20 MG tablet Take 1 tablet by mouth once daily 12/07/19  Yes Malfi, Lupita Raider, FNP  gabapentin (NEURONTIN) 300 MG capsule Take 300 mg by mouth as directed. One capsule in the morning and two capsules every day at bedtime 02/12/19  Yes [provider]    HYDROcodone-acetaminophen ([place]) 7.5-325 MG tablet 1/2-1 po q day prn 07/17/19  Yes [provider]  lisinopril-hydrochlorothiazide (ZESTORETIC) 20-25 MG tablet Take 1 tablet by mouth once daily 12/07/19  Yes Malfi, Lupita Raider, FNP  meloxicam (MOBIC) 15 MG tablet Take 1 tablet by mouth once daily 07/20/19  Yes [provider]  traMADol (ULTRAM) 50 MG tablet Take 50 mg by mouth 3 (three) times daily as needed. 02/11/19  Yes [provider]  Magnesium 500 MG TABS Take 1 tablet by mouth daily. Patient not taking: Reported on 12/15/2019    [provider]  predniSONE (DELTASONE) 10 MG tablet  09/16/19   [provider]  Turmeric 1053 MG TABS Take by mouth. Patient not taking: Reported on 12/15/2019    [provider]      VITAL SIGNS:  Blood pressure 134/70, pulse 79, temperature 98.9 F (37.2 C), temperature source Oral, resp. rate 20, height 5\' 5"  (1.651 m), weight 81.6 kg, SpO2 98 %.  PHYSICAL EXAMINATION:  Physical Exam  GENERAL:  69 y.o.-year-old Caucasian female patient lying in the bed with no acute distress.  EYES: Pupils equal, round, reactive to light and accommodation. No scleral icterus. Extraocular muscles intact.  HEENT: Head atraumatic, normocephalic. Oropharynx and nasopharynx clear.  NECK:  Supple, no jugular venous distention. No thyroid enlargement, no tenderness.  LUNGS: Normal breath sounds bilaterally, no wheezing, rales,rhonchi or crepitation. No use of accessory muscles of respiration.  CARDIOVASCULAR: Regular rate and rhythm, S1, S2 normal. No murmurs, rubs, or gallops.  ABDOMEN: Soft, nondistended, nontender. Bowel sounds present. No organomegaly or mass.  EXTREMITIES: No pedal edema, cyanosis, or clubbing. Musculoskeletal: Low back tenderness at L3-L5.  Negative straight leg raising test. NEUROLOGIC: Cranial nerves II through XII are intact. Muscle strength 5/5 in all extremities. Sensation intact. Gait not checked.   PSYCHIATRIC: The patient is alert and oriented x 3.  Normal affect and good eye contact. SKIN: No obvious rash, lesion, or ulcer.   LABORATORY PANEL:   CBC Recent Labs  Lab 12/14/19 1711  WBC 10.0  HGB 13.7  HCT 41.7  PLT 224   ------------------------------------------------------------------------------------------------------------------  Chemistries  Recent Labs  Lab 12/14/19 1711  NA 140  K 3.9  CL 102  CO2 27  GLUCOSE 95  BUN 29*  CREATININE 1.08*  CALCIUM 9.2  AST 17  ALT 12  ALKPHOS 87  BILITOT 0.9   ------------------------------------------------------------------------------------------------------------------  Cardiac Enzymes No results for input(s): TROPONINI in the last 168 hours. ------------------------------------------------------------------------------------------------------------------  RADIOLOGY:  DG Chest 2 View  Result Date: 12/14/2019 CLINICAL DATA:  Shortness of breath and chest pain EXAM: CHEST - 2 VIEW COMPARISON:  None. FINDINGS: Cardiac shadows within normal limits. The lungs are well aerated bilaterally. No focal infiltrate or effusion is seen. No acute bony abnormality is noted. IMPRESSION: No acute abnormality noted. Electronically Signed   By: Linus Mako.D.  On: 12/14/2019 19:43   CT Lumbar Spine Wo Contrast  Result Date: 12/14/2019 CLINICAL DATA:  Initial evaluation for low back pain, recent fall. EXAM: CT LUMBAR SPINE WITHOUT CONTRAST TECHNIQUE: Multidetector CT imaging of the lumbar spine was performed without intravenous contrast administration. Multiplanar CT image reconstructions were also generated. COMPARISON:  Prior study from 09/17/2011. FINDINGS: Segmentation: Standard. Lowest well-formed disc space labeled the L5-S1 level. Alignment: 5 mm facet mediated anterolisthesis of L4 on L5. Underlying moderate levoscoliosis with apex at L2-3. Vertebral bodies otherwise normally aligned with preservation of the normal lumbar  lordosis. Vertebrae: Vertebral body height maintained without evidence for acute or chronic fracture. Visualized sacrum and pelvis intact. SI joints approximated. No discrete or worrisome osseous lesions. Paraspinal and other soft tissues: Paraspinous soft tissues demonstrate no acute finding. Mild aortic atherosclerosis. Visualized visceral structures within normal limits. Disc levels: L1-2: Minimal disc bulge with bilateral facet hypertrophy. No canal or foraminal stenosis. L2-3: Chronic intervertebral disc space narrowing with diffuse disc bulge and disc desiccation. Prominent right-sided reactive endplate changes with marginal endplate osteophytic spurring. Moderate bilateral facet hypertrophy. Resultant moderate right lateral recess stenosis, with moderate right L2 foraminal narrowing. Left neural foramen remains widely patent. L3-4: Diffuse disc bulge, eccentric to the right. Moderate facet and ligament flavum hypertrophy. Resultant moderate canal with right greater than left lateral recess stenosis. Mild bilateral L3 foraminal narrowing, also greater on the right. L4-5: 5 mm anterolisthesis, chronic and facet mediated. Associated degenerative intervertebral disc space narrowing with disc occasion and broad posterior pseudo disc bulge/uncovering. Severe bilateral facet arthrosis. Resultant severe spinal stenosis. Moderate left with mild right L4 foraminal narrowing. L5-S1: Diffuse disc bulge with disc desiccation. Disc bulging eccentric to the left. Moderate bilateral facet hypertrophy. No significant canal or lateral recess stenosis. Mild left L5 foraminal narrowing. IMPRESSION: 1. No acute traumatic injury within the lumbar spine. 2. 5 mm facet mediated anterolisthesis of L4 on L5 with resultant severe spinal stenosis. 3. Multifactorial degenerative changes at L2-3 and L3-4 with resultant moderate right lateral recess and foraminal stenosis at L2-3, with moderate spinal stenosis at L3-4. 4. Underlying  moderate levoscoliosis. Electronically Signed   By: Jeannine Boga M.D.   On: 12/14/2019 23:47   CT Hip Right Wo Contrast  Result Date: 12/14/2019 CLINICAL DATA:  Right hip pain, known degenerative change and recent injury, initial encounter EXAM: CT OF THE RIGHT HIP WITHOUT CONTRAST TECHNIQUE: Multidetector CT imaging of the right hip was performed according to the standard protocol. Multiplanar CT image reconstructions were also generated. COMPARISON:  Plain film from earlier in the same day. FINDINGS: Bones/Joint/Cartilage Degenerative changes of the right hip joint are noted. Subchondral sclerosis and cyst formation is seen. Ligaments Suboptimally assessed by CT. Muscles and Tendons No muscular abnormality is noted. Soft tissues Surrounding soft tissue structures appear within normal limits with the exception of a small joint effusion in the right hip. Visualized pelvic structures are within normal limits. IMPRESSION: Degenerative changes the right hip joint without acute fracture. No findings to suggest avascular necrosis are noted as suggested on prior plain film. Small right hip joint effusion. Electronically Signed   By: Inez Catalina M.D.   On: 12/14/2019 23:25   US Venous Img Lower Bilateral  Result Date: 12/14/2019 CLINICAL DATA:  Swelling and pain for 2 weeks. EXAM: BILATERAL LOWER EXTREMITY VENOUS DOPPLER ULTRASOUND TECHNIQUE: Gray-scale sonography with compression, as well as color and duplex ultrasound, were performed to evaluate the deep venous system(s) from the level of the common femoral  vein through the popliteal and proximal calf veins. COMPARISON:  None. FINDINGS: VENOUS Normal compressibility of the common femoral, superficial femoral, and popliteal veins, as well as the visualized calf veins. Visualized portions of profunda femoral vein and great saphenous vein unremarkable. No filling defects to suggest DVT on grayscale or color Doppler imaging. Doppler waveforms show normal  direction of venous flow, normal respiratory plasticity and response to augmentation. OTHER None. Limitations: none IMPRESSION: Negative. Electronically Signed   By: Nolon Nations M.D.   On: 12/14/2019 18:35   DG Hip Unilat  With Pelvis 2-3 Views Right  Result Date: 12/14/2019 CLINICAL DATA:  Recent fall with right hip pain, initial encounter EXAM: DG HIP (WITH OR WITHOUT PELVIS) 3V RIGHT COMPARISON:  None. FINDINGS: Pelvic ring is intact. Degenerative changes of the right hip joint are noted. Some remodeling of the femoral head is seen. Curvilinear lucency is noted in the femoral head superiorly could avascular necrosis. No acute fracture is seen. 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 No acute abnormality noted. Electronically Signed   By: Inez Catalina M.D.   On: 12/14/2019 19:46      IMPRESSION AND PLAN:   1.  Intractable low back pain as well as right hip pain with associated right lower extremity radiculopathy. -The patient will be admitted to a medical bed. -An MRI of the LS spine was ordered and revealed the following: 1. No acute abnormality within the lumbar spine. 2. 5 mm facet mediated anterolisthesis of L4 on L5 with resultant severe spinal stenosis. 3. Right foraminal disc protrusion at L3-4, potentially affecting the exiting right L3 nerve root. 4. Right eccentric disc bulge with reactive endplate changes and facet hypertrophy at L2-3 with resultant moderate right foraminal and lateral recess stenosis. Either the right L2 or descending L3 nerve roots could be affected. 5. Small left foraminal to extraforaminal disc protrusion at L1-2, potentially affecting the exiting left L1 nerve root. -A neurosurgery consultation will be obtained for further assessment. -I notified Dr. Lacinda Axon about the patient. -Pain management will be provided. -We will continue steroid therapy with IV Decadron. -Physical therapy consult will be  obtained. -Orthopedic consultation will be obtained for potential need for right total hip arthroplasty. I notified Dr. Roland Rack about the patient.  2.  Hypertension. -We will continue her antihypertensives.  3.  Peripheral neuropathy. -Continue Neurontin.  4.  Depression. -We will continue her Celexa.  5.  DVT prophylaxis. -Subcutaneous Lovenox.  All the records are reviewed and case discussed with ED provider. The plan of care was discussed in details with the patient (and family). I answered all questions. The patient agreed to proceed with the above mentioned plan. Further management will depend upon hospital course.   CODE STATUS: Full code  Status is: Inpatient  Remains inpatient appropriate because:Ongoing active pain requiring inpatient pain management, Altered mental status, Ongoing diagnostic testing needed not appropriate for outpatient work up, Unsafe d/c plan, IV treatments appropriate due to intensity of illness or inability to take PO and Inpatient level of care appropriate due to severity of illness   Dispo: The patient is from: Home              Anticipated d/c is to: Home              Anticipated d/c date is: 2 days              Patient currently is not medically stable to d/c.  TOTAL TIME TAKING CARE OF THIS PATIENT: 55 minutes.    Christel Mormon M.D on 12/15/2019 at 1:35 AM  [facility]   From 7 PM-7 AM, contact night-coverage [email or web address]  CC: Primary care physician; [name], FNP   Note: This dictation was prepared with Dragon dictation along with smaller phrase technology. Any transcriptional typo errors that result from this process are unintentional.

## 2019-12-15 NOTE — Consult Note (Signed)
Neurosurgery-New Consultation Evaluation 12/15/2019 Linda Farley [id number]  Identifying Statement: Linda Farley is a 69 y.o. female from [place] [id number] with back and right hip pain  Physician Requesting Consultation: Dr. Eugenie Norrie  History of Present Illness: Linda Farley is admitted to the hospital after presenting to the emergency department with worsened and right hip pain.  She does have a history of chronic pain in the back and right leg.  She has been seeing a physiatrist for the past 3 years at [facility] with most recent injection in June of this year in the right hip.  She states that she has had chronic pain in the low back but over the last few months the right hip has been more bothersome.  The injection did help but then she did have a fall more recently afterwards which increased her pain.  She does state that the pain will go down the leg is sometimes towards the foot but not frequently.  The pain is centered around the right lateral thigh area.  She denies any numbness in her legs.  She denies any left leg pain.  She was finding it difficult to walk given the extreme pain and felt that she has been using her left leg more.  Given this, she did present to the emergency department where a MRI of the lumbar spine was obtained and she was given steroid and pain medication.  This morning, she does feel like the pain has improved dramatically.  She has not ambulated yet today.  Past Medical History:  Past Medical History:  Diagnosis Date  . Allergy   . Anxiety   . Arthritis   . Depression   . Hypertension     Social History: Social History   Socioeconomic History  . Marital status: Married    Spouse name: Not on file  . Number of children: Not on file  . Years of education: Not on file  . Highest education level: Not on file  Occupational History  . Not on file  Tobacco Use  . Smoking status: Never Smoker  . Smokeless tobacco: Never Used  Vaping Use  . Vaping  Use: Never used  Substance and Sexual Activity  . Alcohol use: Yes    Alcohol/week: 3.0 standard drinks    Types: 3 Glasses of wine per week    Comment: 3 wine coolers weekly  . Drug use: Never  . Sexual activity: Not on file  Other Topics Concern  . Not on file  Social History Narrative  . Not on file   Social Determinants of Health   Financial Resource Strain:   . Difficulty of Paying Living Expenses:   Food Insecurity:   . Worried About Charity fundraiser in the Last Year:   . Arboriculturist in the Last Year:   Transportation Needs:   . Film/video editor (Medical):   Marland Kitchen Lack of Transportation (Non-Medical):   Physical Activity:   . Days of Exercise per Week:   . Minutes of Exercise per Session:   Stress:   . Feeling of Stress :   Social Connections:   . Frequency of Communication with Friends and Family:   . Frequency of Social Gatherings with Friends and Family:   . Attends Religious Services:   . Active Member of Clubs or Organizations:   . Attends Archivist Meetings:   Marland Kitchen Marital Status:    Family History: Family History  Problem Relation Age of  Onset  . Prostate cancer Father   . Bone cancer Brother   . Heart disease Brother     Review of Systems:  Review of Systems - General ROS: Negative Psychological ROS: Negative Ophthalmic ROS: Negative ENT ROS: Negative Hematological and Lymphatic ROS: Negative  Endocrine ROS: Negative Respiratory ROS: Negative Cardiovascular ROS: Negative Gastrointestinal ROS: Negative Genito-Urinary ROS: Negative Musculoskeletal ROS: Positive for back and right hip pain Neurological ROS: Negative for numbness Dermatological ROS: Negative  Physical Exam: BP 127/70 (BP Location: Right Arm)   Pulse 83   Temp 98 F (36.7 C) (Oral)   Resp 18   Ht 5\' 5"  (1.651 m)   Wt 81.6 kg   SpO2 96%   BMI 29.95 kg/m  Body mass index is 29.95 kg/m. Body surface area is 1.93 meters squared. General appearance: Alert,  cooperative, in no acute distress, lying supine in bed Head: Normocephalic, atraumatic Eyes: Normal, EOM intact Oropharynx: Moist without lesions Ext: Edema noted in bilateral lower extremities above the ankle  Neurologic exam:  Mental status: alertness: alert, affect: normal Speech: fluent and clear Motor:strength symmetric 5/5 in bilateral hip flexion, knee extension, knee flexion, dorsiflexion, plantarflexion.  There is some hesitancy on the right leg but full strength on testing Sensory: intact to light touch in bilateral lower extremities Gait: Not tested  Laboratory: Results for orders placed or performed during the hospital encounter of 12/14/19  SARS Coronavirus 2 by RT PCR (hospital order, performed in [facility] hospital lab) Nasopharyngeal Nasopharyngeal Swab   Specimen: Nasopharyngeal Swab  Result Value Ref Range   SARS Coronavirus 2 NEGATIVE NEGATIVE  CBC  Result Value Ref Range   WBC 10.0 4.0 - 10.5 K/uL   RBC 4.53 3.87 - 5.11 MIL/uL   Hemoglobin 13.7 12.0 - 15.0 g/dL   HCT 41.7 36 - 46 %   MCV 92.1 80.0 - 100.0 fL   MCH 30.2 26.0 - 34.0 pg   MCHC 32.9 30.0 - 36.0 g/dL   RDW 14.3 11.5 - 15.5 %   Platelets 224 150 - 400 K/uL   nRBC 0.0 0.0 - 0.2 %  Urinalysis, Complete w Microscopic  Result Value Ref Range   Color, Urine YELLOW (A) YELLOW   APPearance HAZY (A) CLEAR   Specific Gravity, Urine 1.018 1.005 - 1.030   pH 6.0 5.0 - 8.0   Glucose, UA NEGATIVE NEGATIVE mg/dL   Hgb urine dipstick NEGATIVE NEGATIVE   Bilirubin Urine NEGATIVE NEGATIVE   Ketones, ur 20 (A) NEGATIVE mg/dL   Protein, ur NEGATIVE NEGATIVE mg/dL   Nitrite NEGATIVE NEGATIVE   Leukocytes,Ua NEGATIVE NEGATIVE   RBC / HPF 0-5 0 - 5 RBC/hpf   WBC, UA 0-5 0 - 5 WBC/hpf   Bacteria, UA RARE (A) NONE SEEN   Squamous Epithelial / LPF 0-5 0 - 5  Comprehensive metabolic panel  Result Value Ref Range   Sodium 140 135 - 145 mmol/L   Potassium 3.9 3.5 - 5.1 mmol/L   Chloride 102 98 - 111 mmol/L    CO2 27 22 - 32 mmol/L   Glucose, Bld 95 70 - 99 mg/dL   BUN 29 (H) 8 - 23 mg/dL   Creatinine, Ser 1.08 (H) 0.44 - 1.00 mg/dL   Calcium 9.2 8.9 - 10.3 mg/dL   Total Protein 6.8 6.5 - 8.1 g/dL   Albumin 3.9 3.5 - 5.0 g/dL   AST 17 15 - 41 U/L   ALT 12 0 - 44 U/L   Alkaline Phosphatase 87  38 - 126 U/L   Total Bilirubin 0.9 0.3 - 1.2 mg/dL   GFR calc non Af Amer 52 (L) >60 mL/min   GFR calc Af Amer >60 >60 mL/min   Anion gap 11 5 - 15  Basic metabolic panel  Result Value Ref Range   Sodium 141 135 - 145 mmol/L   Potassium 4.3 3.5 - 5.1 mmol/L   Chloride 103 98 - 111 mmol/L   CO2 28 22 - 32 mmol/L   Glucose, Bld 116 (H) 70 - 99 mg/dL   BUN 28 (H) 8 - 23 mg/dL   Creatinine, Ser 0.93 0.44 - 1.00 mg/dL   Calcium 9.0 8.9 - 10.3 mg/dL   GFR calc non Af Amer >60 >60 mL/min   GFR calc Af Amer >60 >60 mL/min   Anion gap 10 5 - 15  CBC  Result Value Ref Range   WBC 9.2 4.0 - 10.5 K/uL   RBC 4.12 3.87 - 5.11 MIL/uL   Hemoglobin 12.5 12.0 - 15.0 g/dL   HCT 37.5 36 - 46 %   MCV 91.0 80.0 - 100.0 fL   MCH 30.3 26.0 - 34.0 pg   MCHC 33.3 30.0 - 36.0 g/dL   RDW 14.2 11.5 - 15.5 %   Platelets 218 150 - 400 K/uL   nRBC 0.0 0.0 - 0.2 %  Troponin I (High Sensitivity)  Result Value Ref Range   Troponin I (High Sensitivity) 5 <18 ng/L  Troponin I (High Sensitivity)  Result Value Ref Range   Troponin I (High Sensitivity) 4 <18 ng/L   I personally reviewed labs  Imaging: MRI lumbar spine: There is a normal lordotic curvature.  There is diffuse degenerative disease with a severe degenerative collapse of the disc at L2-3.  There is a grade 1 spondylolisthesis at L4-5.  At L2-3, there is a small right disc osteophyte complex which causes lateral recess stenosis.  At L3-4, there is an extraforaminal disc herniation on the right which does abut the nerve root but does not cause any foraminal stenosis.  At L4-5, there is severe central stenosis due to the listhesis as well as mild foraminal stenosis.   At L5-S1, there is no obvious central or foraminal stenosis.   Impression/Plan:  Linda Farley is admitted for severe back and leg pain which does seem improved today.  I discussed with her the MRI does show the known degenerative disease and spinal listhesis.  She had been previously evaluated for spine surgery years ago but declined.  It does appear that her symptoms are centered around more of the right hip at this time.  This could be due to local pathology of the hip versus some of the right side of stenosis on MRI.  I do think any intervention in the back will have to consist of a large decompression and fusion given the multilevel degenerative disease and alignment changes.  Given this, I do recommend first attempting conservative management of the low back but do recommend orthopedics evaluation of the right hip.  She should have follow-up with her physiatrist to see if any other injections are warranted.  If not, I am glad to see her in clinic to discuss any spine interventions.   1.  Diagnosis: Lumbar degenerative disease, spinal stenosis  2.  Plan -Recommend orthopedic evaluation of the right hip -Recommend follow-up with her physiatrist -Can see in clinic as needed

## 2019-12-16 LAB — TYPE AND SCREEN
ABO/RH(D): B NEG
Antibody Screen: NEGATIVE

## 2019-12-16 LAB — SURGICAL PCR SCREEN
MRSA, PCR: NEGATIVE
Staphylococcus aureus: NEGATIVE

## 2019-12-16 LAB — ABO/RH: ABO/RH(D): B NEG

## 2019-12-16 MED ORDER — CEFAZOLIN SODIUM-DEXTROSE 2-4 GM/100ML-% IV SOLN
2.0000 g | Freq: Once | INTRAVENOUS | Status: AC
  Administered 2019-12-17: 2 g via INTRAVENOUS
  Filled 2019-12-16: qty 100

## 2019-12-16 MED ORDER — FLEET ENEMA 7-19 GM/118ML RE ENEM: 1.0000 | ENEMA | Freq: Every day | RECTAL | Status: DC | PRN

## 2019-12-16 MED ORDER — POLYETHYLENE GLYCOL 3350 17 G PO PACK
17.0000 g | PACK | Freq: Every day | ORAL | Status: DC
  Administered 2019-12-16 – 2019-12-23 (×6): 17 g via ORAL
  Filled 2019-12-16 (×7): qty 1

## 2019-12-16 NOTE — Progress Notes (Signed)
[facility] PROGRESS NOTE   Linda Farley WUX:[id number] DOB: September 11, 1950 DOA: 12/14/2019  PCP: [name], FNP  Brief History/Interval Summary: 69 y.o. Caucasian female with a known history of hypertension, depression, spinal stenosis with chronic low back pain, who presented to the emergency room with acute onset of worsening low back pain which has been intractable as well as right hip pain with radiation to her right leg with associated right leg weakness and burning without tingling or numbness.  She has an appointment for discussion about right hip arthroplasty with Dr. [name] in [place] [facility] coming soon.  Patient was unable to weight-bear and ambulate.  She was hospitalized.  Neurosurgery as well as orthopedic surgery was consulted.  Reason for Visit: Significant pain in the right hip with inability to bear weight.  Consultants: Neurosurgery.  Orthopedics.  Procedures: None yet  Antibiotics: Anti-infectives (From admission, onward)   Start     Dose/Rate Route Frequency Ordered Stop   12/17/19 1200  ceFAZolin (ANCEF) IVPB 2g/100 mL premix     Discontinue     2 g 200 mL/hr over 30 Minutes Intravenous  Once 12/16/19 0745        Subjective/Interval History: Patient states that her pain is reasonably well controlled.  She was seen by orthopedics yesterday.  She tells me that she is going to undergo hip replacement surgery tomorrow.  She is complaining of constipation.  ROS: Denies any cough or shortness of breath.    Assessment/Plan:  Intractable right hip pain with inability to bear weight and ambulate Patient known to have arthritis.  She was supposed to see orthopedics in the outpatient setting to discuss hip joint replacement.  She is unable to bear weight on the right side.  Examination does not suggest any fractures.  She did have a CT scan done yesterday which does not show any fractures in that area.  Patient seen by orthopedics.  Plan is for total hip  replacement tomorrow.  Check labs tomorrow.  Chronic low back pain with radiculopathy Seen by neurosurgery.  MRI of the lumbar spine was done which does not show any acute findings.  She was given intravenous Decadron.  Her back issues appear to be stable.  No plans for any surgical intervention currently.  Follow-up with her physiatrist.  Essential hypertension Blood pressure is reasonably well controlled.  Patient is on lisinopril/HCTZ.  Will hold the HCTZ component.    Peripheral neuropathy Continue with gabapentin.  History of depression Continue with citalopram   DVT Prophylaxis: Lovenox Code Status: Full code Family Communication: Discussed with the patient Disposition Plan:  Status is: Inpatient  Remains inpatient appropriate because:Ongoing active pain requiring inpatient pain management   Dispo: The patient is from: Home              Anticipated d/c is to: Home              Anticipated d/c date is: 2 days              Patient currently is not medically stable to d/c.    Medications:  Scheduled: . citalopram  20 mg Oral Daily  . dexamethasone (DECADRON) injection  4 mg Intravenous Q12H  . enoxaparin (LOVENOX) injection  40 mg Subcutaneous Q24H  . gabapentin  300 mg Oral Daily  . gabapentin  600 mg Oral QHS  . lisinopril  20 mg Oral Daily   And  . hydrochlorothiazide  25 mg Oral Daily  . magnesium oxide  400 mg Oral Daily  . meloxicam  15 mg Oral Daily  . polyethylene glycol  17 g Oral Daily   Continuous: . sodium chloride 100 mL/hr at 12/15/19 0249  . [START ON 12/17/2019]  ceFAZolin (ANCEF) IV     TKW:[id number] **OR** acetaminophen, albuterol, HYDROcodone-acetaminophen, ketorolac, magnesium hydroxide, morphine injection, ondansetron **OR** ondansetron (ZOFRAN) IV, sodium phosphate, traMADol, traZODone   Objective:  Vital Signs  Vitals:   12/15/19 1604 12/15/19 2017 12/15/19 2307 12/16/19 0810  BP: 116/73 124/73 111/63 (!) 141/69  Pulse: 88 85 77 61   Resp: 18  18 18   Temp: 98.7 F (37.1 C) 98.5 F (36.9 C) 98.2 F (36.8 C) 98.4 F (36.9 C)  TempSrc: Oral Oral Oral Oral  SpO2: 95% 96% 92% 97%  Weight:      Height:        Intake/Output Summary (Last 24 hours) at 12/16/2019 1211 Last data filed at 12/16/2019 1100 Gross per 24 hour  Intake 600 ml  Output 1300 ml  Net -700 ml   Filed Weights   12/14/19 1710  Weight: 81.6 kg    General appearance: Awake alert.  In no distress Resp: Clear to auscultation bilaterally.  Normal effort Cardio: S1-S2 is normal regular.  No S3-S4.  No rubs murmurs or bruit GI: Abdomen is soft.  Nontender nondistended.  Bowel sounds are present normal.  No masses organomegaly Extremities: No edema.  Restricted range of motion of the right lower extremity. Neurologic: Alert and oriented x3.  No focal neurological deficits.       Lab Results:  Data Reviewed: I have personally reviewed following labs and imaging studies  CBC: Recent Labs  Lab 12/14/19 1711 12/15/19 0507  WBC 10.0 9.2  HGB 13.7 12.5  HCT 41.7 37.5  MCV 92.1 91.0  PLT 224 683    Basic Metabolic Panel: Recent Labs  Lab 12/14/19 1711 12/15/19 0507  NA 140 141  K 3.9 4.3  CL 102 103  CO2 27 28  GLUCOSE 95 116*  BUN 29* 28*  CREATININE 1.08* 0.93  CALCIUM 9.2 9.0    GFR: Estimated Creatinine Clearance: 60.2 mL/min (by C-G formula based on SCr of 0.93 mg/dL).  Liver Function Tests: Recent Labs  Lab 12/14/19 1711  AST 17  ALT 12  ALKPHOS 87  BILITOT 0.9  PROT 6.8  ALBUMIN 3.9     Recent Results (from the past 240 hour(s))  SARS Coronavirus 2 by RT PCR (hospital order, performed in [facility] hospital lab) Nasopharyngeal Nasopharyngeal Swab     Status: None   Collection Time: 12/15/19 12:35 AM   Specimen: Nasopharyngeal Swab  Result Value Ref Range Status   SARS Coronavirus 2 NEGATIVE NEGATIVE Final    Comment: (NOTE) SARS-CoV-2 target nucleic acids are NOT DETECTED.  The SARS-CoV-2 RNA is generally  detectable in upper and lower respiratory specimens during the acute phase of infection. The lowest concentration of SARS-CoV-2 viral copies this assay can detect is 250 copies / mL. A negative result does not preclude SARS-CoV-2 infection and should not be used as the sole basis for treatment or other patient management decisions.  A negative result may occur with improper specimen collection / handling, submission of specimen other than nasopharyngeal swab, presence of viral mutation(s) within the areas targeted by this assay, and inadequate number of viral copies (<250 copies / mL). A negative result must be combined with clinical observations, patient history, and epidemiological information.  Fact Sheet for Patients:   StrictlyIdeas.no  Fact Sheet for Healthcare Providers: BankingDealers.co.za  This test is not yet approved or  cleared by the [place] FDA and has been authorized for detection and/or diagnosis of SARS-CoV-2 by FDA under an Emergency Use Authorization (EUA).  This EUA will remain in effect (meaning this test can be used) for the duration of the COVID-19 declaration under Section 564(b)(1) of the Act, 21 U.S.C. section 360bbb-3(b)(1), unless the authorization is terminated or revoked sooner.  Performed at [facility], [address], [place], [place] [address]       Radiology Studies: DG Chest 2 View  Result Date: 12/14/2019 CLINICAL DATA:  Shortness of breath and chest pain EXAM: CHEST - 2 VIEW COMPARISON:  None. FINDINGS: Cardiac shadows within normal limits. The lungs are well aerated bilaterally. No focal infiltrate or effusion is seen. No acute bony abnormality is noted. IMPRESSION: No acute abnormality noted. Electronically Signed   By: Inez Catalina M.D.   On: 12/14/2019 19:43   CT Lumbar Spine Wo Contrast  Result Date: 12/14/2019 CLINICAL DATA:  Initial evaluation for low back pain, recent  fall. EXAM: CT LUMBAR SPINE WITHOUT CONTRAST TECHNIQUE: Multidetector CT imaging of the lumbar spine was performed without intravenous contrast administration. Multiplanar CT image reconstructions were also generated. COMPARISON:  Prior study from 09/17/2011. FINDINGS: Segmentation: Standard. Lowest well-formed disc space labeled the L5-S1 level. Alignment: 5 mm facet mediated anterolisthesis of L4 on L5. Underlying moderate levoscoliosis with apex at L2-3. Vertebral bodies otherwise normally aligned with preservation of the normal lumbar lordosis. Vertebrae: Vertebral body height maintained without evidence for acute or chronic fracture. Visualized sacrum and pelvis intact. SI joints approximated. No discrete or worrisome osseous lesions. Paraspinal and other soft tissues: Paraspinous soft tissues demonstrate no acute finding. Mild aortic atherosclerosis. Visualized visceral structures within normal limits. Disc levels: L1-2: Minimal disc bulge with bilateral facet hypertrophy. No canal or foraminal stenosis. L2-3: Chronic intervertebral disc space narrowing with diffuse disc bulge and disc desiccation. Prominent right-sided reactive endplate changes with marginal endplate osteophytic spurring. Moderate bilateral facet hypertrophy. Resultant moderate right lateral recess stenosis, with moderate right L2 foraminal narrowing. Left neural foramen remains widely patent. L3-4: Diffuse disc bulge, eccentric to the right. Moderate facet and ligament flavum hypertrophy. Resultant moderate canal with right greater than left lateral recess stenosis. Mild bilateral L3 foraminal narrowing, also greater on the right. L4-5: 5 mm anterolisthesis, chronic and facet mediated. Associated degenerative intervertebral disc space narrowing with disc occasion and broad posterior pseudo disc bulge/uncovering. Severe bilateral facet arthrosis. Resultant severe spinal stenosis. Moderate left with mild right L4 foraminal narrowing. L5-S1:  Diffuse disc bulge with disc desiccation. Disc bulging eccentric to the left. Moderate bilateral facet hypertrophy. No significant canal or lateral recess stenosis. Mild left L5 foraminal narrowing. IMPRESSION: 1. No acute traumatic injury within the lumbar spine. 2. 5 mm facet mediated anterolisthesis of L4 on L5 with resultant severe spinal stenosis. 3. Multifactorial degenerative changes at L2-3 and L3-4 with resultant moderate right lateral recess and foraminal stenosis at L2-3, with moderate spinal stenosis at L3-4. 4. Underlying moderate levoscoliosis. Electronically Signed   By: Jeannine Boga M.D.   On: 12/14/2019 23:47   MR LUMBAR SPINE WO CONTRAST  Result Date: 12/15/2019 CLINICAL DATA:  Initial evaluation for acute low back pain. EXAM: MRI LUMBAR SPINE WITHOUT CONTRAST TECHNIQUE: Multiplanar, multisequence MR imaging of the lumbar spine was performed. No intravenous contrast was administered. COMPARISON:  Prior CT from 12/14/2019. FINDINGS: Segmentation:  Standard. Alignment: 5 mm facet mediated  anterolisthesis of L4 on L5. Trace 2 mm retrolisthesis of L2 on L3. Underlying moderate levoscoliosis with apex at L2-3. Alignment otherwise normal with preservation of the normal lumbar lordosis. Vertebrae: Vertebral body height maintained without evidence for acute or chronic fracture. Bone marrow signal intensity within normal limits. Few scattered benign hemangiomata noted. No worrisome osseous lesions. Mild reactive endplate changes present about the L2-3, L4-5, and L5-S1 interspaces. No abnormal marrow edema. Conus medullaris and cauda equina: Conus extends to the L1 level. Conus and cauda equina appear normal. Paraspinal and other soft tissues: Paraspinous soft tissues within normal limits. Visualized visceral structures are normal. Disc levels: L1-2: Disc desiccation with mild annular disc bulge. Superimposed small left foraminal to extraforaminal disc protrusion contacts the exiting left L1 nerve  root as it courses of the left neural foramen (series 13, image 4). No significant spinal stenosis. Mild left foraminal narrowing. L2-3: Trace retrolisthesis. Chronic intervertebral disc space narrowing with diffuse disc bulge and disc desiccation. Disc bulging eccentric to the right with associated prominent right-sided reactive endplate changes. Superimposed moderate right greater than left facet hypertrophy. Resultant mild canal with moderate right lateral recess stenosis, with moderate right L2 foraminal narrowing. L3-4: Diffuse disc bulge with disc desiccation. Superimposed right foraminal disc protrusion closely approximates the exiting right L3 nerve root as it courses of the right neural foramen (series 10, image 16). Moderate facet arthrosis. Resultant moderate canal with right worse than left lateral recess stenosis. Mild bilateral L3 foraminal stenosis. L4-5: 5 mm anterolisthesis. Chronic intervertebral disc space narrowing with disc desiccation. Broad-based posterior pseudo disc bulge/uncovering, eccentric to the left. Severe facet and ligament flavum hypertrophy. Associated trace bilateral joint effusions. Resultant severe spinal stenosis. Thecal sac measures 3 mm in AP diameter at its most narrow point. Moderate left with mild right L4 foraminal stenosis. L5-S1: Mild disc bulge with disc desiccation. Mild reactive endplate changes, greater on the left. Mild bilateral facet hypertrophy. No significant canal or lateral recess stenosis. Mild left L5 foraminal narrowing. IMPRESSION: 1. No acute abnormality within the lumbar spine. 2. 5 mm facet mediated anterolisthesis of L4 on L5 with resultant severe spinal stenosis. 3. Right foraminal disc protrusion at L3-4, potentially affecting the exiting right L3 nerve root. 4. Right eccentric disc bulge with reactive endplate changes and facet hypertrophy at L2-3 with resultant moderate right foraminal and lateral recess stenosis. Either the right L2 or descending  L3 nerve roots could be affected. 5. Small left foraminal to extraforaminal disc protrusion at L1-2, potentially affecting the exiting left L1 nerve root. Electronically Signed   By: Jeannine Boga M.D.   On: 12/15/2019 03:28   CT Hip Right Wo Contrast  Result Date: 12/14/2019 CLINICAL DATA:  Right hip pain, known degenerative change and recent injury, initial encounter EXAM: CT OF THE RIGHT HIP WITHOUT CONTRAST TECHNIQUE: Multidetector CT imaging of the right hip was performed according to the standard protocol. Multiplanar CT image reconstructions were also generated. COMPARISON:  Plain film from earlier in the same day. FINDINGS: Bones/Joint/Cartilage Degenerative changes of the right hip joint are noted. Subchondral sclerosis and cyst formation is seen. Ligaments Suboptimally assessed by CT. Muscles and Tendons No muscular abnormality is noted. Soft tissues Surrounding soft tissue structures appear within normal limits with the exception of a small joint effusion in the right hip. Visualized pelvic structures are within normal limits. IMPRESSION: Degenerative changes the right hip joint without acute fracture. No findings to suggest avascular necrosis are noted as suggested on prior plain film. Small  right hip joint effusion. Electronically Signed   By: Inez Catalina M.D.   On: 12/14/2019 23:25   US Venous Img Lower Bilateral  Result Date: 12/14/2019 CLINICAL DATA:  Swelling and pain for 2 weeks. EXAM: BILATERAL LOWER EXTREMITY VENOUS DOPPLER ULTRASOUND TECHNIQUE: Gray-scale sonography with compression, as well as color and duplex ultrasound, were performed to evaluate the deep venous system(s) from the level of the common femoral vein through the popliteal and proximal calf veins. COMPARISON:  None. FINDINGS: VENOUS Normal compressibility of the common femoral, superficial femoral, and popliteal veins, as well as the visualized calf veins. Visualized portions of profunda femoral vein and great  saphenous vein unremarkable. No filling defects to suggest DVT on grayscale or color Doppler imaging. Doppler waveforms show normal direction of venous flow, normal respiratory plasticity and response to augmentation. OTHER None. Limitations: none IMPRESSION: Negative. Electronically Signed   By: Nolon Nations M.D.   On: 12/14/2019 18:35   DG Hip Unilat  With Pelvis 2-3 Views Right  Result Date: 12/14/2019 CLINICAL DATA:  Recent fall with right hip pain, initial encounter EXAM: DG HIP (WITH OR WITHOUT PELVIS) 3V RIGHT COMPARISON:  None. FINDINGS: Pelvic ring is intact. Degenerative changes of the right hip joint are noted. Some remodeling of the femoral head is seen. Curvilinear lucency is noted in the femoral head superiorly could avascular necrosis. No acute fracture is seen. 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 No acute abnormality noted. Electronically Signed   By: Inez Catalina M.D.   On: 12/14/2019 19:46       LOS: 1 day   [facility] Pager on [email or web address]  12/16/2019, 12:11 PM

## 2019-12-16 NOTE — Anesthesia Preprocedure Evaluation (Addendum)
Anesthesia Evaluation  Patient identified by MRN, date of birth, ID band Patient awake    Reviewed: Allergy & Precautions, H&P , NPO status , Patient's Chart, lab work & pertinent test results, reviewed documented beta blocker date and time   Airway Mallampati: II   Neck ROM: full    Dental  (+) Poor Dentition   Pulmonary neg pulmonary ROS,    Pulmonary exam normal        Cardiovascular hypertension, On Medications negative cardio ROS Normal cardiovascular exam Rhythm:regular Rate:Normal     Neuro/Psych PSYCHIATRIC DISORDERS Anxiety Depression  Neuromuscular disease    GI/Hepatic negative GI ROS, Neg liver ROS,   Endo/Other  negative endocrine ROS  Renal/GU negative Renal ROS  negative genitourinary   Musculoskeletal   Abdominal   Peds  Hematology negative hematology ROS (+)   Anesthesia Other Findings Past Medical History: No date: Allergy No date: Anxiety No date: Arthritis No date: Depression No date: Hypertension   Reproductive/Obstetrics negative OB ROS                            Anesthesia Physical Anesthesia Plan  ASA: III  Anesthesia Plan: General ETT   Post-op Pain Management:    Induction:   PONV Risk Score and Plan: 4 or greater  Airway Management Planned:   Additional Equipment:   Intra-op Plan:   Post-operative Plan:   Informed Consent: I have reviewed the patients History and Physical, chart, labs and discussed the procedure including the risks, benefits and alternatives for the proposed anesthesia with the patient or authorized representative who has indicated his/her understanding and acceptance.     Dental Advisory Given  Plan Discussed with: CRNA  Anesthesia Plan Comments:         Anesthesia Quick Evaluation

## 2019-12-16 NOTE — Progress Notes (Signed)
Gave patient brochure regarding total hip replacement and site marked.  Plan on right total hip tomorrow in the morning at some point.  Risks and benefits discussed.

## 2019-12-17 ENCOUNTER — Inpatient Hospital Stay: Payer: PPO

## 2019-12-17 ENCOUNTER — Encounter
Admission: EM | Disposition: A | Discharge status: Skilled Nursing Facility | Payer: Self-pay | Source: Home / Self Care | Attending: Internal Medicine

## 2019-12-17 ENCOUNTER — Inpatient Hospital Stay: Payer: PPO | Admitting: Anesthesiology

## 2019-12-17 HISTORY — PX: TOTAL HIP ARTHROPLASTY: SHX124

## 2019-12-17 LAB — CBC
HCT: 38.4 % (ref 36.0–46.0)
Hemoglobin: 12.7 g/dL (ref 12.0–15.0)
MCH: 30.1 pg (ref 26.0–34.0)
MCHC: 33.1 g/dL (ref 30.0–36.0)
MCV: 91 fL (ref 80.0–100.0)
Platelets: 242 10*3/uL (ref 150–400)
RBC: 4.22 MIL/uL (ref 3.87–5.11)
RDW: 14.2 % (ref 11.5–15.5)
WBC: 10.5 10*3/uL (ref 4.0–10.5)
nRBC: 0 % (ref 0.0–0.2)

## 2019-12-17 LAB — BASIC METABOLIC PANEL
Anion gap: 8 (ref 5–15)
BUN: 25 mg/dL — ABNORMAL HIGH (ref 8–23)
CO2: 28 mmol/L (ref 22–32)
Calcium: 8.9 mg/dL (ref 8.9–10.3)
Chloride: 105 mmol/L (ref 98–111)
Creatinine, Ser: 0.93 mg/dL (ref 0.44–1.00)
GFR calc Af Amer: >60 mL/min (ref >60)
GFR calc non Af Amer: >60 mL/min (ref >60)
Glucose, Bld: 131 mg/dL — ABNORMAL HIGH (ref 70–99)
Potassium: 4.7 mmol/L (ref 3.5–5.1)
Sodium: 141 mmol/L (ref 135–145)

## 2019-12-17 SURGERY — ARTHROPLASTY, HIP, TOTAL, ANTERIOR APPROACH
Anesthesia: General | Site: Hip | Laterality: Right

## 2019-12-17 MED ORDER — PROPOFOL 10 MG/ML IV BOLUS
INTRAVENOUS | Status: DC | PRN
  Administered 2019-12-17 (×2): 40 mg via INTRAVENOUS

## 2019-12-17 MED ORDER — SODIUM CHLORIDE 0.9 % IV SOLN: INTRAVENOUS | Status: DC

## 2019-12-17 MED ORDER — BISACODYL 10 MG RE SUPP
10.0000 mg | Freq: Every day | RECTAL | Status: DC | PRN
  Administered 2019-12-22: 10 mg via RECTAL
  Filled 2019-12-17: qty 1

## 2019-12-17 MED ORDER — SODIUM CHLORIDE 0.9 % IV SOLN
INTRAVENOUS | Status: DC | PRN
  Administered 2019-12-17: 30 ug/min via INTRAVENOUS

## 2019-12-17 MED ORDER — SODIUM CHLORIDE 0.9 % IV SOLN
INTRAVENOUS | Status: DC | PRN
  Administered 2019-12-17: 60 mL

## 2019-12-17 MED ORDER — METOCLOPRAMIDE HCL 5 MG/ML IJ SOLN
5.0000 mg | Freq: Three times a day (TID) | INTRAMUSCULAR | Status: DC | PRN
  Administered 2019-12-18: 10 mg via INTRAVENOUS
  Filled 2019-12-17: qty 2

## 2019-12-17 MED ORDER — ONDANSETRON HCL 4 MG/2ML IJ SOLN
4.0000 mg | Freq: Four times a day (QID) | INTRAMUSCULAR | Status: DC | PRN
  Filled 2019-12-17: qty 2

## 2019-12-17 MED ORDER — SODIUM CHLORIDE 0.9 % IV SOLN: 12.5000 mg/h | Freq: Once | INTRAVENOUS | Status: DC

## 2019-12-17 MED ORDER — METOCLOPRAMIDE HCL 10 MG PO TABS: 5.0000 mg | ORAL_TABLET | Freq: Three times a day (TID) | ORAL | Status: DC | PRN

## 2019-12-17 MED ORDER — NEOMYCIN-POLYMYXIN B GU 40-200000 IR SOLN
Status: DC | PRN
  Administered 2019-12-17: 4 mL

## 2019-12-17 MED ORDER — MENTHOL 3 MG MT LOZG
1.0000 | LOZENGE | OROMUCOSAL | Status: DC | PRN
  Filled 2019-12-17: qty 9

## 2019-12-17 MED ORDER — DIPHENHYDRAMINE HCL 12.5 MG/5ML PO ELIX
12.5000 mg | ORAL_SOLUTION | ORAL | Status: DC | PRN
  Administered 2019-12-19: 12.5 mg via ORAL
  Administered 2019-12-19 – 2019-12-20 (×3): 25 mg via ORAL
  Administered 2019-12-21: 12.5 mg via ORAL
  Administered 2019-12-21: 25 mg via ORAL
  Administered 2019-12-21 (×2): 12.5 mg via ORAL
  Administered 2019-12-21: 25 mg via ORAL
  Filled 2019-12-17 (×2): qty 10
  Filled 2019-12-17: qty 5
  Filled 2019-12-17 (×6): qty 10

## 2019-12-17 MED ORDER — PROPOFOL 500 MG/50ML IV EMUL
INTRAVENOUS | Status: DC | PRN
  Administered 2019-12-17: 90 ug/kg/min via INTRAVENOUS

## 2019-12-17 MED ORDER — ONDANSETRON HCL 4 MG/2ML IJ SOLN: 4.0000 mg | Freq: Once | INTRAMUSCULAR | Status: DC | PRN

## 2019-12-17 MED ORDER — CEFAZOLIN SODIUM-DEXTROSE 2-4 GM/100ML-% IV SOLN
2.0000 g | Freq: Four times a day (QID) | INTRAVENOUS | Status: AC
  Administered 2019-12-17 – 2019-12-18 (×2): 2 g via INTRAVENOUS
  Filled 2019-12-17 (×2): qty 100

## 2019-12-17 MED ORDER — ONDANSETRON HCL 4 MG PO TABS
4.0000 mg | ORAL_TABLET | Freq: Four times a day (QID) | ORAL | Status: DC | PRN
  Administered 2019-12-22: 4 mg via ORAL
  Filled 2019-12-17: qty 1

## 2019-12-17 MED ORDER — MAGNESIUM CITRATE PO SOLN
1.0000 | Freq: Once | ORAL | Status: DC | PRN
  Filled 2019-12-17: qty 296

## 2019-12-17 MED ORDER — FENTANYL CITRATE (PF) 100 MCG/2ML IJ SOLN: 25.0000 ug | INTRAMUSCULAR | Status: DC | PRN

## 2019-12-17 MED ORDER — MIDAZOLAM HCL 2 MG/2ML IJ SOLN
INTRAMUSCULAR | Status: DC | PRN
  Administered 2019-12-17: 2 mg via INTRAVENOUS

## 2019-12-17 MED ORDER — MAGNESIUM HYDROXIDE 400 MG/5ML PO SUSP: 30.0000 mL | Freq: Every day | ORAL | Status: DC | PRN

## 2019-12-17 MED ORDER — MEPERIDINE HCL 50 MG/ML IJ SOLN
INTRAMUSCULAR | Status: AC
  Administered 2019-12-17: 12.5 mg
  Filled 2019-12-17: qty 1

## 2019-12-17 MED ORDER — ENOXAPARIN SODIUM 40 MG/0.4ML ~~LOC~~ SOLN
40.0000 mg | SUBCUTANEOUS | Status: DC
  Administered 2019-12-18 – 2019-12-22 (×5): 40 mg via SUBCUTANEOUS
  Filled 2019-12-17 (×5): qty 0.4

## 2019-12-17 MED ORDER — PHENOL 1.4 % MT LIQD
1.0000 | OROMUCOSAL | Status: DC | PRN
  Filled 2019-12-17: qty 177

## 2019-12-17 MED ORDER — BUPIVACAINE-EPINEPHRINE 0.25% -1:200000 IJ SOLN
INTRAMUSCULAR | Status: DC | PRN
  Administered 2019-12-17: 30 mL

## 2019-12-17 MED ORDER — ALUM & MAG HYDROXIDE-SIMETH 200-200-20 MG/5ML PO SUSP
30.0000 mL | ORAL | Status: DC | PRN
  Administered 2019-12-21: 30 mL via ORAL
  Filled 2019-12-17: qty 30

## 2019-12-17 MED ORDER — PHENYLEPHRINE HCL (PRESSORS) 10 MG/ML IV SOLN: INTRAVENOUS | Status: DC | PRN

## 2019-12-17 MED ORDER — EPHEDRINE SULFATE 50 MG/ML IJ SOLN
INTRAMUSCULAR | Status: DC | PRN
  Administered 2019-12-17: 10 mg via INTRAVENOUS
  Administered 2019-12-17 (×3): 5 mg via INTRAVENOUS

## 2019-12-17 MED ORDER — MEPERIDINE HCL 50 MG/5ML PO SOLN: 12.5000 mg | Freq: Once | ORAL | Status: DC

## 2019-12-17 MED ORDER — LACTATED RINGERS IV SOLN: INTRAVENOUS | Status: DC

## 2019-12-17 MED ORDER — DOCUSATE SODIUM 100 MG PO CAPS
100.0000 mg | ORAL_CAPSULE | Freq: Two times a day (BID) | ORAL | Status: DC
  Administered 2019-12-17 – 2019-12-23 (×12): 100 mg via ORAL
  Filled 2019-12-17 (×12): qty 1

## 2019-12-17 MED ORDER — BUPIVACAINE HCL (PF) 0.5 % IJ SOLN
INTRAMUSCULAR | Status: DC | PRN
  Administered 2019-12-17: 3 mL

## 2019-12-17 SURGICAL SUPPLY — 59 items
BLADE SAGITTAL AGGR TOOTH XLG (BLADE) ×3 IMPLANT
BNDG COHESIVE 6X5 TAN STRL LF (GAUZE/BANDAGES/DRESSINGS) ×9 IMPLANT
CANISTER SUCT 1200ML W/VALVE (MISCELLANEOUS) ×3 IMPLANT
CANISTER WOUND CARE 500ML ATS (WOUND CARE) ×3 IMPLANT
CHLORAPREP W/TINT 26 (MISCELLANEOUS) ×3 IMPLANT
COVER BACK TABLE REUSABLE LG (DRAPES) ×3 IMPLANT
COVER WAND RF STERILE (DRAPES) ×3 IMPLANT
DRAPE 3/4 80X56 (DRAPES) ×9 IMPLANT
DRAPE C-ARM XRAY 36X54 (DRAPES) ×3 IMPLANT
DRAPE INCISE IOBAN 66X60 STRL (DRAPES) IMPLANT
DRAPE POUCH INSTRU U-SHP 10X18 (DRAPES) ×3 IMPLANT
DRESSING SURGICEL FIBRLLR 1X2 (HEMOSTASIS) ×2 IMPLANT
DRSG OPSITE POSTOP 4X8 (GAUZE/BANDAGES/DRESSINGS) ×6 IMPLANT
DRSG SURGICEL FIBRILLAR 1X2 (HEMOSTASIS) ×6
ELECT BLADE 6.5 EXT (BLADE) ×3 IMPLANT
ELECT REM PT RETURN 9FT ADLT (ELECTROSURGICAL) ×3
ELECTRODE REM PT RTRN 9FT ADLT (ELECTROSURGICAL) ×1 IMPLANT
GLOVE BIOGEL PI IND STRL 9 (GLOVE) ×1 IMPLANT
GLOVE BIOGEL PI INDICATOR 9 (GLOVE) ×2
GLOVE SURG SYN 9.0  PF PI (GLOVE) ×4
GLOVE SURG SYN 9.0 PF PI (GLOVE) ×2 IMPLANT
GOWN SRG 2XL LVL 4 RGLN SLV (GOWNS) ×1 IMPLANT
GOWN STRL NON-REIN 2XL LVL4 (GOWNS) ×2
GOWN STRL REUS W/ TWL LRG LVL3 (GOWN DISPOSABLE) ×1 IMPLANT
GOWN STRL REUS W/TWL LRG LVL3 (GOWN DISPOSABLE) ×2
HEMOVAC 400CC 10FR (MISCELLANEOUS) IMPLANT
HIP FEM HD M 28 (Head) ×3 IMPLANT
HOLDER FOLEY CATH W/STRAP (MISCELLANEOUS) ×3 IMPLANT
HOOD PEEL AWAY FLYTE STAYCOOL (MISCELLANEOUS) ×3 IMPLANT
KIT PREVENA INCISION MGT 13 (CANNISTER) ×3 IMPLANT
LINER DUAL MOB 50MM (Liner) ×3 IMPLANT
MASTERLOC HIP LATERAL S7 (Hips) ×3 IMPLANT
MAT ABSORB  FLUID 56X50 GRAY (MISCELLANEOUS) ×2
MAT ABSORB FLUID 56X50 GRAY (MISCELLANEOUS) ×1 IMPLANT
NDL SAFETY ECLIPSE 18X1.5 (NEEDLE) ×1 IMPLANT
NEEDLE HYPO 18GX1.5 SHARP (NEEDLE) ×2
NEEDLE SPNL 20GX3.5 QUINCKE YW (NEEDLE) ×6 IMPLANT
NS IRRIG 1000ML POUR BTL (IV SOLUTION) ×3 IMPLANT
PACK HIP COMPR (MISCELLANEOUS) ×3 IMPLANT
SCALPEL PROTECTED #10 DISP (BLADE) ×6 IMPLANT
SHELL ACETABULAR SZ0 50 DME (Shell) ×3 IMPLANT
SOL PREP PVP 2OZ (MISCELLANEOUS) ×3
SOLUTION PREP PVP 2OZ (MISCELLANEOUS) ×1 IMPLANT
SPONGE DRAIN TRACH 4X4 STRL 2S (GAUZE/BANDAGES/DRESSINGS) ×3 IMPLANT
STAPLER SKIN PROX 35W (STAPLE) ×3 IMPLANT
STRAP SAFETY 5IN WIDE (MISCELLANEOUS) ×3 IMPLANT
SUT DVC 2 QUILL PDO  T11 36X36 (SUTURE) ×2
SUT DVC 2 QUILL PDO T11 36X36 (SUTURE) ×1 IMPLANT
SUT SILK 0 (SUTURE) ×2
SUT SILK 0 30XBRD TIE 6 (SUTURE) ×1 IMPLANT
SUT V-LOC 90 ABS DVC 3-0 CL (SUTURE) ×3 IMPLANT
SUT VIC AB 1 CT1 36 (SUTURE) ×3 IMPLANT
SYR 20ML LL LF (SYRINGE) ×3 IMPLANT
SYR 30ML LL (SYRINGE) ×3 IMPLANT
SYR 50ML LL SCALE MARK (SYRINGE) ×6 IMPLANT
SYR BULB IRRIG 60ML STRL (SYRINGE) ×3 IMPLANT
TAPE MICROFOAM 4IN (TAPE) ×3 IMPLANT
TOWEL OR 17X26 4PK STRL BLUE (TOWEL DISPOSABLE) ×3 IMPLANT
TRAY FOLEY MTR SLVR 16FR STAT (SET/KITS/TRAYS/PACK) ×3 IMPLANT

## 2019-12-17 NOTE — Transfer of Care (Signed)
Immediate Anesthesia Transfer of Care Note  Patient: Linda Farley  Procedure(s) Performed: TOTAL HIP ARTHROPLASTY ANTERIOR APPROACH (Right Hip)  Patient Location: PACU  Anesthesia Type:Spinal  Level of Consciousness: awake, alert  and oriented  Airway & Oxygen Therapy: Patient Spontanous Breathing and Patient connected to face mask oxygen  Post-op Assessment: Report given to RN and Post -op Vital signs reviewed and stable  Post vital signs: Reviewed and stable  Last Vitals:  Vitals Value Taken Time  BP    Temp    Pulse 89 12/17/19 1458  Resp    SpO2 95 % 12/17/19 1458  Vitals shown include unvalidated device data.  Last Pain:  Vitals:   12/17/19 1303  TempSrc: Oral  PainSc:       Patients Stated Pain Goal: 2 (40/98/11 9147)  Complications: No complications documented.

## 2019-12-17 NOTE — Anesthesia Procedure Notes (Signed)
Spinal  Patient location during procedure: OR Start time: 12/17/2019 1:25 PM End time: 12/17/2019 1:27 PM Staffing Performed: other anesthesia staff  Anesthesiologist: Molli Barrows, MD Resident/CRNA: Nelda Marseille, CRNA Other anesthesia staff: Norm Salt, RN Preanesthetic Checklist Completed: patient identified, IV checked, site marked, risks and benefits discussed, surgical consent, monitors and equipment checked and pre-op evaluation Spinal Block Patient position: sitting Prep: Betadine Patient monitoring: heart rate, continuous pulse ox and blood pressure Approach: midline Location: L4-5 Injection technique: single-shot Needle Needle type: Whitacre and Introducer  Needle gauge: 24 G Needle length: 9 cm Additional Notes Negative paresthesia. Negative blood return. Positive free-flowing CSF. Expiration date of kit checked and confirmed. Patient tolerated procedure well, without complications.

## 2019-12-17 NOTE — Op Note (Signed)
12/17/2019  2:59 PM  PATIENT:  Linda Farley  69 y.o. female  PRE-OPERATIVE DIAGNOSIS:  Right Hip Osteoarthritis  POST-OPERATIVE DIAGNOSIS:  Right Hip Osteoarthritis and fracture of femoral head  PROCEDURE:  Procedure(s): TOTAL HIP ARTHROPLASTY ANTERIOR APPROACH (Right)  SURGEON: Laurene Footman, MD  ASSISTANTS: None  ANESTHESIA:   spinal  EBL:  Total I/O In: 600 [I.V.:500; IV Piggyback:100] Out: 800 [Urine:800]  BLOOD ADMINISTERED:none  DRAINS: Incisional wound VAC   LOCAL MEDICATIONS USED:  MARCAINE    and OTHER Exparel  SPECIMEN:  Source of Specimen:  Femoral head right  DISPOSITION OF SPECIMEN:  PATHOLOGY  COUNTS:  YES  TOURNIQUET:  * No tourniquets in log *  IMPLANTS: Medacta Masterloc 7 lateralized with 50 mm Mpact TM cup and liner with M 28 mm metal head  DICTATION: .Dragon Dictation   The patient was brought to the operating room and after spinal anesthesia was obtained patient was placed on the operative table with the ipsilateral foot into the Medacta attachment, contralateral leg on a well-padded table. C-arm was brought in and preop template x-ray taken. After prepping and draping in usual sterile fashion appropriate patient identification and timeout procedures were completed. Anterior approach to the hip was obtained and centered over the greater trochanter and TFL muscle. The subcutaneous tissue was incised hemostasis being achieved by electrocautery. TFL fascia was incised and the muscle retracted laterally deep retractor placed. The lateral femoral circumflex vessels were identified and ligated. The anterior capsule was exposed and a capsulotomy performed. The neck was identified and a femoral neck cut carried out with a saw. The head was removed without difficulty and showed sclerotic femoral head and acetabulum.  That appeared to have a crack through the central portion that was not noted on prior CT or x-ray.  Reaming was carried out to 50 mm and a 50 mm cup  trial gave appropriate tightness to the acetabular component a 50 DM cup was impacted into position. The leg was then externally rotated and ischiofemoral and pubofemoral releases carried out. The femur was sequentially broached to a size 7, size 7 lateralized with S head trials were placed and the final components chosen. The 7 Masterloc lateralized stem was inserted along with a metal M 28 mm head and 50 mm liner. The hip was reduced and was stable the wound was thoroughly irrigated with fibrillar placed along the posterior capsule and medial neck. The deep fascia ws closed using a heavy Quill after infiltration of 30 cc of quarter percent Sensorcaine mixed with Exparel with epinephrine.3-0 V-loc to close the skin with skin staples.  Incisional wound VAC applied and patient was sent to recovery in stable condition.   PLAN OF CARE: Continue as inpatient

## 2019-12-17 NOTE — Progress Notes (Signed)
[facility] PROGRESS NOTE   Linda Farley RNH:[id number] DOB: [birth year] DOA: 12/14/2019  PCP: [name], FNP  Brief History/Interval Summary: 69 y.o. Caucasian female with a known history of hypertension, depression, spinal stenosis with chronic low back pain, who presented to the emergency room with acute onset of worsening low back pain which has been intractable as well as right hip pain with radiation to her right leg with associated right leg weakness and burning without tingling or numbness.  She has an appointment for discussion about right hip arthroplasty with Dr. [name] in [place] clinic coming soon.  Patient was unable to weight-bear and ambulate.  She was hospitalized.  Neurosurgery as well as orthopedic surgery was consulted.  Reason for Visit: Significant pain in the right hip with inability to bear weight.  Consultants: Neurosurgery.  Orthopedics.  Procedures: None yet  Antibiotics: Anti-infectives (From admission, onward)   Start     Dose/Rate Route Frequency Ordered Stop   12/17/19 1200  ceFAZolin (ANCEF) IVPB 2g/100 mL premix     Discontinue     2 g 200 mL/hr over 30 Minutes Intravenous  Once 12/16/19 0745        Subjective/Interval History: Patient states that she is feeling well.  Did have some pain issues overnight but not severe.  She is a little anxious about her upcoming surgery.  Denies any shortness of breath or chest pain.      Assessment/Plan:  Intractable right hip pain with inability to bear weight and ambulate Patient known to have arthritis.  She was supposed to see orthopedics in the outpatient setting to discuss hip joint replacement.  She is unable to bear weight on the right side.  Examination does not suggest any fractures.  She did have a CT scan which does not show any fractures in that area.  Patient seen by orthopedics.  Plan is for total hip replacement this morning.  Labs are stable.  EKG done at the time of admission shows  right bundle branch block.  No old EKGs available for comparison.  Patient does not have any angina with usual activities.  No murmurs appreciated on examination.  Patient may proceed to surgery without any cardiac testing.  Chronic low back pain with radiculopathy Seen by neurosurgery.  MRI of the lumbar spine was done which does not show any acute findings.  She was started on intravenous dexamethasone.  Her back pain appears to be stable.  No plans for any surgical intervention currently.  Follow-up with her physiatrist.  Start tapering her steroids postoperatively.    Essential hypertension Blood pressure is reasonably well controlled.  On lisinopril/HCTZ at home.  Continuing just lisinopril here in the hospital.  Blood pressure is reasonably well controlled.  Peripheral neuropathy Continue with gabapentin.  History of depression Continue with citalopram   DVT Prophylaxis: Lovenox Code Status: Full code Family Communication: Discussed with the patient Disposition Plan:  Status is: Inpatient  Remains inpatient appropriate because:Ongoing active pain requiring inpatient pain management   Dispo:  Patient From: Home  Planned Disposition: To be determined  Expected discharge date: 12/19/19  Medically stable for discharge: No       Medications:  Scheduled: . citalopram  20 mg Oral Daily  . dexamethasone (DECADRON) injection  4 mg Intravenous Q12H  . enoxaparin (LOVENOX) injection  40 mg Subcutaneous Q24H  . gabapentin  300 mg Oral Daily  . gabapentin  600 mg Oral QHS  . lisinopril  20 mg Oral Daily  .  magnesium oxide  400 mg Oral Daily  . meloxicam  15 mg Oral Daily  . polyethylene glycol  17 g Oral Daily   Continuous: .  ceFAZolin (ANCEF) IV     HWE:XHBZJIRCVELFY **OR** acetaminophen, albuterol, HYDROcodone-acetaminophen, ketorolac, magnesium hydroxide, morphine injection, ondansetron **OR** ondansetron (ZOFRAN) IV, sodium phosphate, traMADol,  traZODone   Objective:  Vital Signs  Vitals:   12/16/19 0810 12/16/19 1437 12/16/19 2357 12/17/19 0746  BP: (!) 141/69 116/63 133/86 124/73  Pulse: 61 81 76 (!) 56  Resp: 18  17 16   Temp: 98.4 F (36.9 C) 98.1 F (36.7 C) (!) 97.5 F (36.4 C) 98.2 F (36.8 C)  TempSrc: Oral Oral Oral Oral  SpO2: 97% 97% 96% 96%  Weight:      Height:        Intake/Output Summary (Last 24 hours) at 12/17/2019 1037 Last data filed at 12/17/2019 0915 Gross per 24 hour  Intake 120 ml  Output 1600 ml  Net -1480 ml   Filed Weights   12/14/19 1710  Weight: 81.6 kg   General appearance: Awake alert.  In no distress Resp: Clear to auscultation bilaterally.  Normal effort Cardio: S1-S2 is normal regular.  No S3-S4.  No rubs murmurs or bruit GI: Abdomen is soft.  Nontender nondistended.  Bowel sounds are present normal.  No masses organomegaly Extremities: No edema.  Restricted range of motion of the right leg Neurologic: Alert and oriented x3.  No focal neurological deficits.      Lab Results:  Data Reviewed: I have personally reviewed following labs and imaging studies  CBC: Recent Labs  Lab 12/14/19 1711 12/15/19 0507 12/17/19 0626  WBC 10.0 9.2 10.5  HGB 13.7 12.5 12.7  HCT 41.7 37.5 38.4  MCV 92.1 91.0 91.0  PLT 224 218 101    Basic Metabolic Panel: Recent Labs  Lab 12/14/19 1711 12/15/19 0507 12/17/19 0626  NA 140 141 141  K 3.9 4.3 4.7  CL 102 103 105  CO2 27 28 28   GLUCOSE 95 116* 131*  BUN 29* 28* 25*  CREATININE 1.08* 0.93 0.93  CALCIUM 9.2 9.0 8.9    GFR: Estimated Creatinine Clearance: 60.2 mL/min (by C-G formula based on SCr of 0.93 mg/dL).  Liver Function Tests: Recent Labs  Lab 12/14/19 1711  AST 17  ALT 12  ALKPHOS 87  BILITOT 0.9  PROT 6.8  ALBUMIN 3.9     Recent Results (from the past 240 hour(s))  SARS Coronavirus 2 by RT PCR (hospital order, performed in [facility] hospital lab) Nasopharyngeal Nasopharyngeal Swab     Status: None    Collection Time: 12/15/19 12:35 AM   Specimen: Nasopharyngeal Swab  Result Value Ref Range Status   SARS Coronavirus 2 NEGATIVE NEGATIVE Final    Comment: (NOTE) SARS-CoV-2 target nucleic acids are NOT DETECTED.  The SARS-CoV-2 RNA is generally detectable in upper and lower respiratory specimens during the acute phase of infection. The lowest concentration of SARS-CoV-2 viral copies this assay can detect is 250 copies / mL. A negative result does not preclude SARS-CoV-2 infection and should not be used as the sole basis for treatment or other patient management decisions.  A negative result may occur with improper specimen collection / handling, submission of specimen other than nasopharyngeal swab, presence of viral mutation(s) within the areas targeted by this assay, and inadequate number of viral copies (<250 copies / mL). A negative result must be combined with clinical observations, patient history, and epidemiological information.  Fact  Sheet for Patients:   StrictlyIdeas.no  Fact Sheet for Healthcare Providers: BankingDealers.co.za  This test is not yet approved or  cleared by the [place] FDA and has been authorized for detection and/or diagnosis of SARS-CoV-2 by FDA under an Emergency Use Authorization (EUA).  This EUA will remain in effect (meaning this test can be used) for the duration of the COVID-19 declaration under Section 564(b)(1) of the Act, 21 U.S.C. section 360bbb-3(b)(1), unless the authorization is terminated or revoked sooner.  Performed at [facility], [address], [place], [place] [address]   Surgical pcr screen     Status: None   Collection Time: 12/16/19  3:57 PM   Specimen: Nasal Mucosa; Nasal Swab  Result Value Ref Range Status   MRSA, PCR NEGATIVE NEGATIVE Final   Staphylococcus aureus NEGATIVE NEGATIVE Final    Comment: (NOTE) The Xpert SA Assay (FDA approved for NASAL specimens  in patients 42 years of age and older), is one component of a comprehensive surveillance program. It is not intended to diagnose infection nor to guide or monitor treatment. Performed at [facility], [address], [place], [place] [address]       Radiology Studies: No results found.     LOS: 2 days   [facility] Pager on [email or web address]  12/17/2019, 10:37 AM

## 2019-12-18 ENCOUNTER — Inpatient Hospital Stay (HOSPITAL_COMMUNITY)
Admit: 2019-12-18 | Discharge: 2019-12-18 | Disposition: A | Discharge status: Home / Self Care | Payer: PPO | Attending: Neurology | Admitting: Neurology

## 2019-12-18 ENCOUNTER — Inpatient Hospital Stay: Payer: PPO

## 2019-12-18 ENCOUNTER — Encounter: Payer: Self-pay | Admitting: Orthopedic Surgery

## 2019-12-18 DIAGNOSIS — I639 Cerebral infarction, unspecified: Secondary | ICD-10-CM

## 2019-12-18 DIAGNOSIS — I340 Nonrheumatic mitral (valve) insufficiency: Secondary | ICD-10-CM

## 2019-12-18 DIAGNOSIS — R000 Tachycardia, unspecified: Secondary | ICD-10-CM

## 2019-12-18 DIAGNOSIS — I361 Nonrheumatic tricuspid (valve) insufficiency: Secondary | ICD-10-CM

## 2019-12-18 LAB — CBC
HCT: 32.3 % — ABNORMAL LOW (ref 36.0–46.0)
Hemoglobin: 10.7 g/dL — ABNORMAL LOW (ref 12.0–15.0)
MCH: 30.5 pg (ref 26.0–34.0)
MCHC: 33.1 g/dL (ref 30.0–36.0)
MCV: 92 fL (ref 80.0–100.0)
Platelets: 182 10*3/uL (ref 150–400)
RBC: 3.51 MIL/uL — ABNORMAL LOW (ref 3.87–5.11)
RDW: 14.5 % (ref 11.5–15.5)
WBC: 9.8 10*3/uL (ref 4.0–10.5)
nRBC: 0 % (ref 0.0–0.2)

## 2019-12-18 LAB — BASIC METABOLIC PANEL
Anion gap: 7 (ref 5–15)
BUN: 26 mg/dL — ABNORMAL HIGH (ref 8–23)
CO2: 28 mmol/L (ref 22–32)
Calcium: 7.9 mg/dL — ABNORMAL LOW (ref 8.9–10.3)
Chloride: 106 mmol/L (ref 98–111)
Creatinine, Ser: 1.02 mg/dL — ABNORMAL HIGH (ref 0.44–1.00)
GFR calc Af Amer: >60 mL/min (ref >60)
GFR calc non Af Amer: 56 mL/min — ABNORMAL LOW (ref >60)
Glucose, Bld: 96 mg/dL (ref 70–99)
Potassium: 3.7 mmol/L (ref 3.5–5.1)
Sodium: 141 mmol/L (ref 135–145)

## 2019-12-18 MED ORDER — ASPIRIN 300 MG RE SUPP
300.0000 mg | Freq: Every day | RECTAL | Status: DC
  Filled 2019-12-18 (×6): qty 1

## 2019-12-18 MED ORDER — ASPIRIN 325 MG PO TABS
325.0000 mg | ORAL_TABLET | Freq: Every day | ORAL | Status: DC
  Administered 2019-12-18 – 2019-12-23 (×6): 325 mg via ORAL
  Filled 2019-12-18 (×7): qty 1

## 2019-12-18 MED ORDER — STROKE: EARLY STAGES OF RECOVERY BOOK: Freq: Once | Status: AC

## 2019-12-18 MED ORDER — IOHEXOL 350 MG/ML SOLN
75.0000 mL | Freq: Once | INTRAVENOUS | Status: AC | PRN
  Administered 2019-12-18: 75 mL via INTRAVENOUS

## 2019-12-18 MED ORDER — PNEUMOCOCCAL VAC POLYVALENT 25 MCG/0.5ML IJ INJ
0.5000 mL | INJECTION | INTRAMUSCULAR | Status: AC
  Administered 2019-12-19: 0.5 mL via INTRAMUSCULAR
  Filled 2019-12-18: qty 0.5

## 2019-12-18 NOTE — Progress Notes (Signed)
*  PRELIMINARY RESULTS* Echocardiogram 2D Echocardiogram has been performed.  Sherrie Sport 12/18/2019, 6:02 PM

## 2019-12-18 NOTE — Care Management Important Message (Signed)
Important Message  Patient Details  Name: Linda Farley MRN: [id number] Date of Birth: [birth year]   Medicare Important Message Given:  Yes     [name] 12/18/2019, 11:08 AM

## 2019-12-18 NOTE — Progress Notes (Signed)
  Subjective: 1 Day Post-Op Procedure(s) (LRB): TOTAL HIP ARTHROPLASTY ANTERIOR APPROACH (Right) Patient reports pain as moderate.   Patient is well but recently developed double vision, CT of brain performed and MRI of the brain pending PT and care management to assist with discharge planning. Negative for chest pain and shortness of breath Fever: no Gastrointestinal:Negative for nausea and vomiting  Objective: Vital signs in last 24 hours: Temp:  [96.6 F (35.9 C)-99.3 F (37.4 C)] 99.3 F (37.4 C) (07/09 0801) Pulse Rate:  [70-112] 77 (07/09 0801) Resp:  [11-20] 16 (07/09 0801) BP: (106-151)/(50-114) 121/67 (07/09 0801) SpO2:  [93 %-100 %] 95 % (07/09 0801)  Intake/Output from previous day:  Intake/Output Summary (Last 24 hours) at 12/18/2019 1146 Last data filed at 12/18/2019 4081 Gross per 24 hour  Intake 3161.94 ml  Output 1250 ml  Net 1911.94 ml    Intake/Output this shift: No intake/output data recorded.  Labs: Recent Labs    12/17/19 0626 12/18/19 0429  HGB 12.7 10.7*   Recent Labs    12/17/19 0626 12/18/19 0429  WBC 10.5 9.8  RBC 4.22 3.51*  HCT 38.4 32.3*  PLT 242 182   Recent Labs    12/17/19 0626 12/18/19 0429  NA 141 141  K 4.7 3.7  CL 105 106  CO2 28 28  BUN 25* 26*  CREATININE 0.93 1.02*  GLUCOSE 131* 96  CALCIUM 8.9 7.9*   No results for input(s): LABPT, INR in the last 72 hours.   EXAM General - Patient is Alert, Appropriate and Oriented Extremity - ABD soft Neurovascular intact Sensation intact distally Intact pulses distally Dorsiflexion/Plantar flexion intact No cellulitis present Dressing/Incision - No drainage noted to the prevena woundvac. Motor Function - intact, moving foot and toes well on exam.  Negative homans to the right leg.  Past Medical History:  Diagnosis Date  . Allergy   . Anxiety   . Arthritis   . Depression   . Hypertension     Assessment/Plan: 1 Day Post-Op Procedure(s) (LRB): TOTAL HIP  ARTHROPLASTY ANTERIOR APPROACH (Right) Active Problems:   Intractable low back pain  Estimated body mass index is 29.95 kg/m as calculated from the following:   Height as of this encounter: 5\' 5"  (1.651 m).   Weight as of this encounter: 81.6 kg. Advance diet Up with therapy D/C IV fluids when tolerating po intake.  Labs reviewed. Up with therapy today. Begin working on BM.  DVT Prophylaxis - Lovenox, Foot Pumps and TED hose Weight-Bearing as tolerated to right leg  J. Cameron Proud, PA-C [facility] Orthopaedic Surgery 12/18/2019, 11:46 AM

## 2019-12-18 NOTE — Evaluation (Signed)
Physical Therapy Re-Evaluation Patient Details Name: Linda Farley MRN: [id number] DOB: 1950/11/18 Today's Date: 12/18/2019   History of Present Illness  Patient is a 69 year old female presenting to the ED with acute onset worsening low back pain as well as right hip pain with radiation to right leg with associated right leg weakness/burning without tingling or numbness.  Neurosurgery consulted and recommending conservative management for lumbar degenerative disease, spinal stenosis.  Pt diagnosed with right femoral head fracture and is s/p R THA.  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  MD assessment also includes palpitations, and chronic low back pain with radiculopathy. PMH includes: peripheral neuropathy, depression, and HTN.    Clinical Impression  Pt pleasant and motivated to participate during the session.  Pt's BUE and BLE strength, sensation, and coordination all assessed to pt's tolerance per below with no neurological sequela observed other than the pt's report of double vision. Functional and RLE weakness observed but consistent with post-op THA expectations especially when considering the pt's relatively low baseline functional level.  Vision NT secondary to pt's report of nausea associated with visual assessment.  Pt required physical assistance with all functional tasks and was unable to ambulate.  The pt was able to lift her surgical foot from the floor 2-3 time with great effort and minimal clearance but was unable to lift her L foot from the floor secondary to R hip pain with weight bearing.  Pt will benefit from PT services in a SNF setting upon discharge to safely address deficits listed in patient problem list for decreased caregiver assistance and eventual return to PLOF.      Follow Up Recommendations SNF    Equipment Recommendations  Rolling walker with 5" wheels    Recommendations for Other Services        Precautions / Restrictions Precautions Precautions: Fall;Anterior Hip Precaution Booklet Issued: Yes (comment) Restrictions Weight Bearing Restrictions: Yes RLE Weight Bearing: Weight bearing as tolerated      Mobility  Bed Mobility Overal bed mobility: Needs Assistance Bed Mobility: Supine to Sit;Sit to Supine     Supine to sit: Mod assist Sit to supine: Mod assist;Max assist   General bed mobility comments: Mod-max A for RLE and trunk control  Transfers Overall transfer level: Needs assistance Equipment used: Rolling walker (2 wheeled) Transfers: Sit to/from Stand Sit to Stand: Min guard;From elevated surface         General transfer comment: Mod verbal cues for foot and hand placement  Ambulation/Gait             General Gait Details: Unable to advance the LLE  Stairs            Wheelchair Mobility    Modified Rankin (Stroke Patients Only)       Balance Overall balance assessment: Needs assistance;History of Falls Sitting-balance support: Feet unsupported;No upper extremity supported Sitting balance-Leahy Scale: Good     Standing balance support: Bilateral upper extremity supported Standing balance-Leahy Scale: Fair Standing balance comment: Mod lean on the RW for support                             Pertinent Vitals/Pain Pain Assessment: 0-10 Pain Score: 7  Pain Location: right hip  Pain Descriptors / Indicators: Sore;Aching Pain Intervention(s): Premedicated before session;Monitored during session    Home Living Family/patient expects to be discharged to:: Private  residence Living Arrangements: Other relatives Available Help at Discharge: Family;Available 24 hours/day Type of Home: House Home Access: Ramped entrance     Home Layout: One level Home Equipment: Grab bars - toilet;Wheelchair - [employer] - 4 wheels;Cane - single point Additional Comments: Has bilateral grab bars with toilet; pt to live with sister once  discharged home for increased caregiver assistance    Prior Function Level of Independence: Independent with assistive device(s)   Gait / Transfers Assistance Needed: Patient reports 5-6 falls at home in the last 6 months secondary to "orthostatic hypotension" per sister, tripping, and/or LOB. patient states she ambulates very short distances using 2 single point canes but has been primarily in the bed recently due to hip and back pain (using "pee cup" in bed)  ADL's / Homemaking Assistance Needed: reports sponge bathing independently recently due to hip and back pain.        Hand Dominance   Dominant Hand: Right    Extremity/Trunk Assessment   Upper Extremity Assessment Upper Extremity Assessment: RUE deficits/detail;LUE deficits/detail RUE Deficits / Details: RUE strength grossly WFL except for shoulder strength which was not tested secondary to pain and a h/o "frozen shoulders" RUE Sensation: WNL RUE Coordination: WNL LUE Deficits / Details: LUE strength grossly WFL except for shoulder strength which was not tested secondary to pain and a h/o "frozen shoulders" LUE Sensation: WNL LUE Coordination: WNL    Lower Extremity Assessment Lower Extremity Assessment: RLE deficits/detail;LLE deficits/detail RLE Deficits / Details: RLE ankle strength and AROM grossly WNL and equal left/right; RLE knee flex and ext 3+/5 and equal left/right RLE: Unable to fully assess due to pain RLE Sensation: WNL LLE Deficits / Details: LLE ankle strength and AROM grossly WNL and equal left/right; LLE knee flex and ext 3+/5 and equal left/right LLE Sensation: WNL       Communication   Communication: No difficulties  Cognition Arousal/Alertness: Awake/alert Behavior During Therapy: WFL for tasks assessed/performed Overall Cognitive Status: Within Functional Limits for tasks assessed                                        General Comments      Exercises Total Joint  Exercises Ankle Circles/Pumps: AROM;Strengthening;Both;10 reps Quad Sets: Strengthening;Both;10 reps Gluteal Sets: Strengthening;Both;10 reps Hip ABduction/ADduction: AAROM;Both;5 reps Straight Leg Raises: AAROM;Both;5 reps Long Arc Quad: AROM;Strengthening;Both;10 reps Marching in Standing: AROM;Right;Other reps (comment);Standing (2-3 reps on the RLE with low amplitude) Other Exercises Other Exercises: Anterior hip precaution education with pt and sister   Assessment/Plan    PT Assessment Patient needs continued PT services  PT Problem List Decreased strength;Decreased activity tolerance;Decreased balance;Decreased mobility;Decreased safety awareness;Pain;Decreased knowledge of use of DME       PT Treatment Interventions DME instruction;Gait training;Stair training;Functional mobility training;Therapeutic activities;Therapeutic exercise;Balance training;Patient/family education    PT Goals (Current goals can be found in the Care Plan section)  Acute Rehab PT Goals Patient Stated Goal: To walk better PT Goal Formulation: With patient Time For Goal Achievement: 12/31/19 Potential to Achieve Goals: Fair    Frequency BID   Barriers to discharge Inaccessible home environment      Co-evaluation               AM-PAC PT "6 Clicks" Mobility  Outcome Measure Help needed turning from your back to your side while in a flat bed without using bedrails?: A Lot Help needed moving  from lying on your back to sitting on the side of a flat bed without using bedrails?: A Lot Help needed moving to and from a bed to a chair (including a wheelchair)?: A Lot Help needed standing up from a chair using your arms (e.g., wheelchair or bedside chair)?: A Little Help needed to walk in hospital room?: Total Help needed climbing 3-5 steps with a railing? : Total 6 Click Score: 11    End of Session Equipment Utilized During Treatment: Gait belt Activity Tolerance: Patient limited by  pain Patient left: in bed;with call bell/phone within reach;with family/visitor present;with SCD's reapplied;Other (comment) (Pt left with Imaging tech for procedure) Nurse Communication: Mobility status;Weight bearing status PT Visit Diagnosis: Muscle weakness (generalized) (M62.81);Other abnormalities of gait and mobility (R26.89);Pain;History of falling (Z91.81);Other symptoms and signs involving the nervous system (R29.898) Pain - Right/Left: Right Pain - part of body: Hip    Time: 3474-2595 PT Time Calculation (min) (ACUTE ONLY): 35 min   Charges:   PT Evaluation $PT Re-evaluation: 1 Re-eval PT Treatments $Therapeutic Exercise: 8-22 mins       D. Royetta Asal PT, DPT 12/18/19, 5:31 PM

## 2019-12-18 NOTE — Consult Note (Signed)
Requesting Physician: Maryland Pink    Chief Complaint: Diplopia  I have been asked by Dr. Maryland Pink to see this patient in consultation for diplopia.  HPI: Linda Farley is an 69 y.o. female with history of HTN and arthritis who underwent right total hip arthroplasty for right femoral head fracture on 7/8.  Overnight began to experience what she described as diplopia.  Patient s/p surgical procedure therefore was not a tPA candidate.  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  Consult called for further recommendations.     Date last known well: 12/18/2019 Time last known well: Time: 02:30 tPA Given: No: Recent surgery  Past Medical History:  Diagnosis Date  . Allergy   . Anxiety   . Arthritis   . Depression   . Hypertension     Past Surgical History:  Procedure Laterality Date  . ABDOMINAL HYSTERECTOMY    . APPENDECTOMY    . COLONOSCOPY WITH PROPOFOL N/A 09/24/2019   Procedure: COLONOSCOPY WITH PROPOFOL;  Surgeon: Lucilla Lame, MD;  Location: [facility] ENDOSCOPY;  Service: Endoscopy;  Laterality: N/A;  Patient states given Versed for last procedure and woke up during. She is nervous about this happening again.  . TONSILLECTOMY    . TOTAL HIP ARTHROPLASTY Right 12/17/2019   Procedure: TOTAL HIP ARTHROPLASTY ANTERIOR APPROACH;  Surgeon: Hessie Knows, MD;  Location: [facility] ORS;  Service: Orthopedics;  Laterality: Right;    Family History  Problem Relation Age of Onset  . Prostate cancer Father   . Bone cancer Brother   . Heart disease Brother    Social History:  reports that she has never smoked. She has never used smokeless tobacco. She reports current alcohol use of about 3.0 standard drinks of alcohol per week. She reports that she does not use drugs.  Allergies:  Allergies  Allergen Reactions  . Chlorhexidine   . Penicillins Rash    Medications:  I have reviewed the patient's current medications. Scheduled: .  stroke: mapping our early stages of  recovery book   Does not apply Once  . aspirin  300 mg Rectal Daily   Or  . aspirin  325 mg Oral Daily  . citalopram  20 mg Oral Daily  . docusate sodium  100 mg Oral BID  . enoxaparin (LOVENOX) injection  40 mg Subcutaneous Q24H  . gabapentin  300 mg Oral Daily  . gabapentin  600 mg Oral QHS  . lisinopril  20 mg Oral Daily  . magnesium oxide  400 mg Oral Daily  . polyethylene glycol  17 g Oral Daily    ROS: History obtained from the patient  General ROS: negative for - chills, fatigue, fever, night sweats, weight gain or weight loss Psychological ROS: negative for - behavioral disorder, hallucinations, memory difficulties, mood swings or suicidal ideation Ophthalmic ROS: visual changes ENT ROS: negative for - epistaxis, nasal discharge, oral lesions, sore throat, tinnitus or vertigo Allergy and Immunology ROS: negative for - hives or itchy/watery eyes Hematological and Lymphatic ROS: negative for - bleeding problems, bruising or swollen lymph nodes Endocrine ROS: negative for - galactorrhea, hair pattern changes, polydipsia/polyuria or temperature intolerance Respiratory ROS: negative for - cough, hemoptysis, shortness of breath or wheezing Cardiovascular ROS: negative for - chest pain, dyspnea on exertion, edema or irregular heartbeat Gastrointestinal ROS: negative for - abdominal pain, diarrhea, hematemesis, nausea/vomiting or stool incontinence Genito-Urinary ROS: negative for - dysuria, hematuria, incontinence or urinary frequency/urgency Musculoskeletal ROS: left arm numbness Neurological ROS:  as noted in HPI Dermatological ROS: negative for rash and skin lesion changes  Physical Examination: Blood pressure 127/67, pulse 97, temperature 98.9 F (37.2 C), temperature source Oral, resp. rate 17, height 5\' 5"  (1.651 m), weight 81.6 kg, SpO2 96 %.  HEENT-  Normocephalic, no lesions, without obvious abnormality.  Normal external eye and conjunctiva.  Normal TM's bilaterally.   Normal auditory canals and external ears. Normal external nose, mucus membranes and septum.  Normal pharynx. Cardiovascular- S1, S2 normal, pulses palpable throughout   Lungs- chest clear, no wheezing, rales, normal symmetric air entry Abdomen- soft, non-tender; bowel sounds normal; no masses,  no organomegaly Extremities- right hip surgery Lymph-no adenopathy palpable Musculoskeletal-right hip surgery  Neurological Examination   Mental Status: Alert, oriented, thought content appropriate.  Speech fluent without evidence of aphasia.  Able to follow 3 step commands without difficulty. Cranial Nerves: II: LHH, pupils equal, round, reactive to light and accommodation III,IV, VI: ptosis not present, extra-ocular motions intact bilaterally V,VII: smile symmetric, facial light touch sensation normal bilaterally VIII: hearing normal bilaterally IX,X: gag reflex present XI: bilateral shoulder shrug XII: midline tongue extension Motor: Right : Upper extremity   5/5    Left:     Upper extremity   5/5  Lower extremity   Not tested    Lower extremity   5/5 Tone and bulk:normal tone throughout; no atrophy noted Sensory: Pinprick and light touch decreased in the LUE Deep Tendon Reflexes: Symmetric throughout Plantars: Right: mute   Left: mute Cerebellar: Normal finger-to-nose testing bilaterally Gait: not tested due to recent surgery    Laboratory Studies:  Basic Metabolic Panel: Recent Labs  Lab 12/14/19 1711 12/14/19 1711 12/15/19 0507 12/17/19 0626 12/18/19 0429  NA 140  --  141 141 141  K 3.9  --  4.3 4.7 3.7  CL 102  --  103 105 106  CO2 27  --  28 28 28   GLUCOSE 95  --  116* 131* 96  BUN 29*  --  28* 25* 26*  CREATININE 1.08*  --  0.93 0.93 1.02*  CALCIUM 9.2   < > 9.0 8.9 7.9*   < > = values in this interval not displayed.    Liver Function Tests: Recent Labs  Lab 12/14/19 1711  AST 17  ALT 12  ALKPHOS 87  BILITOT 0.9  PROT 6.8  ALBUMIN 3.9   No results for  input(s): LIPASE, AMYLASE in the last 168 hours. No results for input(s): AMMONIA in the last 168 hours.  CBC: Recent Labs  Lab 12/14/19 1711 12/15/19 0507 12/17/19 0626 12/18/19 0429  WBC 10.0 9.2 10.5 9.8  HGB 13.7 12.5 12.7 10.7*  HCT 41.7 37.5 38.4 32.3*  MCV 92.1 91.0 91.0 92.0  PLT 224 218 242 182    Cardiac Enzymes: No results for input(s): CKTOTAL, CKMB, CKMBINDEX, TROPONINI in the last 168 hours.  BNP: Invalid input(s): POCBNP  CBG: No results for input(s): GLUCAP in the last 168 hours.  Microbiology: Results for orders placed or performed during the hospital encounter of 12/14/19  SARS Coronavirus 2 by RT PCR (hospital order, performed in [facility] hospital lab) Nasopharyngeal Nasopharyngeal Swab     Status: None   Collection Time: 12/15/19 12:35 AM   Specimen: Nasopharyngeal Swab  Result Value Ref Range Status   SARS Coronavirus 2 NEGATIVE NEGATIVE Final    Comment: (NOTE) SARS-CoV-2 target nucleic acids are NOT DETECTED.  The SARS-CoV-2 RNA is generally detectable in upper and lower respiratory specimens  during the acute phase of infection. The lowest concentration of SARS-CoV-2 viral copies this assay can detect is 250 copies / mL. A negative result does not preclude SARS-CoV-2 infection and should not be used as the sole basis for treatment or other patient management decisions.  A negative result may occur with improper specimen collection / handling, submission of specimen other than nasopharyngeal swab, presence of viral mutation(s) within the areas targeted by this assay, and inadequate number of viral copies (<250 copies / mL). A negative result must be combined with clinical observations, patient history, and epidemiological information.  Fact Sheet for Patients:   StrictlyIdeas.no  Fact Sheet for Healthcare Providers: BankingDealers.co.za  This test is not yet approved or  cleared by the [place] FDA and has been authorized for detection and/or diagnosis of SARS-CoV-2 by FDA under an Emergency Use Authorization (EUA).  This EUA will remain in effect (meaning this test can be used) for the duration of the COVID-19 declaration under Section 564(b)(1) of the Act, 21 U.S.C. section 360bbb-3(b)(1), unless the authorization is terminated or revoked sooner.  Performed at [facility], [address] [place], [place] [address]   Surgical pcr screen     Status: None   Collection Time: 12/16/19  3:57 PM   Specimen: Nasal Mucosa; Nasal Swab  Result Value Ref Range Status   MRSA, PCR NEGATIVE NEGATIVE Final   Staphylococcus aureus NEGATIVE NEGATIVE Final    Comment: (NOTE) The Xpert SA Assay (FDA approved for NASAL specimens in patients 42 years of age and older), is one component of a comprehensive surveillance program. It is not intended to diagnose infection nor to guide or monitor treatment. Performed at [facility], [address] [place], [place] [address]     Coagulation Studies: No results for input(s): LABPROT, INR in the last 72 hours.  Urinalysis:  Recent Labs  Lab 12/14/19 2335  COLORURINE YELLOW*  LABSPEC 1.018  PHURINE 6.0  GLUCOSEU NEGATIVE  HGBUR NEGATIVE  BILIRUBINUR NEGATIVE  KETONESUR 20*  PROTEINUR NEGATIVE  NITRITE NEGATIVE  LEUKOCYTESUR NEGATIVE    Lipid Panel:    Component Value Date/Time   CHOL 171 09/03/2019 0928   TRIG 73 09/03/2019 0928   HDL 60 09/03/2019 0928   CHOLHDL 2.9 09/03/2019 0928   LDLCALC 95 09/03/2019 0928    HgbA1C:  Lab Results  Component Value Date   HGBA1C 5.7 (H) 09/03/2019    Urine Drug Screen:  No results found for: LABOPIA, COCAINSCRNUR, LABBENZ, AMPHETMU, THCU, LABBARB  Alcohol Level: No results for input(s): ETH in the last 168 hours.  Other results: EKG: normal sinus rhythm at 91 bpm.  Imaging: CT HEAD WO CONTRAST  Result Date: 12/18/2019 CLINICAL DATA:  Acute onset of  double vision EXAM: CT HEAD WITHOUT CONTRAST TECHNIQUE: Contiguous axial images were obtained from the base of the skull through the vertex without intravenous contrast. COMPARISON:  None. FINDINGS: Brain: No evidence of acute infarction, hemorrhage, hydrocephalus, extra-axial collection or mass lesion/mass effect. Grossly negative cisterns and cavernous sinus region. Vascular: No hyperdense vessel or unexpected calcification. Skull: Normal. Negative for fracture or focal lesion. Sinuses/Orbits: No acute finding. The covered orbits shows no visible inflammation or mass. IMPRESSION: Negative head CT. Electronically Signed   By: Monte Fantasia M.D.   On: 12/18/2019 06:37   MR BRAIN WO CONTRAST  Result Date: 12/18/2019 CLINICAL DATA:  Stroke, follow-up EXAM: MRI HEAD WITHOUT CONTRAST TECHNIQUE: Multiplanar, multiecho pulse sequences of the brain and surrounding structures were  obtained without intravenous contrast. COMPARISON:  Head CT 12/18/2019 FINDINGS: Brain: Cerebral volume is normal for age. There is a punctate focus of diffusion weighted hyperintensity within the anterior left frontal lobe (series 5, image 25). This is too small to accurately character on the ADC map. This may reflect a punctate acute infarct or artifact. No evidence of acute infarct elsewhere within the brain. A few scattered small foci of T2/FLAIR hyperintensity within the cerebral white matter are nonspecific, but may reflect minimal changes of chronic small vessel ischemic disease. 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 No evidence of intracranial mass. No chronic intracranial blood products. No extra-axial fluid collection. No midline shift. Vascular: Expected proximal arterial flow voids. Skull and upper cervical spine: No focal marrow lesion Sinuses/Orbits: Visualized orbits show no acute finding. Mild ethmoid sinus mucosal thickening. No significant mastoid effusion. IMPRESSION: Punctate acute  infarct versus artifact within the anterior left frontal lobe. No evidence of acute infarct elsewhere within the brain. A few small scattered foci of T2 hyperintensity within the cerebral white matter are nonspecific, but may reflect minimal chronic small vessel ischemic changes. 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 Electronically Signed   By: Kellie Simmering DO   On: 12/18/2019 09:04   DG HIP OPERATIVE UNILAT W OR W/O PELVIS RIGHT  Result Date: 12/17/2019 CLINICAL DATA:  Right hip arthroplasty. EXAM: OPERATIVE RIGHT HIP (WITH PELVIS IF PERFORMED) TECHNIQUE: Fluoroscopic spot image(s) were submitted for interpretation post-operatively. COMPARISON:  Preoperative CT. FINDINGS: Two fluoroscopic spot views obtained in the operating room. Right hip arthroplasty is in place. Fluoroscopy time 18 seconds. IMPRESSION: Intraoperative fluoroscopy during right hip arthroplasty. Electronically Signed   By: Keith Rake M.D.   On: 12/17/2019 15:58   DG HIP UNILAT W OR W/O PELVIS 2-3 VIEWS RIGHT  Result Date: 12/17/2019 CLINICAL DATA:  Postop total hip arthroplasty. EXAM: DG HIP (WITH OR WITHOUT PELVIS) 2-3V RIGHT COMPARISON:  None. FINDINGS: Right hip arthroplasty in expected alignment. No periprosthetic lucency or fractures. Recent postsurgical change includes air and edema in the soft tissues, skin staples and probable wound VAC in place. IMPRESSION: Right hip arthroplasty in expected alignment without immediate postoperative complication. Electronically Signed   By: Keith Rake M.D.   On: 12/17/2019 15:56    Assessment: 69 y.o. female with history of HTN and arthritis who underwent right total hip arthroplasty for right femoral head fracture on 7/8.  Overnight began to experience what she described as diplopia.  Patient s/p surgical procedure therefore was not a tPA candidate.  MRI of the brain performed and personally reviewed.  It shows  a punctate left frontal lobe infarct.  This does not explain the patient's symptoms.  I suspect an MR negative infarct in addition to the infarct noted on imaging which is concerning for an embolic etiology.     Stroke Risk Factors - hypertension  Plan: 1. HgbA1c, fasting lipid panel 2. CTA of the head and neck 3. PT consult, OT consult, Speech consult 4. Echocardiogram with bubble study 5. Prophylactic therapy-Antiplatelet med: Aspirin - dose 81mg  daily 6. NPO until RN stroke swallow screen 7. Telemetry monitoring 8. Frequent neuro checks 9. Ophthalmology consult as an outpatient   Alexis Goodell, MD Neurology [phone] 12/18/2019, 3:10 PM

## 2019-12-18 NOTE — Progress Notes (Addendum)
    BRIEF OVERNIGHT PROGRESS REPORT   SUBJECTIVE: Patient complained of acute onset diplopia associated with headache.  Patient states nothing seem to have precipitated episode such as postural changes or vision disturbance when eyes are moved into certain positions of gaze (gaze-evoked amaurosis). Denies associated altered sensorium, speech abnormality, vertigo, dizziness, focal motor or sensory deficits, nausea, or vomiting, ipsilateral or contralateral paralysis/weakness, numbness or tingling, involuntary movements, tremor.   OBJECTIVE:On side assessment, she was afebrile with blood pressure 148/79 mm Hg and pulse rate 88 beats/min. There were no obvious focal neurological deficits; however patient was noted with left hemianopsia and left finger-to-nose dysmetria.  No other associated neurological deficits noted.  ASSESSMENT/PLAN:  Acute onset left hemianopsia -concerns for ischemic event -STAT CT head obtained and showed no acute intracranial abnormality -Given persistent diplopia  With left hemianopsia will obtain MRI brain without contrast -Telemetry monitoring -Frequent neuro checks -Consider neurology consult if appropriate     Rufina Falco, DNP, CCRN, FNP-C [facility] Nurse Practitioner

## 2019-12-18 NOTE — Progress Notes (Signed)
SLP Cancellation Note  Patient Details Name: Linda Farley MRN: [id number] DOB: 05-Jan-1951   Cancelled treatment:       Reason Eval/Treat Not Completed: SLP screened, no needs identified, will sign off (chart reviewed; consulted NSG and Sister in room). NSG and Sister denied pt having any difficulty swallowing today. Pt is currently on a regular diet; tolerating swallowing pills w/ water per NSG and sips of liquids (seen on tray table in room) per Sister. Pt is conversing in conversation w/ Sister w/out deficits noted by Sister and NSG. No overt speech-language deficits appreciated by others around pt. There is a c/o Diplopia.  No further skilled ST services indicated as pt appears at her baseline. Pt agreed. NSG to reconsult if any new needs while admitted.     Orinda Kenner, MS, CCC-SLP Linda Farley 12/18/2019, 3:15 PM

## 2019-12-18 NOTE — Progress Notes (Signed)
PT Cancellation Note  Patient Details Name: Linda Farley MRN: [id number] DOB: 1951-04-20   Cancelled Treatment:    Reason Eval/Treat Not Completed: Patient at procedure or test/unavailable, will attempt to see pt at a future date/time as medically appropriate.    Linus Salmons PT, DPT 12/18/19, 3:57 PM

## 2019-12-18 NOTE — Progress Notes (Signed)
[facility] PROGRESS NOTE   Linda Farley IDP:[id number] DOB: 1950/11/29 DOA: 12/14/2019  PCP: [name], FNP  Brief History/Interval Summary: 68 y.o. Caucasian female with a known history of hypertension, depression, spinal stenosis with chronic low back pain, who presented to the emergency room with acute onset of worsening low back pain which has been intractable as well as right hip pain with radiation to her right leg with associated right leg weakness and burning without tingling or numbness.  She has an appointment for discussion about right hip arthroplasty with Dr. [name] in [facility] coming soon.  Patient was unable to weight-bear and ambulate.  She was hospitalized.  Neurosurgery as well as orthopedic surgery was consulted.  Reason for Visit: Significant pain in the right hip with inability to bear weight.  Consultants: Neurosurgery.  Orthopedics.  Procedures: None yet  Antibiotics: Anti-infectives (From admission, onward)   Start     Dose/Rate Route Frequency Ordered Stop   12/17/19 1930  ceFAZolin (ANCEF) IVPB 2g/100 mL premix        2 g 200 mL/hr over 30 Minutes Intravenous Every 6 hours 12/17/19 1715 12/18/19 0700   12/17/19 1200  ceFAZolin (ANCEF) IVPB 2g/100 mL premix        2 g 200 mL/hr over 30 Minutes Intravenous  Once 12/16/19 0745 12/17/19 1345      Subjective/Interval History: Overnight events noted.  Patient developed double vision at around 230 this morning.  She did not have any weakness in any 1 side of her body.  Has had some pain issues in the right hip.  Feels anxious.    Assessment/Plan:  Intractable right hip pain with inability to bear weight and ambulate Patient known to have arthritis.  She was supposed to see orthopedics in the outpatient setting to discuss hip joint replacement.  She is unable to bear weight on the right side.  Examination did not suggest any fractures.  She did have a CT scan which does not show any  fractures in that area.  Patient seen by orthopedics.  Patient underwent total hip replacement on 7/8.    Acute stroke Overnight events noted.  Patient developed vision issues with double vision.  MRI brain was done which shows evidence for acute infarct in the left frontal lobe.  Neurology has been consulted.  Will initiate stroke order set.  Palpitations EKG was repeated and shows heart rate to be 90.  Similar to EKG done at the time of admission.  Continue to monitor for now.  Chronic low back pain with radiculopathy Seen by neurosurgery.  MRI of the lumbar spine was done which does not show any acute findings.  She was started on intravenous dexamethasone.  Her back pain appears to be stable.  No plans for any surgical intervention currently.  Follow-up with her physiatrist.  Start tapering her steroids postoperatively.    Essential hypertension Blood pressure is reasonably well controlled.  On lisinopril/HCTZ at home.  Only lisinopril was continued.  We will hold it for now due to acute stroke.  Peripheral neuropathy Continue with gabapentin.  History of depression Continue with citalopram   DVT Prophylaxis: Lovenox Code Status: Full code Family Communication: Discussed with the patient Disposition Plan:  Status is: Inpatient  Remains inpatient appropriate because:Ongoing active pain requiring inpatient pain management   Dispo:  Patient From: Home  Planned Disposition: To be determined  Expected discharge date: 12/19/19  Medically stable for discharge: No  Medications:  Scheduled: . citalopram  20 mg Oral Daily  . docusate sodium  100 mg Oral BID  . enoxaparin (LOVENOX) injection  40 mg Subcutaneous Q24H  . gabapentin  300 mg Oral Daily  . gabapentin  600 mg Oral QHS  . lisinopril  20 mg Oral Daily  . magnesium oxide  400 mg Oral Daily  . polyethylene glycol  17 g Oral Daily   Continuous: . sodium chloride 10 mL/hr at 12/18/19 0944  . lactated ringers  10 mL/hr at 12/17/19 1255   UXL:[id number] **OR** acetaminophen, albuterol, alum & mag hydroxide-simeth, bisacodyl, diphenhydrAMINE, HYDROcodone-acetaminophen, ketorolac, magnesium citrate, magnesium hydroxide, magnesium hydroxide, menthol-cetylpyridinium **OR** phenol, metoCLOPramide **OR** metoCLOPramide (REGLAN) injection, morphine injection, ondansetron **OR** ondansetron (ZOFRAN) IV, ondansetron **OR** ondansetron (ZOFRAN) IV, sodium phosphate, traMADol, traZODone   Objective:  Vital Signs  Vitals:   12/17/19 2336 12/18/19 0513 12/18/19 0801 12/18/19 1238  BP: 118/66 (!) 148/79 121/67 125/69  Pulse: 85 88 77 97  Resp: 17 20 16 18   Temp: 99.2 F (37.3 C) 98.7 F (37.1 C) 99.3 F (37.4 C) 99.5 F (37.5 C)  TempSrc: Oral Oral Oral Oral  SpO2: 96% 99% 95% 98%  Weight:      Height:        Intake/Output Summary (Last 24 hours) at 12/18/2019 1310 Last data filed at 12/18/2019 0642 Gross per 24 hour  Intake 3161.94 ml  Output 1250 ml  Net 1911.94 ml   Filed Weights   12/14/19 1710  Weight: 81.6 kg   General appearance: Awake alert.  In no distress Resp: Clear to auscultation bilaterally.  Normal effort Cardio: S1-S2 is normal regular.  No S3-S4.  No rubs murmurs or bruit GI: Abdomen is soft.  Nontender nondistended.  Bowel sounds are present normal.  No masses organomegaly Extremities: Dressing noted over the right thigh area.  No obvious bruising noted. Neurologic: Alert and oriented x3.  No pronator drift.  Motor strength is equal bilateral upper and lower extremities.     Lab Results:  Data Reviewed: I have personally reviewed following labs and imaging studies  CBC: Recent Labs  Lab 12/14/19 1711 12/15/19 0507 12/17/19 0626 12/18/19 0429  WBC 10.0 9.2 10.5 9.8  HGB 13.7 12.5 12.7 10.7*  HCT 41.7 37.5 38.4 32.3*  MCV 92.1 91.0 91.0 92.0  PLT 224 218 242 440    Basic Metabolic Panel: Recent Labs  Lab 12/14/19 1711 12/15/19 0507 12/17/19 0626  12/18/19 0429  NA 140 141 141 141  K 3.9 4.3 4.7 3.7  CL 102 103 105 106  CO2 27 28 28 28   GLUCOSE 95 116* 131* 96  BUN 29* 28* 25* 26*  CREATININE 1.08* 0.93 0.93 1.02*  CALCIUM 9.2 9.0 8.9 7.9*    GFR: Estimated Creatinine Clearance: 54.9 mL/min (A) (by C-G formula based on SCr of 1.02 mg/dL (H)).  Liver Function Tests: Recent Labs  Lab 12/14/19 1711  AST 17  ALT 12  ALKPHOS 87  BILITOT 0.9  PROT 6.8  ALBUMIN 3.9     Recent Results (from the past 240 hour(s))  SARS Coronavirus 2 by RT PCR (hospital order, performed in [facility] hospital lab) Nasopharyngeal Nasopharyngeal Swab     Status: None   Collection Time: 12/15/19 12:35 AM   Specimen: Nasopharyngeal Swab  Result Value Ref Range Status   SARS Coronavirus 2 NEGATIVE NEGATIVE Final    Comment: (NOTE) SARS-CoV-2 target nucleic acids are NOT DETECTED.  The SARS-CoV-2 RNA is generally detectable in upper  and lower respiratory specimens during the acute phase of infection. The lowest concentration of SARS-CoV-2 viral copies this assay can detect is 250 copies / mL. A negative result does not preclude SARS-CoV-2 infection and should not be used as the sole basis for treatment or other patient management decisions.  A negative result may occur with improper specimen collection / handling, submission of specimen other than nasopharyngeal swab, presence of viral mutation(s) within the areas targeted by this assay, and inadequate number of viral copies (<250 copies / mL). A negative result must be combined with clinical observations, patient history, and epidemiological information.  Fact Sheet for Patients:   StrictlyIdeas.no  Fact Sheet for Healthcare Providers: BankingDealers.co.za  This test is not yet approved or  cleared by the [place] FDA and has been authorized for detection and/or diagnosis of SARS-CoV-2 by FDA under an Emergency Use Authorization  (EUA).  This EUA will remain in effect (meaning this test can be used) for the duration of the COVID-19 declaration under Section 564(b)(1) of the Act, 21 U.S.C. section 360bbb-3(b)(1), unless the authorization is terminated or revoked sooner.  Performed at [facility], [address], [place], [place] [address]   Surgical pcr screen     Status: None   Collection Time: 12/16/19  3:57 PM   Specimen: Nasal Mucosa; Nasal Swab  Result Value Ref Range Status   MRSA, PCR NEGATIVE NEGATIVE Final   Staphylococcus aureus NEGATIVE NEGATIVE Final    Comment: (NOTE) The Xpert SA Assay (FDA approved for NASAL specimens in patients 38 years of age and older), is one component of a comprehensive surveillance program. It is not intended to diagnose infection nor to guide or monitor treatment. Performed at [facility], [address] [place], [place] [address]       Radiology Studies: CT HEAD WO CONTRAST  Result Date: 12/18/2019 CLINICAL DATA:  Acute onset of double vision EXAM: CT HEAD WITHOUT CONTRAST TECHNIQUE: Contiguous axial images were obtained from the base of the skull through the vertex without intravenous contrast. COMPARISON:  None. FINDINGS: Brain: No evidence of acute infarction, hemorrhage, hydrocephalus, extra-axial collection or mass lesion/mass effect. Grossly negative cisterns and cavernous sinus region. Vascular: No hyperdense vessel or unexpected calcification. Skull: Normal. Negative for fracture or focal lesion. Sinuses/Orbits: No acute finding. The covered orbits shows no visible inflammation or mass. IMPRESSION: Negative head CT. Electronically Signed   By: Monte Fantasia M.D.   On: 12/18/2019 06:37   MR BRAIN WO CONTRAST  Result Date: 12/18/2019 CLINICAL DATA:  Stroke, follow-up EXAM: MRI HEAD WITHOUT CONTRAST TECHNIQUE: Multiplanar, multiecho pulse sequences of the brain and surrounding structures were obtained without intravenous contrast. COMPARISON:   Head CT 12/18/2019 FINDINGS: Brain: Cerebral volume is normal for age. There is a punctate focus of diffusion weighted hyperintensity within the anterior left frontal lobe (series 5, image 25). This is too small to accurately character on the ADC map. This may reflect a punctate acute infarct or artifact. No evidence of acute infarct elsewhere within the brain. A few scattered small foci of T2/FLAIR hyperintensity within the cerebral white matter are nonspecific, but may reflect minimal changes of chronic small vessel ischemic disease. 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 No evidence of intracranial mass. No chronic intracranial blood products. No extra-axial fluid collection. No midline shift. Vascular: Expected proximal arterial flow voids. Skull and upper cervical spine: No focal marrow lesion Sinuses/Orbits: Visualized orbits show no acute finding. Mild ethmoid sinus mucosal thickening.  No significant mastoid effusion. IMPRESSION: Punctate acute infarct versus artifact within the anterior left frontal lobe. No evidence of acute infarct elsewhere within the brain. A few small scattered foci of T2 hyperintensity within the cerebral white matter are nonspecific, but may reflect minimal chronic small vessel ischemic changes. 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 Electronically Signed   By: Kellie Simmering DO   On: 12/18/2019 09:04   DG HIP OPERATIVE UNILAT W OR W/O PELVIS RIGHT  Result Date: 12/17/2019 CLINICAL DATA:  Right hip arthroplasty. EXAM: OPERATIVE RIGHT HIP (WITH PELVIS IF PERFORMED) TECHNIQUE: Fluoroscopic spot image(s) were submitted for interpretation post-operatively. COMPARISON:  Preoperative CT. FINDINGS: Two fluoroscopic spot views obtained in the operating room. Right hip arthroplasty is in place. Fluoroscopy time 18 seconds. IMPRESSION: Intraoperative fluoroscopy during right hip  arthroplasty. Electronically Signed   By: Keith Rake M.D.   On: 12/17/2019 15:58   DG HIP UNILAT W OR W/O PELVIS 2-3 VIEWS RIGHT  Result Date: 12/17/2019 CLINICAL DATA:  Postop total hip arthroplasty. EXAM: DG HIP (WITH OR WITHOUT PELVIS) 2-3V RIGHT COMPARISON:  None. FINDINGS: Right hip arthroplasty in expected alignment. No periprosthetic lucency or fractures. Recent postsurgical change includes air and edema in the soft tissues, skin staples and probable wound VAC in place. IMPRESSION: Right hip arthroplasty in expected alignment without immediate postoperative complication. Electronically Signed   By: Keith Rake M.D.   On: 12/17/2019 15:56       LOS: 3 days   [facility] Pager on [email or web address]  12/18/2019, 1:10 PM

## 2019-12-18 NOTE — Progress Notes (Signed)
OT Cancellation Note  Patient Details Name: Linda Farley MRN: [id number] DOB: 1951-01-27   Cancelled Treatment:    Reason Eval/Treat Not Completed: Patient at procedure or test/ unavailable. OT order received and chart reviewed. Per conversation c RN pt c elevated HR and awaiting EKG procedure. Requesting hold OT eval this AM, will follow up at later time/date as able.   Dessie Coma, M.S. OTR/L  12/18/19, 11:23 AM

## 2019-12-19 ENCOUNTER — Inpatient Hospital Stay: Payer: PPO

## 2019-12-19 DIAGNOSIS — R509 Fever, unspecified: Secondary | ICD-10-CM

## 2019-12-19 LAB — BASIC METABOLIC PANEL
Anion gap: 5 (ref 5–15)
BUN: 22 mg/dL (ref 8–23)
CO2: 29 mmol/L (ref 22–32)
Calcium: 7.7 mg/dL — ABNORMAL LOW (ref 8.9–10.3)
Chloride: 107 mmol/L (ref 98–111)
Creatinine, Ser: 0.85 mg/dL (ref 0.44–1.00)
GFR calc Af Amer: >60 mL/min (ref >60)
GFR calc non Af Amer: >60 mL/min (ref >60)
Glucose, Bld: 106 mg/dL — ABNORMAL HIGH (ref 70–99)
Potassium: 4.1 mmol/L (ref 3.5–5.1)
Sodium: 141 mmol/L (ref 135–145)

## 2019-12-19 LAB — URINALYSIS, COMPLETE (UACMP) WITH MICROSCOPIC
Bacteria, UA: NONE SEEN
Bilirubin Urine: NEGATIVE
Glucose, UA: NEGATIVE mg/dL
Hgb urine dipstick: NEGATIVE
Ketones, ur: NEGATIVE mg/dL
Leukocytes,Ua: NEGATIVE
Nitrite: NEGATIVE
Protein, ur: NEGATIVE mg/dL
Specific Gravity, Urine: 1.023 (ref 1.005–1.030)
Squamous Epithelial / HPF: NONE SEEN (ref 0–5)
pH: 5 (ref 5.0–8.0)

## 2019-12-19 LAB — CBC
HCT: 31.6 % — ABNORMAL LOW (ref 36.0–46.0)
Hemoglobin: 10.3 g/dL — ABNORMAL LOW (ref 12.0–15.0)
MCH: 30 pg (ref 26.0–34.0)
MCHC: 32.6 g/dL (ref 30.0–36.0)
MCV: 92.1 fL (ref 80.0–100.0)
Platelets: 175 10*3/uL (ref 150–400)
RBC: 3.43 MIL/uL — ABNORMAL LOW (ref 3.87–5.11)
RDW: 14.6 % (ref 11.5–15.5)
WBC: 11.6 10*3/uL — ABNORMAL HIGH (ref 4.0–10.5)
nRBC: 0 % (ref 0.0–0.2)

## 2019-12-19 LAB — LIPID PANEL
Cholesterol: 142 mg/dL (ref 0–200)
HDL: 44 mg/dL (ref >40)
LDL Cholesterol: 82 mg/dL (ref 0–99)
Total CHOL/HDL Ratio: 3.2 RATIO
Triglycerides: 82 mg/dL (ref <150)
VLDL: 16 mg/dL (ref 0–40)

## 2019-12-19 LAB — HEMOGLOBIN A1C
Hgb A1c MFr Bld: 5.9 % — ABNORMAL HIGH (ref 4.8–5.6)
Mean Plasma Glucose: 122.63 mg/dL

## 2019-12-19 MED ORDER — SODIUM CHLORIDE 0.9 % IV BOLUS
1000.0000 mL | Freq: Once | INTRAVENOUS | Status: AC
  Administered 2019-12-19: 1000 mL via INTRAVENOUS

## 2019-12-19 MED ORDER — HYDROCODONE-ACETAMINOPHEN 5-325 MG PO TABS: 1.0000 | ORAL_TABLET | ORAL | 0 refills | Status: DC | PRN

## 2019-12-19 MED ORDER — ATORVASTATIN CALCIUM 20 MG PO TABS
20.0000 mg | ORAL_TABLET | Freq: Every day | ORAL | Status: DC
  Administered 2019-12-19 – 2019-12-23 (×5): 20 mg via ORAL
  Filled 2019-12-19 (×5): qty 1

## 2019-12-19 MED ORDER — ENOXAPARIN SODIUM 40 MG/0.4ML ~~LOC~~ SOLN: 40.0000 mg | SUBCUTANEOUS | 0 refills | Status: DC

## 2019-12-19 MED ORDER — CAMPHOR-MENTHOL 0.5-0.5 % EX LOTN
TOPICAL_LOTION | CUTANEOUS | Status: DC | PRN
  Filled 2019-12-19: qty 222

## 2019-12-19 NOTE — Progress Notes (Signed)
Physical Therapy Treatment Patient Details Name: Linda Farley MRN: [id number] DOB: [birth year] Today's Date: 12/19/2019    History of Present Illness Patient is a 69 year old female presenting to the ED with acute onset worsening low back pain as well as right hip pain with radiation to right leg with associated right leg weakness/burning without tingling or numbness.  Neurosurgery consulted and recommending conservative management for lumbar degenerative disease, spinal stenosis.  Pt diagnosed with right femoral head fracture and is s/p R THA.  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  MD assessment also includes palpitations, and chronic low back pain with radiculopathy. PMH includes: peripheral neuropathy, depression, and HTN.    PT Comments    Pt with increased independence with bed mobility this afternoon with modifications of LLE assisting RLE and good use of bedrails to aid in trunk negotiation. STS with RW with increased independence following cuing for set up and hand placement with minA for initiation of transfer. Patient able to take steps with minimal LLE advancement and minA for RW negotiation 68ft around room before needing to  Rest d/t fatigue. Patient is able to control lower to chair following cuing. Discussion with patient on continued recommendation for SNF placement at this time for safety, but encouragement that this may change with continued progress, patient and sister understanding. Would benefit from skilled PT to address above deficits and promote optimal return to PLOF.   Follow Up Recommendations  SNF     Equipment Recommendations  Rolling walker with 5" wheels    Recommendations for Other Services       Precautions / Restrictions Precautions Precautions: Fall;Anterior Hip Precaution Booklet Issued: Yes (comment) Restrictions Weight Bearing Restrictions: Yes RLE Weight Bearing: Weight bearing as  tolerated    Mobility  Bed Mobility Overal bed mobility: Needs Assistance Bed Mobility: Supine to Sit;Sit to Supine     Supine to sit: HOB elevated;Min assist     General bed mobility comments: Pt able to complete minA with RLE assisting LLE and bedrails  Transfers Overall transfer level: Needs assistance Equipment used: 1 person hand held assist Transfers: Sit to/from Stand;Stand Pivot Transfers Sit to Stand: Min assist Stand pivot transfers: Min assist       General transfer comment: Patient with increased ind this afternoon with STS and pivot bed > BSC following minA for initiation and cuing for hand placement/set up  Ambulation/Gait Ambulation/Gait assistance: Min guard;Min assist Gait Distance (Feet): 8 Feet Assistive device: Rolling walker (2 wheeled) Gait Pattern/deviations: Decreased step length - left;Decreased stance time - right;Shuffle Gait velocity: decreased   General Gait Details: step to gait, very small LLE advancement; minA for RW negotiation with turning   Stairs             Wheelchair Mobility    Modified Rankin (Stroke Patients Only)       Balance Overall balance assessment: Needs assistance;History of Falls Sitting-balance support: Feet unsupported;No upper extremity supported Sitting balance-Leahy Scale: Good     Standing balance support: Bilateral upper extremity supported Standing balance-Leahy Scale: Fair Standing balance comment: Mod lean on the RW for support                            Cognition Arousal/Alertness: Awake/alert Behavior During Therapy: WFL for tasks assessed/performed Overall Cognitive Status: Within Functional Limits for tasks assessed  Exercises Total Joint Exercises Ankle Circles/Pumps: AROM;20 reps;Both Short Arc Quad: Right;AROM;10 reps Heel Slides: 10 reps;AROM;Right Other Exercises Other Exercises: Supine > sit minA with handrails  and LLE assisting RLE with good carry over of cuing for this Other Exercises: STS with RW with good carry over of cuing for set up and hand placement, minA for initation; for sit cuing for controlled lower with good carry over Other Exercises: Amb 8 ft with minA for RW management, and balance. cuing for LLE advancement with small success.    General Comments General comments (skin integrity, edema, etc.): Rash visualized on pt's back - NT and RN aware       Pertinent Vitals/Pain Pain Assessment: Faces Faces Pain Scale: Hurts little more Pain Location: R hip Pain Descriptors / Indicators: Sore;Aching Pain Intervention(s): Limited activity within patient's tolerance    Home Living Family/patient expects to be discharged to:: Private residence Living Arrangements: Other relatives (sister) Available Help at Discharge: Family;Available 24 hours/day Type of Home: House Home Access: Ramped entrance   Home Layout: One level Home Equipment: Grab bars - toilet;Wheelchair - [employer] - 4 wheels;Cane - single point;Bedside commode Additional Comments: Has bilateral grab bars with toilet; pt to live with sister once discharged home for increased caregiver assistance. Able to live in late mothers ranch style handicap adapted home.    Prior Function Level of Independence: Independent with assistive device(s)  Gait / Transfers Assistance Needed: Patient reports 5-6 falls at home in the last 6 months secondary to "orthostatic hypotension" per sister, tripping, and/or LOB. patient states she ambulates very short distances using 2 single point canes but has been primarily in the bed recently due to hip and back pain (using "pee cup" in bed)       PT Goals (current goals can now be found in the care plan section) Acute Rehab PT Goals Patient Stated Goal: To walk better PT Goal Formulation: With patient Time For Goal Achievement: 12/31/19 Potential to Achieve Goals: Fair Progress towards PT goals:  Progressing toward goals    Frequency    BID      PT Plan      Co-evaluation              AM-PAC PT "6 Clicks" Mobility   Outcome Measure  Help needed turning from your back to your side while in a flat bed without using bedrails?: A Little Help needed moving from lying on your back to sitting on the side of a flat bed without using bedrails?: A Little Help needed moving to and from a bed to a chair (including a wheelchair)?: A Little Help needed standing up from a chair using your arms (e.g., wheelchair or bedside chair)?: A Little Help needed to walk in hospital room?: A Lot Help needed climbing 3-5 steps with a railing? : Total 6 Click Score: 15    End of Session Equipment Utilized During Treatment: Gait belt Activity Tolerance: Patient limited by pain Patient left: with call bell/phone within reach;with family/visitor present;with SCD's reapplied;Other (comment);in chair;with chair alarm set Nurse Communication: Mobility status;Weight bearing status PT Visit Diagnosis: Muscle weakness (generalized) (M62.81);Other abnormalities of gait and mobility (R26.89);Pain;History of falling (Z91.81);Other symptoms and signs involving the nervous system (R29.898) Pain - Right/Left: Right Pain - part of body: Hip     Time: 7342-8768 PT Time Calculation (min) (ACUTE ONLY): 38 min  Charges:  $Therapeutic Activity: 23-37 mins  Durwin Reges DPT Durwin Reges 12/19/2019, 4:27 PM

## 2019-12-19 NOTE — Progress Notes (Signed)
  Subjective: 2 Days Post-Op Procedure(s) (LRB): TOTAL HIP ARTHROPLASTY ANTERIOR APPROACH (Right) Patient reports pain as moderate.   Patient is well but MRI of the brain did reveal evidence of a embolitic stroke.  Work-up per neurology, denies any lower extremity symptoms. Current plan for discharge to SNF when medically stable. Negative for chest pain and shortness of breath Fever: no Gastrointestinal:Negative for nausea and vomiting Patient still with double vision this AM.  Objective: Vital signs in last 24 hours: Temp:  [98.4 F (36.9 C)-101.2 F (38.4 C)] 99.2 F (37.3 C) (07/10 0744) Pulse Rate:  [77-97] 89 (07/10 0744) Resp:  [16-18] 16 (07/10 0744) BP: (108-147)/(59-85) 147/85 (07/10 0744) SpO2:  [93 %-99 %] 93 % (07/10 0744)  Intake/Output from previous day: No intake or output data in the 24 hours ending 12/19/19 0756  Intake/Output this shift: No intake/output data recorded.  Labs: Recent Labs    12/17/19 0626 12/18/19 0429 12/19/19 0552  HGB 12.7 10.7* 10.3*   Recent Labs    12/18/19 0429 12/19/19 0552  WBC 9.8 11.6*  RBC 3.51* 3.43*  HCT 32.3* 31.6*  PLT 182 175   Recent Labs    12/18/19 0429 12/19/19 0552  NA 141 141  K 3.7 4.1  CL 106 107  CO2 28 29  BUN 26* 22  CREATININE 1.02* 0.85  GLUCOSE 96 106*  CALCIUM 7.9* 7.7*   No results for input(s): LABPT, INR in the last 72 hours.   EXAM General - Patient is Alert, Appropriate and Oriented Extremity - ABD soft Neurovascular intact Sensation intact distally Intact pulses distally Dorsiflexion/Plantar flexion intact No cellulitis present Dressing/Incision - No drainage noted to the prevena woundvac. Motor Function - intact, moving foot and toes well on exam.  Negative homans to the right leg.  Past Medical History:  Diagnosis Date  . Allergy   . Anxiety   . Arthritis   . Depression   . Hypertension     Assessment/Plan: 2 Days Post-Op Procedure(s) (LRB): TOTAL HIP  ARTHROPLASTY ANTERIOR APPROACH (Right) Active Problems:   Intractable low back pain  Estimated body mass index is 29.95 kg/m as calculated from the following:   Height as of this encounter: 5\' 5"  (1.651 m).   Weight as of this encounter: 81.6 kg. Advance diet Up with therapy   Labs reviewed. Patient with evidence of stroke on MRI.  Work-up per neurology. Patient has had a BM. Up with therapy today.  Staple can be removed by SNF on 12/31/19, follow-up with [facility] in 6 weeks for repeat x-rays. Continue with Lovenox 40mg  daily for 14 days for DVT prophylaxis, can change per neurology recommendation.  DVT Prophylaxis - Lovenox, Foot Pumps and TED hose Weight-Bearing as tolerated to right leg  J. Cameron Proud, PA-C [facility] Orthopaedic Surgery 12/19/2019, 7:56 AM

## 2019-12-19 NOTE — Discharge Instructions (Signed)
ANTERIOR APPROACH TOTAL HIP REPLACEMENT POSTOPERATIVE DIRECTIONS   Hip Rehabilitation, Guidelines Following Surgery  The results of a hip operation are greatly improved after range of motion and muscle strengthening exercises. Follow all safety measures which are given to protect your hip. If any of these exercises cause increased pain or swelling in your joint, decrease the amount until you are comfortable again. Then slowly increase the exercises. Call your caregiver if you have problems or questions.   HOME CARE INSTRUCTIONS  Remove items at home which could result in a fall. This includes throw rugs or furniture in walking pathways.   ICE to the affected hip every three hours for 30 minutes at a time and then as needed for pain and swelling.  Continue to use ice on the hip for pain and swelling from surgery. You may notice swelling that will progress down to the foot and ankle.  This is normal after surgery.  Elevate the leg when you are not up walking on it.    Continue to use the breathing machine which will help keep your temperature down.  It is common for your temperature to cycle up and down following surgery, especially at night when you are not up moving around and exerting yourself.  The breathing machine keeps your lungs expanded and your temperature down.  Do not place pillow under knee, focus on keeping the knee straight while resting  DIET You may resume your previous home diet once your are discharged from the hospital.  DRESSING / WOUND CARE / SHOWERING Leave prevena woundvac in place, will be changed by therapy when suction no longer working. Keep your dressing dry with showering.  You can keep it covered and pat dry. Change the surgical dressing daily and reapply a dry dressing each time.  ACTIVITY Walk with your walker as instructed. Use walker as long as suggested by your caregivers. Avoid periods of inactivity such as sitting longer than an hour when not asleep. This  helps prevent blood clots.  You may resume a sexual relationship in one month or when given the OK by your doctor.  You may return to work once you are cleared by your doctor.  Do not drive a car for 6 weeks or until released by you surgeon.  Do not drive while taking narcotics.  WEIGHT BEARING Weight bearing as tolerated with assist device (walker, cane, etc) as directed, use it as long as suggested by your surgeon or therapist, typically at least 4-6 weeks.  POSTOPERATIVE CONSTIPATION PROTOCOL Constipation - defined medically as fewer than three stools per week and severe constipation as less than one stool per week.  One of the most common issues patients have following surgery is constipation.  Even if you have a regular bowel pattern at home, your normal regimen is likely to be disrupted due to multiple reasons following surgery.  Combination of anesthesia, postoperative narcotics, change in appetite and fluid intake all can affect your bowels.  In order to avoid complications following surgery, here are some recommendations in order to help you during your recovery period.  Colace (docusate) - Pick up an over-the-counter form of Colace or another stool softener and take twice a day as long as you are requiring postoperative pain medications.  Take with a full glass of water daily.  If you experience loose stools or diarrhea, hold the colace until you stool forms back up.  If your symptoms do not get better within 1 week or if they get worse,  check with your doctor.  Dulcolax (bisacodyl) - Pick up over-the-counter and take as directed by the product packaging as needed to assist with the movement of your bowels.  Take with a full glass of water.  Use this product as needed if not relieved by Colace only.   MiraLax (polyethylene glycol) - Pick up over-the-counter to have on hand.  MiraLax is a solution that will increase the amount of water in your bowels to assist with bowel movements.  Take  as directed and can mix with a glass of water, juice, soda, coffee, or tea.  Take if you go more than two days without a movement. Do not use MiraLax more than once per day. Call your doctor if you are still constipated or irregular after using this medication for 7 days in a row.  If you continue to have problems with postoperative constipation, please contact the office for further assistance and recommendations.  If you experience "the worst abdominal pain ever" or develop nausea or vomiting, please contact the office immediatly for further recommendations for treatment.  ITCHING  If you experience itching with your medications, try taking only a single pain pill, or even half a pain pill at a time.  You can also use Benadryl over the counter for itching or also to help with sleep.   TED HOSE STOCKINGS Wear the elastic stockings on both legs for three weeks following surgery during the day but you may remove then at night for sleeping.  MEDICATIONS See your medication summary on the After Visit Summary that the nursing staff will review with you prior to discharge.  You may have some home medications which will be placed on hold until you complete the course of blood thinner medication.  It is important for you to complete the blood thinner medication as prescribed by your surgeon.  Continue your approved medications as instructed at time of discharge.  PRECAUTIONS If you experience chest pain or shortness of breath - call 911 immediately for transfer to the hospital emergency department.  If you develop a fever greater that 101 F, purulent drainage from wound, increased redness or drainage from wound, foul odor from the wound/dressing, or calf pain - CONTACT YOUR SURGEON.                                                   FOLLOW-UP APPOINTMENTS Make sure you keep all of your appointments after your operation with your surgeon and caregivers. You should call the office at the above phone  number and make an appointment for approximately two weeks after the date of your surgery or on the date instructed by your surgeon outlined in the "After Visit Summary".  RANGE OF MOTION AND STRENGTHENING EXERCISES  These exercises are designed to help you keep full movement of your hip joint. Follow your caregiver's or physical therapist's instructions. Perform all exercises about fifteen times, three times per day or as directed. Exercise both hips, even if you have had only one joint replacement. These exercises can be done on a training (exercise) mat, on the floor, on a table or on a bed. Use whatever works the best and is most comfortable for you. Use music or television while you are exercising so that the exercises are a pleasant break in your day. This will make your life better with  the exercises acting as a break in routine you can look forward to.  Lying on your back, slowly slide your foot toward your buttocks, raising your knee up off the floor. Then slowly slide your foot back down until your leg is straight again.  Lying on your back spread your legs as far apart as you can without causing discomfort.  Lying on your side, raise your upper leg and foot straight up from the floor as far as is comfortable. Slowly lower the leg and repeat.  Lying on your back, tighten up the muscle in the front of your thigh (quadriceps muscles). You can do this by keeping your leg straight and trying to raise your heel off the floor. This helps strengthen the largest muscle supporting your knee.  Lying on your back, tighten up the muscles of your buttocks both with the legs straight and with the knee bent at a comfortable angle while keeping your heel on the floor.   IF YOU ARE TRANSFERRED TO A SKILLED REHAB FACILITY If the patient is transferred to a skilled rehab facility following release from the hospital, a list of the current medications will be sent to the facility for the patient to continue.  When  discharged from the skilled rehab facility, please have the facility set up the patient's [place] prior to being released. Also, the skilled facility will be responsible for providing the patient with their medications at time of release from the facility to include their pain medication, the muscle relaxants, and their blood thinner medication. If the patient is still at the rehab facility at time of the two week follow up appointment, the skilled rehab facility will also need to assist the patient in arranging follow up appointment in our office and any transportation needs.  MAKE SURE YOU:  Understand these instructions.  Get help right away if you are not doing well or get worse.    Pick up stool softner and laxative for home use following surgery while on pain medications. Do not submerge incision under water. Please use good hand washing techniques while changing dressing each day. May shower starting three days after surgery. Please use a clean towel to pat the incision dry following showers. Continue to use ice for pain and swelling after surgery. Do not use any lotions or creams on the incision until instructed by your surgeon.

## 2019-12-19 NOTE — Progress Notes (Signed)
Subjective: No change in vision.  Febrile overnight.  Objective: Current vital signs: BP (!) 147/85 (BP Location: Right Arm)   Pulse 89   Temp 99.2 F (37.3 C) (Oral)   Resp 16   Ht 5\' 5"  (1.651 m)   Wt 81.6 kg   SpO2 93%   BMI 29.95 kg/m  Vital signs in last 24 hours: Temp:  [98.4 F (36.9 C)-101.2 F (38.4 C)] 99.2 F (37.3 C) (07/10 0744) Pulse Rate:  [79-97] 89 (07/10 0744) Resp:  [16-18] 16 (07/10 0744) BP: (108-147)/(59-85) 147/85 (07/10 0744) SpO2:  [93 %-99 %] 93 % (07/10 0744)  Intake/Output from previous day: No intake/output data recorded. Intake/Output this shift: Total I/O In: 360 [P.O.:360] Out: -  Nutritional status:  Diet Order            Diet regular Room service appropriate? Yes; Fluid consistency: Thin  Diet effective now                 Neurologic Exam: Mental Status: Alert, oriented, thought content appropriate.  Speech fluent without evidence of aphasia.  Able to follow 3 step commands without difficulty. Cranial Nerves: II: LHH, pupils equal, round, reactive to light and accommodation III,IV, VI: ptosis not present, extra-ocular motions intact bilaterally V,VII: smile symmetric, facial light touch sensation normal bilaterally VIII: hearing normal bilaterally IX,X: gag reflex present XI: bilateral shoulder shrug XII: midline tongue extension Motor: Right :  Upper extremity   5/5                                      Left:     Upper extremity   5/5             Lower extremity   Not tested                                       Lower extremity   5/5 Tone and bulk:normal tone throughout; no atrophy noted Sensory: Pinprick and light touch decreased in the LUE  Lab Results: Basic Metabolic Panel: Recent Labs  Lab 12/14/19 1711 12/14/19 1711 12/15/19 0507 12/15/19 0507 12/17/19 0626 12/18/19 0429 12/19/19 0552  NA 140  --  141  --  141 141 141  K 3.9  --  4.3  --  4.7 3.7 4.1  CL 102  --  103  --  105 106 107  CO2 27  --  28  --   28 28 29   GLUCOSE 95  --  116*  --  131* 96 106*  BUN 29*  --  28*  --  25* 26* 22  CREATININE 1.08*  --  0.93  --  0.93 1.02* 0.85  CALCIUM 9.2   < > 9.0   < > 8.9 7.9* 7.7*   < > = values in this interval not displayed.    Liver Function Tests: Recent Labs  Lab 12/14/19 1711  AST 17  ALT 12  ALKPHOS 87  BILITOT 0.9  PROT 6.8  ALBUMIN 3.9   No results for input(s): LIPASE, AMYLASE in the last 168 hours. No results for input(s): AMMONIA in the last 168 hours.  CBC: Recent Labs  Lab 12/14/19 1711 12/15/19 0507 12/17/19 0626 12/18/19 0429 12/19/19 0552  WBC 10.0 9.2 10.5 9.8 11.6*  HGB 13.7 12.5 12.7 10.7*  10.3*  HCT 41.7 37.5 38.4 32.3* 31.6*  MCV 92.1 91.0 91.0 92.0 92.1  PLT 224 218 242 182 175    Cardiac Enzymes: No results for input(s): CKTOTAL, CKMB, CKMBINDEX, TROPONINI in the last 168 hours.  Lipid Panel: Recent Labs  Lab 12/19/19 0552  CHOL 142  TRIG 82  HDL 44  CHOLHDL 3.2  VLDL 16  LDLCALC 82    CBG: No results for input(s): GLUCAP in the last 168 hours.  Microbiology: Results for orders placed or performed during the hospital encounter of 12/14/19  SARS Coronavirus 2 by RT PCR (hospital order, performed in [facility] hospital lab) Nasopharyngeal Nasopharyngeal Swab     Status: None   Collection Time: 12/15/19 12:35 AM   Specimen: Nasopharyngeal Swab  Result Value Ref Range Status   SARS Coronavirus 2 NEGATIVE NEGATIVE Final    Comment: (NOTE) SARS-CoV-2 target nucleic acids are NOT DETECTED.  The SARS-CoV-2 RNA is generally detectable in upper and lower respiratory specimens during the acute phase of infection. The lowest concentration of SARS-CoV-2 viral copies this assay can detect is 250 copies / mL. A negative result does not preclude SARS-CoV-2 infection and should not be used as the sole basis for treatment or other patient management decisions.  A negative result may occur with improper specimen collection / handling, submission  of specimen other than nasopharyngeal swab, presence of viral mutation(s) within the areas targeted by this assay, and inadequate number of viral copies (<250 copies / mL). A negative result must be combined with clinical observations, patient history, and epidemiological information.  Fact Sheet for Patients:   StrictlyIdeas.no  Fact Sheet for Healthcare Providers: BankingDealers.co.za  This test is not yet approved or  cleared by the [place] FDA and has been authorized for detection and/or diagnosis of SARS-CoV-2 by FDA under an Emergency Use Authorization (EUA).  This EUA will remain in effect (meaning this test can be used) for the duration of the COVID-19 declaration under Section 564(b)(1) of the Act, 21 U.S.C. section 360bbb-3(b)(1), unless the authorization is terminated or revoked sooner.  Performed at [facility], [address] [place], [place] [address]   Surgical pcr screen     Status: None   Collection Time: 12/16/19  3:57 PM   Specimen: Nasal Mucosa; Nasal Swab  Result Value Ref Range Status   MRSA, PCR NEGATIVE NEGATIVE Final   Staphylococcus aureus NEGATIVE NEGATIVE Final    Comment: (NOTE) The Xpert SA Assay (FDA approved for NASAL specimens in patients 86 years of age and older), is one component of a comprehensive surveillance program. It is not intended to diagnose infection nor to guide or monitor treatment. Performed at [facility], [address] [place], [place] [address]     Coagulation Studies: No results for input(s): LABPROT, INR in the last 72 hours.  Imaging: CT ANGIO HEAD W OR WO CONTRAST  Result Date: 12/18/2019 CLINICAL DATA:  Acute onset of diplopia and headache today. Small acute infarction question in the left frontal lobe. EXAM: CT ANGIOGRAPHY HEAD AND NECK TECHNIQUE: Multidetector CT imaging of the head and neck was performed using the standard protocol during  bolus administration of intravenous contrast. Multiplanar CT image reconstructions and MIPs were obtained to evaluate the vascular anatomy. Carotid stenosis measurements (when applicable) are obtained utilizing NASCET criteria, using the distal internal carotid diameter as the denominator. CONTRAST:  63mL OMNIPAQUE IOHEXOL 350 MG/ML SOLN COMPARISON:  CT and MRI same day FINDINGS: CTA NECK FINDINGS Aortic  arch: Normal. No atherosclerotic disease. Normal branching pattern without origin stenosis. Right carotid system: Common carotid artery widely patent to the bifurcation. Carotid bifurcation and ICA bulb are normal. Cervical ICA is normal. Left carotid system: Common carotid artery widely patent to the bifurcation. Carotid bifurcation and ICA bulb are normal. Cervical ICA is normal. Vertebral arteries: Both vertebral artery origins widely patent. Both vertebral arteries widely patent and normal through the cervical region to the foramen magnum. Skeleton: Ordinary spondylosis C5-6. Other neck: No soft tissue mass or lymphadenopathy. Upper chest: Negative Review of the MIP images confirms the above findings CTA HEAD FINDINGS Anterior circulation: Both internal carotid arteries widely patent through the skull base and siphon regions. No siphon atherosclerotic disease or stenosis. The anterior and middle cerebral vessels are patent without proximal stenosis, aneurysm or vascular malformation. No large or medium vessel occlusion. Posterior circulation: Both vertebral arteries widely patent to the basilar. No basilar stenosis. Posterior circulation branch vessels are normal. Venous sinuses: Patent and normal. Anatomic variants: None significant. Review of the MIP images confirms the above findings IMPRESSION: No visible atherosclerotic disease anywhere in the region studied. No intracranial vessel occlusion. No carotid bifurcation stenosis. Electronically Signed   By: Nelson Chimes M.D.   On: 12/18/2019 16:02   CT HEAD WO  CONTRAST  Result Date: 12/18/2019 CLINICAL DATA:  Acute onset of double vision EXAM: CT HEAD WITHOUT CONTRAST TECHNIQUE: Contiguous axial images were obtained from the base of the skull through the vertex without intravenous contrast. COMPARISON:  None. FINDINGS: Brain: No evidence of acute infarction, hemorrhage, hydrocephalus, extra-axial collection or mass lesion/mass effect. Grossly negative cisterns and cavernous sinus region. Vascular: No hyperdense vessel or unexpected calcification. Skull: Normal. Negative for fracture or focal lesion. Sinuses/Orbits: No acute finding. The covered orbits shows no visible inflammation or mass. IMPRESSION: Negative head CT. Electronically Signed   By: Monte Fantasia M.D.   On: 12/18/2019 06:37   CT ANGIO NECK W OR WO CONTRAST  Result Date: 12/18/2019 CLINICAL DATA:  Acute onset of diplopia and headache today. Small acute infarction question in the left frontal lobe. EXAM: CT ANGIOGRAPHY HEAD AND NECK TECHNIQUE: Multidetector CT imaging of the head and neck was performed using the standard protocol during bolus administration of intravenous contrast. Multiplanar CT image reconstructions and MIPs were obtained to evaluate the vascular anatomy. Carotid stenosis measurements (when applicable) are obtained utilizing NASCET criteria, using the distal internal carotid diameter as the denominator. CONTRAST:  24mL OMNIPAQUE IOHEXOL 350 MG/ML SOLN COMPARISON:  CT and MRI same day FINDINGS: CTA NECK FINDINGS Aortic arch: Normal. No atherosclerotic disease. Normal branching pattern without origin stenosis. Right carotid system: Common carotid artery widely patent to the bifurcation. Carotid bifurcation and ICA bulb are normal. Cervical ICA is normal. Left carotid system: Common carotid artery widely patent to the bifurcation. Carotid bifurcation and ICA bulb are normal. Cervical ICA is normal. Vertebral arteries: Both vertebral artery origins widely patent. Both vertebral arteries  widely patent and normal through the cervical region to the foramen magnum. Skeleton: Ordinary spondylosis C5-6. Other neck: No soft tissue mass or lymphadenopathy. Upper chest: Negative Review of the MIP images confirms the above findings CTA HEAD FINDINGS Anterior circulation: Both internal carotid arteries widely patent through the skull base and siphon regions. No siphon atherosclerotic disease or stenosis. The anterior and middle cerebral vessels are patent without proximal stenosis, aneurysm or vascular malformation. No large or medium vessel occlusion. Posterior circulation: Both vertebral arteries widely patent to the basilar. No basilar  stenosis. Posterior circulation branch vessels are normal. Venous sinuses: Patent and normal. Anatomic variants: None significant. Review of the MIP images confirms the above findings IMPRESSION: No visible atherosclerotic disease anywhere in the region studied. No intracranial vessel occlusion. No carotid bifurcation stenosis. Electronically Signed   By: Nelson Chimes M.D.   On: 12/18/2019 16:02   MR BRAIN WO CONTRAST  Result Date: 12/18/2019 CLINICAL DATA:  Stroke, follow-up EXAM: MRI HEAD WITHOUT CONTRAST TECHNIQUE: Multiplanar, multiecho pulse sequences of the brain and surrounding structures were obtained without intravenous contrast. COMPARISON:  Head CT 12/18/2019 FINDINGS: Brain: Cerebral volume is normal for age. There is a punctate focus of diffusion weighted hyperintensity within the anterior left frontal lobe (series 5, image 25). This is too small to accurately character on the ADC map. This may reflect a punctate acute infarct or artifact. No evidence of acute infarct elsewhere within the brain. A few scattered small foci of T2/FLAIR hyperintensity within the cerebral white matter are nonspecific, but may reflect minimal changes of chronic small vessel ischemic disease. 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 No evidence of  intracranial mass. No chronic intracranial blood products. No extra-axial fluid collection. No midline shift. Vascular: Expected proximal arterial flow voids. Skull and upper cervical spine: No focal marrow lesion Sinuses/Orbits: Visualized orbits show no acute finding. Mild ethmoid sinus mucosal thickening. No significant mastoid effusion. IMPRESSION: Punctate acute infarct versus artifact within the anterior left frontal lobe. No evidence of acute infarct elsewhere within the brain. A few small scattered foci of T2 hyperintensity within the cerebral white matter are nonspecific, but may reflect minimal chronic small vessel ischemic changes. 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 Electronically Signed   By: Kellie Simmering DO   On: 12/18/2019 09:04   US Carotid Bilateral (at [facility] and [facility] only)  Result Date: 12/18/2019 CLINICAL DATA:  Stroke-like symptoms EXAM: BILATERAL CAROTID DUPLEX ULTRASOUND TECHNIQUE: Pearline Cables scale imaging, color Doppler and duplex ultrasound were performed of bilateral carotid and vertebral arteries in the neck. COMPARISON:  None. FINDINGS: Criteria: Quantification of carotid stenosis is based on velocity parameters that correlate the residual internal carotid diameter with NASCET-based stenosis levels, using the diameter of the distal internal carotid lumen as the denominator for stenosis measurement. The following velocity measurements were obtained: RIGHT ICA: 75/32 cm/sec CCA: 46/50 cm/sec SYSTOLIC ICA/CCA RATIO:  0.9 ECA:  83 cm/sec LEFT ICA: 110/25 cm/sec CCA: 35/46 cm/sec SYSTOLIC ICA/CCA RATIO:  1.5 ECA:  121 cm/sec RIGHT CAROTID ARTERY: Trace heterogeneous atherosclerotic plaque in the carotid bifurcation without extension into the internal carotid artery. No evidence of internal carotid artery stenosis. RIGHT VERTEBRAL ARTERY:  Patent with normal antegrade flow. LEFT CAROTID ARTERY: 8 no significant atherosclerotic  plaque or evidence of stenosis. LEFT VERTEBRAL ARTERY:  Patent with normal antegrade flow. Electronically Signed   By: Jacqulynn Cadet M.D.   On: 12/18/2019 16:04   ECHOCARDIOGRAM COMPLETE BUBBLE STUDY  Result Date: 12/18/2019    ECHOCARDIOGRAM REPORT   Patient Name:   Linda Farley Date of Exam: 12/18/2019 Medical Rec #:  [id number]     Height:       65.0 in Accession #:    [id number]    Weight:       180.0 lb Date of Birth:  [birth year]     BSA:          1.892 m Patient Age:    69 years  BP:           110/66 mmHg Patient Gender: F             HR:           79 bpm. Exam Location:  [facility] Procedure: 2D Echo, Color Doppler, Cardiac Doppler and Saline Contrast Bubble            Study Indications:     Stroke 434.91  History:         Patient has no prior history of Echocardiogram examinations.                  Risk Factors:Hypertension.  Sonographer:     Sherrie Sport RDCS (AE) Referring Phys:  [place] Diagnosing Phys: Ida Rogue MD IMPRESSIONS  1. Left ventricular ejection fraction, by estimation, is 55 to 60%. The left ventricle has normal function. The left ventricle has no regional wall motion abnormalities. Left ventricular diastolic parameters are consistent with Grade I diastolic dysfunction (impaired relaxation).  2. Right ventricular systolic function is normal. The right ventricular size is normal. There is normal pulmonary artery systolic pressure.  3. The mitral valve is normal in structure. Mild mitral valve regurgitation. No evidence of mitral stenosis.  4. Negative saline contrast bubble study. FINDINGS  Left Ventricle: Left ventricular ejection fraction, by estimation, is 55 to 60%. The left ventricle has normal function. The left ventricle has no regional wall motion abnormalities. The left ventricular internal cavity size was normal in size. There is  no left ventricular hypertrophy. Left ventricular diastolic parameters are consistent with Grade I diastolic dysfunction (impaired  relaxation). Right Ventricle: The right ventricular size is normal. No increase in right ventricular wall thickness. Right ventricular systolic function is normal. There is normal pulmonary artery systolic pressure. The tricuspid regurgitant velocity is 1.85 m/s, and  with an assumed right atrial pressure of 10 mmHg, the estimated right ventricular systolic pressure is 62.8 mmHg. Left Atrium: Left atrial size was normal in size. Right Atrium: Right atrial size was normal in size. Pericardium: There is no evidence of pericardial effusion. Mitral Valve: The mitral valve is normal in structure. Normal mobility of the mitral valve leaflets. Mild mitral valve regurgitation. No evidence of mitral valve stenosis. Tricuspid Valve: The tricuspid valve is normal in structure. Tricuspid valve regurgitation is mild . No evidence of tricuspid stenosis. Aortic Valve: The aortic valve is normal in structure. Aortic valve regurgitation is not visualized. No aortic stenosis is present. Aortic valve mean gradient measures 4.0 mmHg. Aortic valve peak gradient measures 7.4 mmHg. Aortic valve area, by VTI measures 2.08 cm. Pulmonic Valve: The pulmonic valve was normal in structure. Pulmonic valve regurgitation is not visualized. No evidence of pulmonic stenosis. Aorta: The aortic root is normal in size and structure. Venous: The inferior vena cava is normal in size with greater than 50% respiratory variability, suggesting right atrial pressure of 3 mmHg. IAS/Shunts: No atrial level shunt detected by color flow Doppler. Agitated saline contrast was given intravenously to evaluate for intracardiac shunting.  LEFT VENTRICLE PLAX 2D LVIDd:         4.80 cm  Diastology LVIDs:         2.69 cm  LV e' lateral:   10.40 cm/s LV PW:         1.06 cm  LV E/e' lateral: 5.9 LV IVS:        1.11 cm  LV e' medial:    6.74 cm/s LVOT diam:  2.00 cm  LV E/e' medial:  9.1 LV SV:         51 LV SV Index:   27 LVOT Area:     3.14 cm  RIGHT VENTRICLE RV  Basal diam:  2.91 cm RV S prime:     13.30 cm/s TAPSE (M-mode): 3.4 cm LEFT ATRIUM           Index       RIGHT ATRIUM           Index LA diam:      3.50 cm 1.85 cm/m  RA Area:     22.10 cm LA Vol (A2C): 36.7 ml 19.40 ml/m RA Volume:   69.00 ml  36.48 ml/m LA Vol (A4C): 54.8 ml 28.97 ml/m  AORTIC VALVE                   PULMONIC VALVE AV Area (Vmax):    1.57 cm    PV Vmax:        0.81 m/s AV Area (Vmean):   1.78 cm    PV Peak grad:   2.6 mmHg AV Area (VTI):     2.08 cm    RVOT Peak grad: 3 mmHg AV Vmax:           136.00 cm/s AV Vmean:          89.000 cm/s AV VTI:            0.246 m AV Peak Grad:      7.4 mmHg AV Mean Grad:      4.0 mmHg LVOT Vmax:         68.00 cm/s LVOT Vmean:        50.300 cm/s LVOT VTI:          0.163 m LVOT/AV VTI ratio: 0.66  AORTA Ao Root diam: 2.60 cm MITRAL VALVE               TRICUSPID VALVE MV Area (PHT): 2.93 cm    TR Peak grad:   13.7 mmHg MV Decel Time: 259 msec    TR Vmax:        185.00 cm/s MV E velocity: 61.60 cm/s MV A velocity: 69.40 cm/s  SHUNTS MV E/A ratio:  0.89        Systemic VTI:  0.16 m                            Systemic Diam: 2.00 cm Ida Rogue MD Electronically signed by Ida Rogue MD Signature Date/Time: 12/18/2019/6:51:30 PM    Final    DG HIP OPERATIVE UNILAT W OR W/O PELVIS RIGHT  Result Date: 12/17/2019 CLINICAL DATA:  Right hip arthroplasty. EXAM: OPERATIVE RIGHT HIP (WITH PELVIS IF PERFORMED) TECHNIQUE: Fluoroscopic spot image(s) were submitted for interpretation post-operatively. COMPARISON:  Preoperative CT. FINDINGS: Two fluoroscopic spot views obtained in the operating room. Right hip arthroplasty is in place. Fluoroscopy time 18 seconds. IMPRESSION: Intraoperative fluoroscopy during right hip arthroplasty. Electronically Signed   By: Keith Rake M.D.   On: 12/17/2019 15:58   DG HIP UNILAT W OR W/O PELVIS 2-3 VIEWS RIGHT  Result Date: 12/17/2019 CLINICAL DATA:  Postop total hip arthroplasty. EXAM: DG HIP (WITH OR WITHOUT PELVIS) 2-3V  RIGHT COMPARISON:  None. FINDINGS: Right hip arthroplasty in expected alignment. No periprosthetic lucency or fractures. Recent postsurgical change includes air and edema in the soft tissues, skin staples and probable wound VAC in place. IMPRESSION:  Right hip arthroplasty in expected alignment without immediate postoperative complication. Electronically Signed   By: Keith Rake M.D.   On: 12/17/2019 15:56    Medications:  I have reviewed the patient's current medications. Scheduled: . aspirin  300 mg Rectal Daily   Or  . aspirin  325 mg Oral Daily  . atorvastatin  20 mg Oral Daily  . citalopram  20 mg Oral Daily  . docusate sodium  100 mg Oral BID  . enoxaparin (LOVENOX) injection  40 mg Subcutaneous Q24H  . gabapentin  300 mg Oral Daily  . gabapentin  600 mg Oral QHS  . lisinopril  20 mg Oral Daily  . magnesium oxide  400 mg Oral Daily  . polyethylene glycol  17 g Oral Daily    Assessment/Plan: 69 y.o. female with history of HTN and arthritis who underwent right total hip arthroplasty for right femoral head fracture on 7/8.  Overnight began to experience what she described as diplopia.  Patient s/p surgical procedure therefore was not a tPA candidate.  MRI of the brain performed and personally reviewed.  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  This does not explain the patient's symptoms.  I suspect an MR negative infarct in addition to the infarct noted on imaging which is concerning for an embolic etiology.  CTA of the head and neck shows no evidence of hemodynamically significant stenosis.  Echocardiogram shows no cardiac source of emboli or PFO with an EF of 55-60%.  A1c pending, LDL 82.  BP controlled.  Stroke Risk Factors - hypertension  Plan: 1. HgbA1c pending 2. Statin for lipid management with target LDL<70. 3. PT consult, OT consult, Speech consult 4. Prophylactic therapy-Antiplatelet med: Aspirin - dose 81mg  daily 5. Telemetry monitoring 6. Frequent neuro  checks 7. Ophthalmology consult as an outpatient   LOS: 4 days   Alexis Goodell, MD Neurology [phone] 12/19/2019  11:42 AM

## 2019-12-19 NOTE — Progress Notes (Signed)
[facility] PROGRESS NOTE   Linda Farley OMV:[id number] DOB: 03-20-1951 DOA: 12/14/2019  PCP: [name], FNP  Brief History/Interval Summary: 69 y.o. Caucasian female with a known history of hypertension, depression, spinal stenosis with chronic low back pain, who presented to the emergency room with acute onset of worsening low back pain which has been intractable as well as right hip pain with radiation to her right leg with associated right leg weakness and burning without tingling or numbness.  She has an appointment for discussion about right hip arthroplasty with Dr. [name] in [place] clinic coming soon.  Patient was unable to weight-bear and ambulate.  She was hospitalized.  Neurosurgery as well as orthopedic surgery was consulted.  Reason for Visit: Significant pain in the right hip with inability to bear weight.  Consultants: Neurosurgery.  Orthopedics.  Procedures: None yet  Antibiotics: Anti-infectives (From admission, onward)   Start     Dose/Rate Route Frequency Ordered Stop   12/17/19 1930  ceFAZolin (ANCEF) IVPB 2g/100 mL premix        2 g 200 mL/hr over 30 Minutes Intravenous Every 6 hours 12/17/19 1715 12/18/19 0700   12/17/19 1200  ceFAZolin (ANCEF) IVPB 2g/100 mL premix        2 g 200 mL/hr over 30 Minutes Intravenous  Once 12/16/19 0745 12/17/19 1345      Subjective/Interval History: Patient had fever earlier this morning.  She denies any cough or shortness of breath.  No dysuria.  No diarrhea.  Continues to have double vision especially when she is looking to the left.      Assessment/Plan:  Intractable right hip pain with inability to bear weight and ambulate Patient known to have arthritis.  She was supposed to see orthopedics in the outpatient setting to discuss hip joint replacement.  She is unable to bear weight on the right side.  Examination did not suggest any fractures.  She did have a CT scan which does not show any fractures in that  area.  Patient seen by orthopedics.  Patient underwent total hip replacement on 7/8.    Acute stroke Patient developed double vision early morning on 7/9.  MRI brain shows evidence for acute infarct in the left lobe.  Neurology was consulted.  Stroke work-up was initiated.  Patient currently on aspirin.  Echocardiogram does not show any acute findings.  Normal systolic function.  Bubble study was negative.  CT angiogram of the head and neck does not show any significant stenosis. LDL is noted to be 82.  Patient will benefit from being on a statin medication.  Further management per neurology.   Fever Noted to have fever overnight.  No obvious source identified based on history.  Will check a UA and do a chest x-ray.  Could be due to surgery or stroke.  If infectious work-up is unremarkable may need to do lower extremity Doppler studies if her fever persists.  Palpitations EKG was repeated and shows heart rate to be 90.  Similar to EKG done at the time of admission.  Appears to have resolved.  Chronic low back pain with radiculopathy Seen by neurosurgery.  MRI of the lumbar spine was done which does not show any acute findings.  She was given intravenous dexamethasone for a few days.  Her back pain appears to be stable.  No plans for any surgical intervention currently.  Follow-up with her physiatrist.   Essential hypertension Blood pressure is reasonably well controlled.  On lisinopril/HCTZ at  home.  Only lisinopril was continued.  Holding for permissive hypertension due to stroke.  Peripheral neuropathy Continue with gabapentin.  History of depression Continue with citalopram   DVT Prophylaxis: Lovenox Code Status: Full code Family Communication: Discussed with the patient Disposition Plan:  Status is: Inpatient  Remains inpatient appropriate because:Ongoing active pain requiring inpatient pain management   Dispo:  Patient From: Home  Planned Disposition: SNF  Expected discharge  date: 12/21/19  Medically stable for discharge: No       Medications:  Scheduled: . aspirin  300 mg Rectal Daily   Or  . aspirin  325 mg Oral Daily  . citalopram  20 mg Oral Daily  . docusate sodium  100 mg Oral BID  . enoxaparin (LOVENOX) injection  40 mg Subcutaneous Q24H  . gabapentin  300 mg Oral Daily  . gabapentin  600 mg Oral QHS  . lisinopril  20 mg Oral Daily  . magnesium oxide  400 mg Oral Daily  . polyethylene glycol  17 g Oral Daily   Continuous: . sodium chloride Stopped (12/18/19 1247)  . lactated ringers 10 mL/hr at 12/17/19 1255   KDT:OIZTIWPYKDXIP **OR** acetaminophen, albuterol, alum & mag hydroxide-simeth, bisacodyl, diphenhydrAMINE, HYDROcodone-acetaminophen, ketorolac, magnesium citrate, magnesium hydroxide, magnesium hydroxide, menthol-cetylpyridinium **OR** phenol, metoCLOPramide **OR** metoCLOPramide (REGLAN) injection, morphine injection, ondansetron **OR** ondansetron (ZOFRAN) IV, ondansetron **OR** ondansetron (ZOFRAN) IV, sodium phosphate, traMADol, traZODone   Objective:  Vital Signs  Vitals:   12/19/19 0206 12/19/19 0354 12/19/19 0543 12/19/19 0744  BP: 126/79  130/69 (!) 147/85  Pulse: 90  82 89  Resp: 16  16 16   Temp: (!) 100.5 F (38.1 C) (!) 101.2 F (38.4 C) 98.8 F (37.1 C) 99.2 F (37.3 C)  TempSrc: Oral Oral Oral Oral  SpO2: 95%  99% 93%  Weight:      Height:        Intake/Output Summary (Last 24 hours) at 12/19/2019 1120 Last data filed at 12/19/2019 1008 Gross per 24 hour  Intake 360 ml  Output --  Net 360 ml   Filed Weights   12/14/19 1710  Weight: 81.6 kg   General appearance: Awake alert.  In no distress Resp: Clear to auscultation bilaterally.  Normal effort Cardio: S1-S2 is normal regular.  No S3-S4.  No rubs murmurs or bruit GI: Abdomen is soft.  Nontender nondistended.  Bowel sounds are present normal.  No masses organomegaly Extremities: No edema.  Difficulty moving the right lower extremity due to recent  surgery. No pronator drift.  No other focal motor deficits noted.  Lab Results:  Data Reviewed: I have personally reviewed following labs and imaging studies  CBC: Recent Labs  Lab 12/14/19 1711 12/15/19 0507 12/17/19 0626 12/18/19 0429 12/19/19 0552  WBC 10.0 9.2 10.5 9.8 11.6*  HGB 13.7 12.5 12.7 10.7* 10.3*  HCT 41.7 37.5 38.4 32.3* 31.6*  MCV 92.1 91.0 91.0 92.0 92.1  PLT 224 218 242 182 382    Basic Metabolic Panel: Recent Labs  Lab 12/14/19 1711 12/15/19 0507 12/17/19 0626 12/18/19 0429 12/19/19 0552  NA 140 141 141 141 141  K 3.9 4.3 4.7 3.7 4.1  CL 102 103 105 106 107  CO2 27 28 28 28 29   GLUCOSE 95 116* 131* 96 106*  BUN 29* 28* 25* 26* 22  CREATININE 1.08* 0.93 0.93 1.02* 0.85  CALCIUM 9.2 9.0 8.9 7.9* 7.7*    GFR: Estimated Creatinine Clearance: 65.9 mL/min (by C-G formula based on SCr of 0.85 mg/dL).  Liver Function Tests: Recent Labs  Lab 12/14/19 1711  AST 17  ALT 12  ALKPHOS 87  BILITOT 0.9  PROT 6.8  ALBUMIN 3.9     Recent Results (from the past 240 hour(s))  SARS Coronavirus 2 by RT PCR (hospital order, performed in [facility] hospital lab) Nasopharyngeal Nasopharyngeal Swab     Status: None   Collection Time: 12/15/19 12:35 AM   Specimen: Nasopharyngeal Swab  Result Value Ref Range Status   SARS Coronavirus 2 NEGATIVE NEGATIVE Final    Comment: (NOTE) SARS-CoV-2 target nucleic acids are NOT DETECTED.  The SARS-CoV-2 RNA is generally detectable in upper and lower respiratory specimens during the acute phase of infection. The lowest concentration of SARS-CoV-2 viral copies this assay can detect is 250 copies / mL. A negative result does not preclude SARS-CoV-2 infection and should not be used as the sole basis for treatment or other patient management decisions.  A negative result may occur with improper specimen collection / handling, submission of specimen other than nasopharyngeal swab, presence of viral mutation(s) within  the areas targeted by this assay, and inadequate number of viral copies (<250 copies / mL). A negative result must be combined with clinical observations, patient history, and epidemiological information.  Fact Sheet for Patients:   StrictlyIdeas.no  Fact Sheet for Healthcare Providers: BankingDealers.co.za  This test is not yet approved or  cleared by the [place] FDA and has been authorized for detection and/or diagnosis of SARS-CoV-2 by FDA under an Emergency Use Authorization (EUA).  This EUA will remain in effect (meaning this test can be used) for the duration of the COVID-19 declaration under Section 564(b)(1) of the Act, 21 U.S.C. section 360bbb-3(b)(1), unless the authorization is terminated or revoked sooner.  Performed at [facility], [address], [place], [place] [address]   Surgical pcr screen     Status: None   Collection Time: 12/16/19  3:57 PM   Specimen: Nasal Mucosa; Nasal Swab  Result Value Ref Range Status   MRSA, PCR NEGATIVE NEGATIVE Final   Staphylococcus aureus NEGATIVE NEGATIVE Final    Comment: (NOTE) The Xpert SA Assay (FDA approved for NASAL specimens in patients 67 years of age and older), is one component of a comprehensive surveillance program. It is not intended to diagnose infection nor to guide or monitor treatment. Performed at [facility], [address], [place], [place] [address]       Radiology Studies: CT ANGIO HEAD W OR WO CONTRAST  Result Date: 12/18/2019 CLINICAL DATA:  Acute onset of diplopia and headache today. Small acute infarction question in the left frontal lobe. EXAM: CT ANGIOGRAPHY HEAD AND NECK TECHNIQUE: Multidetector CT imaging of the head and neck was performed using the standard protocol during bolus administration of intravenous contrast. Multiplanar CT image reconstructions and MIPs were obtained to evaluate the vascular anatomy. Carotid  stenosis measurements (when applicable) are obtained utilizing NASCET criteria, using the distal internal carotid diameter as the denominator. CONTRAST:  23mL OMNIPAQUE IOHEXOL 350 MG/ML SOLN COMPARISON:  CT and MRI same day FINDINGS: CTA NECK FINDINGS Aortic arch: Normal. No atherosclerotic disease. Normal branching pattern without origin stenosis. Right carotid system: Common carotid artery widely patent to the bifurcation. Carotid bifurcation and ICA bulb are normal. Cervical ICA is normal. Left carotid system: Common carotid artery widely patent to the bifurcation. Carotid bifurcation and ICA bulb are normal. Cervical ICA is normal. Vertebral arteries: Both vertebral artery origins widely patent. Both vertebral arteries widely patent  and normal through the cervical region to the foramen magnum. Skeleton: Ordinary spondylosis C5-6. Other neck: No soft tissue mass or lymphadenopathy. Upper chest: Negative Review of the MIP images confirms the above findings CTA HEAD FINDINGS Anterior circulation: Both internal carotid arteries widely patent through the skull base and siphon regions. No siphon atherosclerotic disease or stenosis. The anterior and middle cerebral vessels are patent without proximal stenosis, aneurysm or vascular malformation. No large or medium vessel occlusion. Posterior circulation: Both vertebral arteries widely patent to the basilar. No basilar stenosis. Posterior circulation branch vessels are normal. Venous sinuses: Patent and normal. Anatomic variants: None significant. Review of the MIP images confirms the above findings IMPRESSION: No visible atherosclerotic disease anywhere in the region studied. No intracranial vessel occlusion. No carotid bifurcation stenosis. Electronically Signed   By: Nelson Chimes M.D.   On: 12/18/2019 16:02   CT HEAD WO CONTRAST  Result Date: 12/18/2019 CLINICAL DATA:  Acute onset of double vision EXAM: CT HEAD WITHOUT CONTRAST TECHNIQUE: Contiguous axial images  were obtained from the base of the skull through the vertex without intravenous contrast. COMPARISON:  None. FINDINGS: Brain: No evidence of acute infarction, hemorrhage, hydrocephalus, extra-axial collection or mass lesion/mass effect. Grossly negative cisterns and cavernous sinus region. Vascular: No hyperdense vessel or unexpected calcification. Skull: Normal. Negative for fracture or focal lesion. Sinuses/Orbits: No acute finding. The covered orbits shows no visible inflammation or mass. IMPRESSION: Negative head CT. Electronically Signed   By: Monte Fantasia M.D.   On: 12/18/2019 06:37   CT ANGIO NECK W OR WO CONTRAST  Result Date: 12/18/2019 CLINICAL DATA:  Acute onset of diplopia and headache today. Small acute infarction question in the left frontal lobe. EXAM: CT ANGIOGRAPHY HEAD AND NECK TECHNIQUE: Multidetector CT imaging of the head and neck was performed using the standard protocol during bolus administration of intravenous contrast. Multiplanar CT image reconstructions and MIPs were obtained to evaluate the vascular anatomy. Carotid stenosis measurements (when applicable) are obtained utilizing NASCET criteria, using the distal internal carotid diameter as the denominator. CONTRAST:  33mL OMNIPAQUE IOHEXOL 350 MG/ML SOLN COMPARISON:  CT and MRI same day FINDINGS: CTA NECK FINDINGS Aortic arch: Normal. No atherosclerotic disease. Normal branching pattern without origin stenosis. Right carotid system: Common carotid artery widely patent to the bifurcation. Carotid bifurcation and ICA bulb are normal. Cervical ICA is normal. Left carotid system: Common carotid artery widely patent to the bifurcation. Carotid bifurcation and ICA bulb are normal. Cervical ICA is normal. Vertebral arteries: Both vertebral artery origins widely patent. Both vertebral arteries widely patent and normal through the cervical region to the foramen magnum. Skeleton: Ordinary spondylosis C5-6. Other neck: No soft tissue mass or  lymphadenopathy. Upper chest: Negative Review of the MIP images confirms the above findings CTA HEAD FINDINGS Anterior circulation: Both internal carotid arteries widely patent through the skull base and siphon regions. No siphon atherosclerotic disease or stenosis. The anterior and middle cerebral vessels are patent without proximal stenosis, aneurysm or vascular malformation. No large or medium vessel occlusion. Posterior circulation: Both vertebral arteries widely patent to the basilar. No basilar stenosis. Posterior circulation branch vessels are normal. Venous sinuses: Patent and normal. Anatomic variants: None significant. Review of the MIP images confirms the above findings IMPRESSION: No visible atherosclerotic disease anywhere in the region studied. No intracranial vessel occlusion. No carotid bifurcation stenosis. Electronically Signed   By: Nelson Chimes M.D.   On: 12/18/2019 16:02   MR BRAIN WO CONTRAST  Result Date: 12/18/2019 CLINICAL  DATA:  Stroke, follow-up EXAM: MRI HEAD WITHOUT CONTRAST TECHNIQUE: Multiplanar, multiecho pulse sequences of the brain and surrounding structures were obtained without intravenous contrast. COMPARISON:  Head CT 12/18/2019 FINDINGS: Brain: Cerebral volume is normal for age. There is a punctate focus of diffusion weighted hyperintensity within the anterior left frontal lobe (series 5, image 25). This is too small to accurately character on the ADC map. This may reflect a punctate acute infarct or artifact. No evidence of acute infarct elsewhere within the brain. A few scattered small foci of T2/FLAIR hyperintensity within the cerebral white matter are nonspecific, but may reflect minimal changes of chronic small vessel ischemic disease. 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 No evidence of intracranial mass. No chronic intracranial blood products. No extra-axial fluid collection. No midline shift. Vascular: Expected proximal arterial  flow voids. Skull and upper cervical spine: No focal marrow lesion Sinuses/Orbits: Visualized orbits show no acute finding. Mild ethmoid sinus mucosal thickening. No significant mastoid effusion. IMPRESSION: Punctate acute infarct versus artifact within the anterior left frontal lobe. No evidence of acute infarct elsewhere within the brain. A few small scattered foci of T2 hyperintensity within the cerebral white matter are nonspecific, but may reflect minimal chronic small vessel ischemic changes. 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 Electronically Signed   By: Kellie Simmering DO   On: 12/18/2019 09:04   US Carotid Bilateral (at [facility] and [facility] only)  Result Date: 12/18/2019 CLINICAL DATA:  Stroke-like symptoms EXAM: BILATERAL CAROTID DUPLEX ULTRASOUND TECHNIQUE: Pearline Cables scale imaging, color Doppler and duplex ultrasound were performed of bilateral carotid and vertebral arteries in the neck. COMPARISON:  None. FINDINGS: Criteria: Quantification of carotid stenosis is based on velocity parameters that correlate the residual internal carotid diameter with NASCET-based stenosis levels, using the diameter of the distal internal carotid lumen as the denominator for stenosis measurement. The following velocity measurements were obtained: RIGHT ICA: 75/32 cm/sec CCA: 10/62 cm/sec SYSTOLIC ICA/CCA RATIO:  0.9 ECA:  83 cm/sec LEFT ICA: 110/25 cm/sec CCA: 69/48 cm/sec SYSTOLIC ICA/CCA RATIO:  1.5 ECA:  121 cm/sec RIGHT CAROTID ARTERY: Trace heterogeneous atherosclerotic plaque in the carotid bifurcation without extension into the internal carotid artery. No evidence of internal carotid artery stenosis. RIGHT VERTEBRAL ARTERY:  Patent with normal antegrade flow. LEFT CAROTID ARTERY: 8 no significant atherosclerotic plaque or evidence of stenosis. LEFT VERTEBRAL ARTERY:  Patent with normal antegrade flow. Electronically Signed   By: Jacqulynn Cadet M.D.   On:  12/18/2019 16:04   ECHOCARDIOGRAM COMPLETE BUBBLE STUDY  Result Date: 12/18/2019    ECHOCARDIOGRAM REPORT   Patient Name:   Linda Farley Date of Exam: 12/18/2019 Medical Rec #:  [id number]     Height:       65.0 in Accession #:    [id number]    Weight:       180.0 lb Date of Birth:  [birth year]     BSA:          1.892 m Patient Age:    53 years      BP:           110/66 mmHg Patient Gender: F             HR:           79 bpm. Exam Location:  [facility] Procedure: 2D Echo, Color Doppler, Cardiac Doppler and Saline Contrast Bubble            Study  Indications:     Stroke 434.91  History:         Patient has no prior history of Echocardiogram examinations.                  Risk Factors:Hypertension.  Sonographer:     Sherrie Sport RDCS (AE) Referring Phys:  [place] Diagnosing Phys: Ida Rogue MD IMPRESSIONS  1. Left ventricular ejection fraction, by estimation, is 55 to 60%. The left ventricle has normal function. The left ventricle has no regional wall motion abnormalities. Left ventricular diastolic parameters are consistent with Grade I diastolic dysfunction (impaired relaxation).  2. Right ventricular systolic function is normal. The right ventricular size is normal. There is normal pulmonary artery systolic pressure.  3. The mitral valve is normal in structure. Mild mitral valve regurgitation. No evidence of mitral stenosis.  4. Negative saline contrast bubble study. FINDINGS  Left Ventricle: Left ventricular ejection fraction, by estimation, is 55 to 60%. The left ventricle has normal function. The left ventricle has no regional wall motion abnormalities. The left ventricular internal cavity size was normal in size. There is  no left ventricular hypertrophy. Left ventricular diastolic parameters are consistent with Grade I diastolic dysfunction (impaired relaxation). Right Ventricle: The right ventricular size is normal. No increase in right ventricular wall thickness. Right ventricular systolic function  is normal. There is normal pulmonary artery systolic pressure. The tricuspid regurgitant velocity is 1.85 m/s, and  with an assumed right atrial pressure of 10 mmHg, the estimated right ventricular systolic pressure is 99.3 mmHg. Left Atrium: Left atrial size was normal in size. Right Atrium: Right atrial size was normal in size. Pericardium: There is no evidence of pericardial effusion. Mitral Valve: The mitral valve is normal in structure. Normal mobility of the mitral valve leaflets. Mild mitral valve regurgitation. No evidence of mitral valve stenosis. Tricuspid Valve: The tricuspid valve is normal in structure. Tricuspid valve regurgitation is mild . No evidence of tricuspid stenosis. Aortic Valve: The aortic valve is normal in structure. Aortic valve regurgitation is not visualized. No aortic stenosis is present. Aortic valve mean gradient measures 4.0 mmHg. Aortic valve peak gradient measures 7.4 mmHg. Aortic valve area, by VTI measures 2.08 cm. Pulmonic Valve: The pulmonic valve was normal in structure. Pulmonic valve regurgitation is not visualized. No evidence of pulmonic stenosis. Aorta: The aortic root is normal in size and structure. Venous: The inferior vena cava is normal in size with greater than 50% respiratory variability, suggesting right atrial pressure of 3 mmHg. IAS/Shunts: No atrial level shunt detected by color flow Doppler. Agitated saline contrast was given intravenously to evaluate for intracardiac shunting.  LEFT VENTRICLE PLAX 2D LVIDd:         4.80 cm  Diastology LVIDs:         2.69 cm  LV e' lateral:   10.40 cm/s LV PW:         1.06 cm  LV E/e' lateral: 5.9 LV IVS:        1.11 cm  LV e' medial:    6.74 cm/s LVOT diam:     2.00 cm  LV E/e' medial:  9.1 LV SV:         51 LV SV Index:   27 LVOT Area:     3.14 cm  RIGHT VENTRICLE RV Basal diam:  2.91 cm RV S prime:     13.30 cm/s TAPSE (M-mode): 3.4 cm LEFT ATRIUM  Index       RIGHT ATRIUM           Index LA diam:      3.50 cm  1.85 cm/m  RA Area:     22.10 cm LA Vol (A2C): 36.7 ml 19.40 ml/m RA Volume:   69.00 ml  36.48 ml/m LA Vol (A4C): 54.8 ml 28.97 ml/m  AORTIC VALVE                   PULMONIC VALVE AV Area (Vmax):    1.57 cm    PV Vmax:        0.81 m/s AV Area (Vmean):   1.78 cm    PV Peak grad:   2.6 mmHg AV Area (VTI):     2.08 cm    RVOT Peak grad: 3 mmHg AV Vmax:           136.00 cm/s AV Vmean:          89.000 cm/s AV VTI:            0.246 m AV Peak Grad:      7.4 mmHg AV Mean Grad:      4.0 mmHg LVOT Vmax:         68.00 cm/s LVOT Vmean:        50.300 cm/s LVOT VTI:          0.163 m LVOT/AV VTI ratio: 0.66  AORTA Ao Root diam: 2.60 cm MITRAL VALVE               TRICUSPID VALVE MV Area (PHT): 2.93 cm    TR Peak grad:   13.7 mmHg MV Decel Time: 259 msec    TR Vmax:        185.00 cm/s MV E velocity: 61.60 cm/s MV A velocity: 69.40 cm/s  SHUNTS MV E/A ratio:  0.89        Systemic VTI:  0.16 m                            Systemic Diam: 2.00 cm Ida Rogue MD Electronically signed by Ida Rogue MD Signature Date/Time: 12/18/2019/6:51:30 PM    Final    DG HIP OPERATIVE UNILAT W OR W/O PELVIS RIGHT  Result Date: 12/17/2019 CLINICAL DATA:  Right hip arthroplasty. EXAM: OPERATIVE RIGHT HIP (WITH PELVIS IF PERFORMED) TECHNIQUE: Fluoroscopic spot image(s) were submitted for interpretation post-operatively. COMPARISON:  Preoperative CT. FINDINGS: Two fluoroscopic spot views obtained in the operating room. Right hip arthroplasty is in place. Fluoroscopy time 18 seconds. IMPRESSION: Intraoperative fluoroscopy during right hip arthroplasty. Electronically Signed   By: Keith Rake M.D.   On: 12/17/2019 15:58   DG HIP UNILAT W OR W/O PELVIS 2-3 VIEWS RIGHT  Result Date: 12/17/2019 CLINICAL DATA:  Postop total hip arthroplasty. EXAM: DG HIP (WITH OR WITHOUT PELVIS) 2-3V RIGHT COMPARISON:  None. FINDINGS: Right hip arthroplasty in expected alignment. No periprosthetic lucency or fractures. Recent postsurgical change includes  air and edema in the soft tissues, skin staples and probable wound VAC in place. IMPRESSION: Right hip arthroplasty in expected alignment without immediate postoperative complication. Electronically Signed   By: Keith Rake M.D.   On: 12/17/2019 15:56       LOS: 4 days   [facility] Pager on [email or web address]  12/19/2019, 11:20 AM

## 2019-12-19 NOTE — Evaluation (Signed)
Occupational Therapy Evaluation Patient Details Name: Linda Farley MRN: [id number] DOB: 02-Sep-1950 Today's Date: 12/19/2019    History of Present Illness Patient is a 69 year old female presenting to the ED with acute onset worsening low back pain as well as right hip pain with radiation to right leg with associated right leg weakness/burning without tingling or numbness.  Neurosurgery consulted and recommending conservative management for lumbar degenerative disease, spinal stenosis.  Pt diagnosed with right femoral head fracture and is s/p R THA.  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  MD assessment also includes palpitations, and chronic low back pain with radiculopathy. PMH includes: peripheral neuropathy, depression, and HTN.   Clinical Impression   [name] was seen for OT evaluation this date. Prior to hospital admission, pt was requiring assist for ADLs 2/2 R hip pain. Pt is able to live in late mothers ranch style handicap adapted home c sister and niece available to assist 24/7 and PRN respectively. Pt presents to acute OT demonstrating impaired ADL performance and functional mobility 2/2 decreased LB access, functional strength/balance/ROM deficits, visual impairments, and decreased activity tolerance. Pt currently achieves sup>long sitting MOD I c bed rails. Per PT requires MOD A for ADL t/fs. Independent self-feeding and face washing at bed level. MAX A don L sock, TOTAL A don R sock at bed level. Pt continues to have blurring vision - worse when looking towards L, improves c closing either eye. Pt unable to tolerate visual assesment 2/2 nausea c attempts. Pt would benefit from skilled OT to address noted impairments and functional limitations (see below for any additional details) in order to maximize safety and independence while minimizing falls risk and caregiver burden. Upon hospital discharge, recommend STR to maximize pt  safety and return to PLOF. Pt is agreeable to SNF, however sister at bed side interested in taking pt home c HHOT.     Follow Up Recommendations  SNF    Equipment Recommendations  (If discharge home, recommend hospital bed)   Recommendations for Other Services       Precautions / Restrictions Precautions Precautions: Fall;Anterior Hip Restrictions Weight Bearing Restrictions: Yes RLE Weight Bearing: Weight bearing as tolerated      Mobility Bed Mobility Overal bed mobility: Needs Assistance Bed Mobility: Supine to Sit;Sit to Supine     Supine to sit: Mod assist;HOB elevated     General bed mobility comments: Pt achieves sup>long sitting MOD I c bed rails  Transfers Overall transfer level: Needs assistance Equipment used: 1 person hand held assist Transfers: Sit to/from Stand;Stand Pivot Transfers Sit to Stand: From elevated surface;Mod assist Stand pivot transfers: Mod assist       General transfer comment: Not tested 2/2 pt reporting fatigue and nausea     Balance Overall balance assessment: Needs assistance;History of Falls Sitting-balance support: Feet unsupported;No upper extremity supported Sitting balance-Leahy Scale: Good     Standing balance support: Bilateral upper extremity supported Standing balance-Leahy Scale: Fair Standing balance comment: Mod lean on the RW for support                           ADL either performed or assessed with clinical judgement   ADL Overall ADL's : Needs assistance/impaired  General ADL Comments: Independent self-feeding and face washing at bed level. MAX A don L sock, TOTAL A don R sock.      Vision Baseline Vision/History: Wears glasses Wears Glasses: At all times Patient Visual Report: Blurring of vision Additional Comments: Pt continues to have blurring vision - worse when looking towards L, improves c closing either eye. Pt unable to tolerate  visual assesment 2/2 nausea c attempts      Perception     Praxis      Pertinent Vitals/Pain Pain Assessment: Faces Faces Pain Scale: Hurts a little bit Pain Location: nausea Pain Descriptors / Indicators: Sore;Aching Pain Intervention(s): Limited activity within patient's tolerance     Hand Dominance Right   Extremity/Trunk Assessment Upper Extremity Assessment Upper Extremity Assessment:  (BUE shoulder flexion AROM ~90*)   Lower Extremity Assessment RLE: Unable to fully assess due to pain       Communication Communication Communication: No difficulties   Cognition Arousal/Alertness: Awake/alert Behavior During Therapy: WFL for tasks assessed/performed Overall Cognitive Status: Within Functional Limits for tasks assessed                                     General Comments  Rash visualized on pt's back - NT and RN aware     Exercises Exercises: Other exercises General Exercises - Lower Extremity Ankle Circles/Pumps: 20 reps;Seated;Both Quad Sets: AROM;10 reps;Both Gluteal Sets: AROM;Both;10 reps Other Exercises Other Exercises: Pt educated re: OT role, DME recs, d/c recs, functional application of precautions, visual scanning techniques Other Exercises: Sup>long sitting, LBD, face washing   Shoulder Instructions      Home Living Family/patient expects to be discharged to:: Private residence Living Arrangements: Other relatives (sister) Available Help at Discharge: Family;Available 24 hours/day Type of Home: House Home Access: Ramped entrance     Home Layout: One level     Bathroom Shower/Tub: Occupational psychologist: Handicapped height     Home Equipment: Grab bars - toilet;Wheelchair - [employer] - 4 wheels;Cane - single point;Bedside commode   Additional Comments: Has bilateral grab bars with toilet; pt to live with sister once discharged home for increased caregiver assistance. Able to live in late mothers ranch style  handicap adapted home.      Prior Functioning/Environment Level of Independence: Independent with assistive device(s)  Gait / Transfers Assistance Needed: Patient reports 5-6 falls at home in the last 6 months secondary to "orthostatic hypotension" per sister, tripping, and/or LOB. patient states she ambulates very short distances using 2 single point canes but has been primarily in the bed recently due to hip and back pain (using "pee cup" in bed)              OT Problem List: Decreased strength;Decreased range of motion;Decreased activity tolerance;Impaired balance (sitting and/or standing);Impaired vision/perception;Decreased safety awareness;Decreased knowledge of use of DME or AE      OT Treatment/Interventions: Self-care/ADL training;Therapeutic exercise;Energy conservation;DME and/or AE instruction;Therapeutic activities;Patient/family education;Balance training    OT Goals(Current goals can be found in the care plan section) Acute Rehab OT Goals Patient Stated Goal: To walk better OT Goal Formulation: With patient Time For Goal Achievement: 01/02/20 Potential to Achieve Goals: Fair ADL Goals Pt Will Perform Grooming: with min assist;sitting Pt Will Perform Lower Body Dressing: sitting/lateral leans;with mod assist Pt Will Transfer to Toilet: with min assist;stand pivot transfer;bedside commode (c LRAD PRN) Additional ADL Goal #1: Pt  will Independently verbalize plan to implement x3 falls prevention strategies.  OT Frequency: Min 2X/week   Barriers to D/C: Decreased caregiver support          Co-evaluation              AM-PAC OT "6 Clicks" Daily Activity     Outcome Measure Help from another person eating meals?: None Help from another person taking care of personal grooming?: A Little Help from another person toileting, which includes using toliet, bedpan, or urinal?: A Lot Help from another person bathing (including washing, rinsing, drying)?: A Lot Help from  another person to put on and taking off regular upper body clothing?: A Little Help from another person to put on and taking off regular lower body clothing?: A Lot 6 Click Score: 16   End of Session Nurse Communication:  (Rash )  Activity Tolerance: Patient limited by fatigue;Patient limited by pain Patient left: in bed;with call bell/phone within reach;with family/visitor present  OT Visit Diagnosis: Unsteadiness on feet (R26.81);Other abnormalities of gait and mobility (R26.89)                Time: 8338-2505 OT Time Calculation (min): 31 min Charges:  OT General Charges $OT Visit: 1 Visit OT Evaluation $OT Eval Moderate Complexity: 1 Mod OT Treatments $Self Care/Home Management : 23-37 mins  Dessie Coma, M.S. OTR/L  12/19/19, 2:04 PM

## 2019-12-19 NOTE — Progress Notes (Signed)
Physical Therapy Treatment Patient Details Name: Linda Farley MRN: [id number] DOB: [birth year] Today's Date: 12/19/2019    History of Present Illness Patient is a 69 year old female presenting to the ED with acute onset worsening low back pain as well as right hip pain with radiation to right leg with associated right leg weakness/burning without tingling or numbness.  Neurosurgery consulted and recommending conservative management for lumbar degenerative disease, spinal stenosis.  Pt diagnosed with right femoral head fracture and is s/p R THA.  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  MD assessment also includes palpitations, and chronic low back pain with radiculopathy. PMH includes: peripheral neuropathy, depression, and HTN.    PT Comments    Patient motivated throughout session and able to produce good effort through transfers. Supine > sit modA for RLE negotiation with increased ind with trunk control with HOB elevated. Able to comply with cuing for STS and stand pivot transfer x2 (bed > BSC; BSC > chair) for reaching, and safety, ultimately requiring modA d/t decreased strength and pain with RLE WB. Once on [facility], pt able to comply with cuing for wt shifting to allow for ind toileting. PT reviewed precautions and completed mentioned therex with success/verbal understanding; pt unable to complete heel slides/SLR, add at this time, but educated to continue available therex in recliner. Pt more open to rehab this session, and understands its benefit. Would benefit from skilled PT to address above deficits and promote optimal return to PLOF.   Follow Up Recommendations  SNF     Equipment Recommendations  Rolling walker with 5" wheels    Recommendations for Other Services       Precautions / Restrictions      Mobility  Bed Mobility Overal bed mobility: Needs Assistance Bed Mobility: Supine to Sit;Sit to Supine     Supine to  sit: Mod assist;HOB elevated     General bed mobility comments: modA for RLE negotiation, able to comply with cuing for increased ind with trunk with HOB elevated  Transfers Overall transfer level: Needs assistance Equipment used: 1 person hand held assist Transfers: Sit to/from Stand;Stand Pivot Transfers Sit to Stand: From elevated surface;Mod assist Stand pivot transfers: Mod assist       General transfer comment: STS: ModA for initiation and to maintain balance; Stand Pivot: 2x to [facility] and to chair with modA for transfer and good carry over of cuing for hand reaching  Ambulation/Gait                 Stairs             Wheelchair Mobility    Modified Rankin (Stroke Patients Only)       Balance Overall balance assessment: Needs assistance;History of Falls Sitting-balance support: Feet unsupported;No upper extremity supported Sitting balance-Leahy Scale: Good     Standing balance support: Bilateral upper extremity supported Standing balance-Leahy Scale: Fair Standing balance comment: Mod lean on the RW for support                            Cognition Arousal/Alertness: Awake/alert Behavior During Therapy: WFL for tasks assessed/performed Overall Cognitive Status: Within Functional Limits for tasks assessed  Exercises General Exercises - Lower Extremity Ankle Circles/Pumps: 20 reps;Seated;Both Quad Sets: AROM;10 reps;Both Gluteal Sets: AROM;Both;10 reps Other Exercises Other Exercises: Stnad pivot x2 with cuing for reaching/safety, and modA for transfer; once on [facility] cuing for seated wt shifting with ind toileting accomplished Other Exercises: Review of precautions and exercises to continue in chair    General Comments        Pertinent Vitals/Pain Pain Assessment: Faces Faces Pain Scale: Hurts little more Pain Location: right hip  Pain Descriptors / Indicators: Sore;Aching Pain  Intervention(s): Limited activity within patient's tolerance    Home Living                      Prior Function            PT Goals (current goals can now be found in the care plan section) Acute Rehab PT Goals Patient Stated Goal: To walk better PT Goal Formulation: With patient Time For Goal Achievement: 12/31/19 Potential to Achieve Goals: Fair Progress towards PT goals: Progressing toward goals    Frequency    BID      PT Plan      Co-evaluation              AM-PAC PT "6 Clicks" Mobility   Outcome Measure  Help needed turning from your back to your side while in a flat bed without using bedrails?: A Lot Help needed moving from lying on your back to sitting on the side of a flat bed without using bedrails?: A Lot Help needed moving to and from a bed to a chair (including a wheelchair)?: A Lot Help needed standing up from a chair using your arms (e.g., wheelchair or bedside chair)?: A Little Help needed to walk in hospital room?: Total Help needed climbing 3-5 steps with a railing? : Total 6 Click Score: 11    End of Session Equipment Utilized During Treatment: Gait belt Activity Tolerance: Patient limited by pain Patient left: with call bell/phone within reach;with family/visitor present;with SCD's reapplied;Other (comment);in chair;with chair alarm set Nurse Communication: Mobility status;Weight bearing status PT Visit Diagnosis: Muscle weakness (generalized) (M62.81);Other abnormalities of gait and mobility (R26.89);Pain;History of falling (Z91.81);Other symptoms and signs involving the nervous system (R29.898) Pain - Right/Left: Right Pain - part of body: Hip     Time: 3559-7416 PT Time Calculation (min) (ACUTE ONLY): 33 min  Charges:  $Therapeutic Exercise: 8-22 mins $Therapeutic Activity: 23-37 mins                    Durwin Reges DPT   Durwin Reges 12/19/2019, 10:31 AM

## 2019-12-20 DIAGNOSIS — D649 Anemia, unspecified: Secondary | ICD-10-CM

## 2019-12-20 LAB — CBC
HCT: 30.2 % — ABNORMAL LOW (ref 36.0–46.0)
Hemoglobin: 9.9 g/dL — ABNORMAL LOW (ref 12.0–15.0)
MCH: 30.1 pg (ref 26.0–34.0)
MCHC: 32.8 g/dL (ref 30.0–36.0)
MCV: 91.8 fL (ref 80.0–100.0)
Platelets: 165 10*3/uL (ref 150–400)
RBC: 3.29 MIL/uL — ABNORMAL LOW (ref 3.87–5.11)
RDW: 14.4 % (ref 11.5–15.5)
WBC: 11.5 10*3/uL — ABNORMAL HIGH (ref 4.0–10.5)
nRBC: 0 % (ref 0.0–0.2)

## 2019-12-20 LAB — BASIC METABOLIC PANEL
Anion gap: 6 (ref 5–15)
BUN: 23 mg/dL (ref 8–23)
CO2: 27 mmol/L (ref 22–32)
Calcium: 7.9 mg/dL — ABNORMAL LOW (ref 8.9–10.3)
Chloride: 105 mmol/L (ref 98–111)
Creatinine, Ser: 0.91 mg/dL (ref 0.44–1.00)
GFR calc Af Amer: >60 mL/min (ref >60)
GFR calc non Af Amer: >60 mL/min (ref >60)
Glucose, Bld: 106 mg/dL — ABNORMAL HIGH (ref 70–99)
Potassium: 4 mmol/L (ref 3.5–5.1)
Sodium: 138 mmol/L (ref 135–145)

## 2019-12-20 MED ORDER — TRIAMCINOLONE ACETONIDE 0.1 % EX CREA
TOPICAL_CREAM | Freq: Three times a day (TID) | CUTANEOUS | Status: DC
  Filled 2019-12-20: qty 15

## 2019-12-20 NOTE — Plan of Care (Signed)
  Problem: Education: Goal: Knowledge of General Education information will improve Description Including pain rating scale, medication(s)/side effects and non-pharmacologic comfort measures Outcome: Progressing   

## 2019-12-20 NOTE — TOC Progression Note (Addendum)
Transition of Care [facility]) - Progression Note    Patient Details  Name: Linda Farley MRN: [id number] Date of Birth: Dec 16, 1950  [facility] CM/SW Contact  Boris Sharper, LCSW Phone Number: 12/20/2019, 3:28 PM  Clinical Narrative:    CSW called pt's room to notify of PT recommendations and [name] answered the phone. Pt's sister stated that the pt was sleep and they are not sure if they want to go with [facility] or SNF at this time. CSW notified pt's sister that FL2 could  be completed and ready to send out once they made a decision. Pt's sister was agreeable.  If SNf pt's sister requested [facility] in [place]. Notified pt's sister of the difficulty of getting into retirement communities that pt's aren't already established with, she was understanding. CSW will follow up with pt and notify once she is awake. [name] can be reached at [phone] or [phone].  TOC will continue to follow.   Expected Discharge Plan: Home/Self Care Barriers to Discharge: Continued Medical Work up  Expected Discharge Plan and Services Expected Discharge Plan: Home/Self Care In-house Referral: Clinical Social Work   Post Acute Care Choice: Durable Medical Equipment Living arrangements for the past 2 months: Single Family Home                 DME Arranged: Walker rolling DME Agency: [employer] Date DME Agency Contacted: 12/15/19 Time DME Agency Contacted: [phone] Representative spoke with at DME Agency: Zack             Social Determinants of Health ([place]) Interventions    Readmission Risk Interventions No flowsheet data found.

## 2019-12-20 NOTE — NC FL2 (Signed)
[place] LEVEL OF CARE SCREENING TOOL     IDENTIFICATION  Patient Name: Linda Farley Birthdate: 02/09/1951 Sex: female Admission Date (Current Location): 12/14/2019  [place] and [place] Number:  Engineering geologist and Address:  [facility], [address] [place], [place] [address]      Provider Number:    Attending Physician Name and Address:  Bonnielee Haff, MD  Relative Name and Phone Number:  Belenda Cruise  [phone]    Current Level of Care: Hospital Recommended Level of Care: Center Prior Approval Number:    Date Approved/Denied:   PASRR Number: [id number]  Discharge Plan: SNF    Current Diagnoses: Patient Active Problem List   Diagnosis Date Noted  . Intractable low back pain 12/15/2019  . Positive colorectal cancer screening using Cologuard test   . Polyp of transverse colon   . Back pain 09/17/2019  . Prediabetes 09/04/2019  . Fatigue 08/04/2019  . Snoring 08/04/2019  . Reactive depression 03/11/2019  . Essential hypertension 03/11/2019  . DDD (degenerative disc disease), lumbar 04/07/2014  . Lumbar radiculitis 04/07/2014  . Lumbar stenosis with neurogenic claudication 04/07/2014    Orientation RESPIRATION BLADDER Height & Weight     Self, Time, Situation, Place  Normal Continent Weight: 180 lb (81.6 kg) Height:  5\' 5"  (165.1 cm)  BEHAVIORAL SYMPTOMS/MOOD NEUROLOGICAL BOWEL NUTRITION STATUS      Continent Diet (regular)  AMBULATORY STATUS COMMUNICATION OF NEEDS Skin   Extensive Assist Verbally Surgical wounds (right hip replacement)                       Personal Care Assistance Level of Assistance  Bathing, Feeding, Dressing Bathing Assistance: Limited assistance Feeding assistance: Limited assistance Dressing Assistance: Limited assistance     Functional Limitations Info  Sight, Speech, Hearing Sight Info: Adequate Hearing Info: Adequate Speech Info: Adequate     SPECIAL CARE FACTORS FREQUENCY  PT (By licensed PT), OT (By licensed OT)     PT Frequency: 5x week OT Frequency: 5x week            Contractures Contractures Info: Not present    Additional Factors Info  Code Status Code Status Info: Full             Current Medications (12/20/2019):  This is the current hospital active medication list Current Facility-Administered Medications  Medication Dose Route Frequency Provider Last Rate Last Admin  . acetaminophen (TYLENOL) tablet 650 mg  650 mg Oral Q6H PRN Hessie Knows, MD   650 mg at 12/19/19 0225   Or  . acetaminophen (TYLENOL) suppository 650 mg  650 mg Rectal Q6H PRN Hessie Knows, MD      . albuterol (PROVENTIL) (2.5 MG/3ML) 0.083% nebulizer solution 3 mL  3 mL Inhalation Q4H PRN Hessie Knows, MD      . alum & mag hydroxide-simeth (MAALOX/MYLANTA) 200-200-20 MG/5ML suspension 30 mL  30 mL Oral Q4H PRN Hessie Knows, MD      . aspirin suppository 300 mg  300 mg Rectal Daily Bonnielee Haff, MD       Or  . aspirin tablet 325 mg  325 mg Oral Daily Bonnielee Haff, MD   325 mg at 12/20/19 0852  . atorvastatin (LIPITOR) tablet 20 mg  20 mg Oral Daily Bonnielee Haff, MD   20 mg at 12/20/19 0852  . bisacodyl (DULCOLAX) suppository 10 mg  10 mg Rectal Daily PRN Hessie Knows, MD      .  camphor-menthol (SARNA) lotion   Topical PRN Bonnielee Haff, MD   Given at 12/19/19 1611  . citalopram (CELEXA) tablet 20 mg  20 mg Oral Daily Hessie Knows, MD   20 mg at 12/20/19 0852  . diphenhydrAMINE (BENADRYL) 12.5 MG/5ML elixir 12.5-25 mg  12.5-25 mg Oral Q4H PRN Hessie Knows, MD   25 mg at 12/20/19 0451  . docusate sodium (COLACE) capsule 100 mg  100 mg Oral BID Hessie Knows, MD   100 mg at 12/20/19 0852  . enoxaparin (LOVENOX) injection 40 mg  40 mg Subcutaneous Q24H Hessie Knows, MD   40 mg at 12/20/19 0851  . gabapentin (NEURONTIN) capsule 300 mg  300 mg Oral Daily Hessie Knows, MD   300 mg at 12/20/19 0851  . gabapentin  (NEURONTIN) capsule 600 mg  600 mg Oral QHS Hessie Knows, MD   600 mg at 12/19/19 2121  . HYDROcodone-acetaminophen (NORCO) 7.5-325 MG per tablet 1 tablet  1 tablet Oral Q4H PRN Hessie Knows, MD   1 tablet at 12/20/19 0852  . lisinopril (ZESTRIL) tablet 20 mg  20 mg Oral Daily Hessie Knows, MD   20 mg at 12/20/19 0852  . magnesium citrate solution 1 Bottle  1 Bottle Oral Once PRN Hessie Knows, MD      . magnesium hydroxide (MILK OF MAGNESIA) suspension 30 mL  30 mL Oral Daily PRN Hessie Knows, MD   30 mL at 12/16/19 1106  . magnesium hydroxide (MILK OF MAGNESIA) suspension 30 mL  30 mL Oral Daily PRN Hessie Knows, MD      . magnesium oxide (MAG-OX) tablet 400 mg  400 mg Oral Daily Hessie Knows, MD   400 mg at 12/20/19 0852  . menthol-cetylpyridinium (CEPACOL) lozenge 3 mg  1 lozenge Oral PRN Hessie Knows, MD       Or  . phenol (CHLORASEPTIC) mouth spray 1 spray  1 spray Mouth/Throat PRN Hessie Knows, MD      . metoCLOPramide (REGLAN) tablet 5-10 mg  5-10 mg Oral Q8H PRN Hessie Knows, MD       Or  . metoCLOPramide (REGLAN) injection 5-10 mg  5-10 mg Intravenous Q8H PRN Hessie Knows, MD   10 mg at 12/18/19 1428  . morphine 2 MG/ML injection 2 mg  2 mg Intravenous Q4H PRN Hessie Knows, MD      . ondansetron [facility]) tablet 4 mg  4 mg Oral Q6H PRN Hessie Knows, MD       Or  . ondansetron [facility]) injection 4 mg  4 mg Intravenous Q6H PRN Hessie Knows, MD   4 mg at 12/19/19 1952  . ondansetron (ZOFRAN) tablet 4 mg  4 mg Oral Q6H PRN Hessie Knows, MD       Or  . ondansetron [facility]) injection 4 mg  4 mg Intravenous Q6H PRN Hessie Knows, MD      . polyethylene glycol (MIRALAX / GLYCOLAX) packet 17 g  17 g Oral Daily Hessie Knows, MD   17 g at 12/20/19 0851  . sodium phosphate (FLEET) 7-19 GM/118ML enema 1 enema  1 enema Rectal Daily PRN Hessie Knows, MD      . traMADol Veat[facility] Bourbon) tablet 50 mg  50 mg Oral TID PRN Hessie Knows, MD   50 mg at 12/15/19 0205  . traZODone (DESYREL) tablet  25 mg  25 mg Oral QHS PRN Hessie Knows, MD   25 mg at 12/16/19 2106  . triamcinolone cream (KENALOG) 0.1 %   Topical TID Bonnielee Haff, MD  Discharge Medications: Please see discharge summary for a list of discharge medications.  Relevant Imaging Results:  Relevant Lab Results:   Additional Information [id number]  Boris Sharper, LCSW

## 2019-12-20 NOTE — Progress Notes (Signed)
[facility] PROGRESS NOTE   Linda LEFEVERS BZJ:[id number] DOB: 20-Mar-1951 DOA: 12/14/2019  PCP: [name], FNP  Brief History/Interval Summary: 69 y.o. Caucasian female with a known history of hypertension, depression, spinal stenosis with chronic low back pain, who presented to the emergency room with acute onset of worsening low back pain which has been intractable as well as right hip pain with radiation to her right leg with associated right leg weakness and burning without tingling or numbness.  She has an appointment for discussion about right hip arthroplasty with Dr. [name] in [facility] coming soon.  Patient was unable to weight-bear and ambulate.  She was hospitalized.  Neurosurgery as well as orthopedic surgery was consulted.  Reason for Visit: Significant pain in the right hip with inability to bear weight.  Consultants: Neurosurgery.  Orthopedics.  Procedures: None yet  Antibiotics: Anti-infectives (From admission, onward)   Start     Dose/Rate Route Frequency Ordered Stop   12/17/19 1930  ceFAZolin (ANCEF) IVPB 2g/100 mL premix        2 g 200 mL/hr over 30 Minutes Intravenous Every 6 hours 12/17/19 1715 12/18/19 0700   12/17/19 1200  ceFAZolin (ANCEF) IVPB 2g/100 mL premix        2 g 200 mL/hr over 30 Minutes Intravenous  Once 12/16/19 0745 12/17/19 1345      Subjective/Interval History: Patient complains of an itchy rash in her upper back.  Vision appears to be stable for the most part.  Pain in the right leg is reasonably well controlled.  No further fever.  Denies any dysuria or diarrhea shortness of breath or cough.     Assessment/Plan:  Intractable right hip pain with inability to bear weight and ambulate Patient known to have arthritis.  She was supposed to see orthopedics in the outpatient setting to discuss hip joint replacement.  She is unable to bear weight on the right side.  Examination did not suggest any fractures.  She did have a CT  scan which does not show any fractures in that area.  Patient seen by orthopedics.  Patient underwent total hip replacement on 7/8.   Seems to be stable from an orthopedic standpoint.  Orthopedics is following.  Acute stroke Patient developed double vision early morning on 7/9.  MRI brain shows evidence for acute infarct in the left lobe.  Neurology was consulted.  Stroke work-up was initiated.  Patient currently on aspirin.  Echocardiogram does not show any acute findings.  Normal systolic function.  Bubble study was negative.  CT angiogram of the head and neck does not show any significant stenosis. LDL is noted to be 82.  Started on atorvastatin.  HbA1c was 5.9. Further management per neurology but it does not look like there is any further work-up planned.  PT and OT evaluation.  Fever She was noted to be febrile on 7/10.  Infectious work-up has been negative including chest x-ray and UA.  Fever appears to have subsided.  Could be postprocedural.  WBC is mildly elevated.  Continue to monitor for now.  No antibiotics for now.    Palpitations EKG was repeated and shows heart rate to be 90.  Similar to EKG done at the time of admission.  Appears to have resolved.  Normocytic anemia/acute blood loss anemia Drop in hemoglobin likely due to surgery.  No other overt bleeding noted.  Continue to monitor.  Chronic low back pain with radiculopathy Seen by neurosurgery.  MRI of the lumbar  spine was done which does not show any acute findings.  She was given intravenous dexamethasone for a few days.  Her back pain appears to be stable.  No plans for any surgical intervention currently.  Follow-up with her physiatrist.   Essential hypertension Blood pressure is reasonably well controlled.  On lisinopril/HCTZ at home.  Only lisinopril was continued.  Holding for permissive hypertension due to stroke.  Peripheral neuropathy Continue with gabapentin.  History of depression Continue with citalopram    DVT Prophylaxis: Lovenox Code Status: Full code Family Communication: Discussed with the patient Disposition Plan:  Status is: Inpatient  Remains inpatient appropriate because:Ongoing active pain requiring inpatient pain management   Dispo:  Patient From: Home  Planned Disposition: [place]  Expected discharge date: 12/21/19  Medically stable for discharge: No       Medications:  Scheduled: . aspirin  300 mg Rectal Daily   Or  . aspirin  325 mg Oral Daily  . atorvastatin  20 mg Oral Daily  . citalopram  20 mg Oral Daily  . docusate sodium  100 mg Oral BID  . enoxaparin (LOVENOX) injection  40 mg Subcutaneous Q24H  . gabapentin  300 mg Oral Daily  . gabapentin  600 mg Oral QHS  . lisinopril  20 mg Oral Daily  . magnesium oxide  400 mg Oral Daily  . polyethylene glycol  17 g Oral Daily  . triamcinolone cream   Topical TID   Continuous: . sodium chloride Stopped (12/18/19 1247)  . lactated ringers 10 mL/hr at 12/17/19 1255   SHF:WYOVZCHYIFOYD **OR** acetaminophen, albuterol, alum & mag hydroxide-simeth, bisacodyl, camphor-menthol, diphenhydrAMINE, HYDROcodone-acetaminophen, magnesium citrate, magnesium hydroxide, magnesium hydroxide, menthol-cetylpyridinium **OR** phenol, metoCLOPramide **OR** metoCLOPramide (REGLAN) injection, morphine injection, ondansetron **OR** ondansetron (ZOFRAN) IV, ondansetron **OR** ondansetron (ZOFRAN) IV, sodium phosphate, traMADol, traZODone   Objective:  Vital Signs  Vitals:   12/19/19 1959 12/19/19 2324 12/20/19 0349 12/20/19 0759  BP: 124/66 113/79 130/66 118/67  Pulse: 79 93 90 83  Resp: 16 16 16 16   Temp: 98.5 F (36.9 C) 99.5 F (37.5 C) 99.9 F (37.7 C) 99.4 F (37.4 C)  TempSrc: Oral Oral Oral Oral  SpO2: 99% 95% 95% 93%  Weight:      Height:        Intake/Output Summary (Last 24 hours) at 12/20/2019 1026 Last data filed at 12/20/2019 0452 Gross per 24 hour  Intake 240 ml  Output 600 ml  Net -360 ml    Filed Weights   12/14/19 1710  Weight: 81.6 kg    General appearance: Awake alert.  In no distress Resp: Clear to auscultation bilaterally.  Normal effort Cardio: S1-S2 is normal regular.  No S3-S4.  No rubs murmurs or bruit GI: Abdomen is soft.  Nontender nondistended.  Bowel sounds are present normal.  No masses organomegaly Extremities: Has difficulty moving the right leg due to recent surgery. Skin: Patient noted to have mildly erythematous maculopapular rash in the upper back. Neurologic: Alert and oriented x3.  No focal neurological deficits.    Lab Results:  Data Reviewed: I have personally reviewed following labs and imaging studies  CBC: Recent Labs  Lab 12/15/19 0507 12/17/19 0626 12/18/19 0429 12/19/19 0552 12/20/19 0416  WBC 9.2 10.5 9.8 11.6* 11.5*  HGB 12.5 12.7 10.7* 10.3* 9.9*  HCT 37.5 38.4 32.3* 31.6* 30.2*  MCV 91.0 91.0 92.0 92.1 91.8  PLT 218 242 182 175 741    Basic Metabolic Panel: Recent Labs  Lab 12/15/19  0507 12/17/19 0626 12/18/19 0429 12/19/19 0552 12/20/19 0416  NA 141 141 141 141 138  K 4.3 4.7 3.7 4.1 4.0  CL 103 105 106 107 105  CO2 28 28 28 29 27   GLUCOSE 116* 131* 96 106* 106*  BUN 28* 25* 26* 22 23  CREATININE 0.93 0.93 1.02* 0.85 0.91  CALCIUM 9.0 8.9 7.9* 7.7* 7.9*    GFR: Estimated Creatinine Clearance: 61.5 mL/min (by C-G formula based on SCr of 0.91 mg/dL).  Liver Function Tests: Recent Labs  Lab 12/14/19 1711  AST 17  ALT 12  ALKPHOS 87  BILITOT 0.9  PROT 6.8  ALBUMIN 3.9     Recent Results (from the past 240 hour(s))  SARS Coronavirus 2 by RT PCR (hospital order, performed in [facility] hospital lab) Nasopharyngeal Nasopharyngeal Swab     Status: None   Collection Time: 12/15/19 12:35 AM   Specimen: Nasopharyngeal Swab  Result Value Ref Range Status   SARS Coronavirus 2 NEGATIVE NEGATIVE Final    Comment: (NOTE) SARS-CoV-2 target nucleic acids are NOT DETECTED.  The SARS-CoV-2 RNA is generally  detectable in upper and lower respiratory specimens during the acute phase of infection. The lowest concentration of SARS-CoV-2 viral copies this assay can detect is 250 copies / mL. A negative result does not preclude SARS-CoV-2 infection and should not be used as the sole basis for treatment or other patient management decisions.  A negative result may occur with improper specimen collection / handling, submission of specimen other than nasopharyngeal swab, presence of viral mutation(s) within the areas targeted by this assay, and inadequate number of viral copies (<250 copies / mL). A negative result must be combined with clinical observations, patient history, and epidemiological information.  Fact Sheet for Patients:   StrictlyIdeas.no  Fact Sheet for Healthcare Providers: BankingDealers.co.za  This test is not yet approved or  cleared by the [place] FDA and has been authorized for detection and/or diagnosis of SARS-CoV-2 by FDA under an Emergency Use Authorization (EUA).  This EUA will remain in effect (meaning this test can be used) for the duration of the COVID-19 declaration under Section 564(b)(1) of the Act, 21 U.S.C. section 360bbb-3(b)(1), unless the authorization is terminated or revoked sooner.  Performed at [facility], [address] [place], [place] [address]   Surgical pcr screen     Status: None   Collection Time: 12/16/19  3:57 PM   Specimen: Nasal Mucosa; Nasal Swab  Result Value Ref Range Status   MRSA, PCR NEGATIVE NEGATIVE Final   Staphylococcus aureus NEGATIVE NEGATIVE Final    Comment: (NOTE) The Xpert SA Assay (FDA approved for NASAL specimens in patients 49 years of age and older), is one component of a comprehensive surveillance program. It is not intended to diagnose infection nor to guide or monitor treatment. Performed at [facility], [address] [place],   [address]       Radiology Studies: CT ANGIO HEAD W OR WO CONTRAST  Result Date: 12/18/2019 CLINICAL DATA:  Acute onset of diplopia and headache today. Small acute infarction question in the left frontal lobe. EXAM: CT ANGIOGRAPHY HEAD AND NECK TECHNIQUE: Multidetector CT imaging of the head and neck was performed using the standard protocol during bolus administration of intravenous contrast. Multiplanar CT image reconstructions and MIPs were obtained to evaluate the vascular anatomy. Carotid stenosis measurements (when applicable) are obtained utilizing NASCET criteria, using the distal internal carotid diameter as the denominator. CONTRAST:  49mL OMNIPAQUE  IOHEXOL 350 MG/ML SOLN COMPARISON:  CT and MRI same day FINDINGS: CTA NECK FINDINGS Aortic arch: Normal. No atherosclerotic disease. Normal branching pattern without origin stenosis. Right carotid system: Common carotid artery widely patent to the bifurcation. Carotid bifurcation and ICA bulb are normal. Cervical ICA is normal. Left carotid system: Common carotid artery widely patent to the bifurcation. Carotid bifurcation and ICA bulb are normal. Cervical ICA is normal. Vertebral arteries: Both vertebral artery origins widely patent. Both vertebral arteries widely patent and normal through the cervical region to the foramen magnum. Skeleton: Ordinary spondylosis C5-6. Other neck: No soft tissue mass or lymphadenopathy. Upper chest: Negative Review of the MIP images confirms the above findings CTA HEAD FINDINGS Anterior circulation: Both internal carotid arteries widely patent through the skull base and siphon regions. No siphon atherosclerotic disease or stenosis. The anterior and middle cerebral vessels are patent without proximal stenosis, aneurysm or vascular malformation. No large or medium vessel occlusion. Posterior circulation: Both vertebral arteries widely patent to the basilar. No basilar stenosis. Posterior circulation branch vessels are  normal. Venous sinuses: Patent and normal. Anatomic variants: None significant. Review of the MIP images confirms the above findings IMPRESSION: No visible atherosclerotic disease anywhere in the region studied. No intracranial vessel occlusion. No carotid bifurcation stenosis. Electronically Signed   By: Nelson Chimes M.D.   On: 12/18/2019 16:02   CT ANGIO NECK W OR WO CONTRAST  Result Date: 12/18/2019 CLINICAL DATA:  Acute onset of diplopia and headache today. Small acute infarction question in the left frontal lobe. EXAM: CT ANGIOGRAPHY HEAD AND NECK TECHNIQUE: Multidetector CT imaging of the head and neck was performed using the standard protocol during bolus administration of intravenous contrast. Multiplanar CT image reconstructions and MIPs were obtained to evaluate the vascular anatomy. Carotid stenosis measurements (when applicable) are obtained utilizing NASCET criteria, using the distal internal carotid diameter as the denominator. CONTRAST:  102mL OMNIPAQUE IOHEXOL 350 MG/ML SOLN COMPARISON:  CT and MRI same day FINDINGS: CTA NECK FINDINGS Aortic arch: Normal. No atherosclerotic disease. Normal branching pattern without origin stenosis. Right carotid system: Common carotid artery widely patent to the bifurcation. Carotid bifurcation and ICA bulb are normal. Cervical ICA is normal. Left carotid system: Common carotid artery widely patent to the bifurcation. Carotid bifurcation and ICA bulb are normal. Cervical ICA is normal. Vertebral arteries: Both vertebral artery origins widely patent. Both vertebral arteries widely patent and normal through the cervical region to the foramen magnum. Skeleton: Ordinary spondylosis C5-6. Other neck: No soft tissue mass or lymphadenopathy. Upper chest: Negative Review of the MIP images confirms the above findings CTA HEAD FINDINGS Anterior circulation: Both internal carotid arteries widely patent through the skull base and siphon regions. No siphon atherosclerotic  disease or stenosis. The anterior and middle cerebral vessels are patent without proximal stenosis, aneurysm or vascular malformation. No large or medium vessel occlusion. Posterior circulation: Both vertebral arteries widely patent to the basilar. No basilar stenosis. Posterior circulation branch vessels are normal. Venous sinuses: Patent and normal. Anatomic variants: None significant. Review of the MIP images confirms the above findings IMPRESSION: No visible atherosclerotic disease anywhere in the region studied. No intracranial vessel occlusion. No carotid bifurcation stenosis. Electronically Signed   By: Nelson Chimes M.D.   On: 12/18/2019 16:02   US Carotid Bilateral (at [facility] and [facility] only)  Result Date: 12/18/2019 CLINICAL DATA:  Stroke-like symptoms EXAM: BILATERAL CAROTID DUPLEX ULTRASOUND TECHNIQUE: Pearline Cables scale imaging, color Doppler and duplex ultrasound were performed of bilateral carotid and  vertebral arteries in the neck. COMPARISON:  None. FINDINGS: Criteria: Quantification of carotid stenosis is based on velocity parameters that correlate the residual internal carotid diameter with NASCET-based stenosis levels, using the diameter of the distal internal carotid lumen as the denominator for stenosis measurement. The following velocity measurements were obtained: RIGHT ICA: 75/32 cm/sec CCA: 01/02 cm/sec SYSTOLIC ICA/CCA RATIO:  0.9 ECA:  83 cm/sec LEFT ICA: 110/25 cm/sec CCA: 72/53 cm/sec SYSTOLIC ICA/CCA RATIO:  1.5 ECA:  121 cm/sec RIGHT CAROTID ARTERY: Trace heterogeneous atherosclerotic plaque in the carotid bifurcation without extension into the internal carotid artery. No evidence of internal carotid artery stenosis. RIGHT VERTEBRAL ARTERY:  Patent with normal antegrade flow. LEFT CAROTID ARTERY: 8 no significant atherosclerotic plaque or evidence of stenosis. LEFT VERTEBRAL ARTERY:  Patent with normal antegrade flow. Electronically Signed   By: Jacqulynn Cadet M.D.   On: 12/18/2019 16:04    DG Chest Port 1 View  Result Date: 12/19/2019 CLINICAL DATA:  Fever.  Recent total hip arthroplasty EXAM: PORTABLE CHEST 1 VIEW COMPARISON:  December 14, 2019 FINDINGS: Lungs are clear. Heart size and pulmonary vascularity are normal. No adenopathy. There is extensive arthropathy in each shoulder. IMPRESSION: Lungs clear. Cardiac silhouette within normal limits. No evident adenopathy. Electronically Signed   By: Lowella Grip III M.D.   On: 12/19/2019 13:38   ECHOCARDIOGRAM COMPLETE BUBBLE STUDY  Result Date: 12/18/2019    ECHOCARDIOGRAM REPORT   Patient Name:   Linda Farley Date of Exam: 12/18/2019 Medical Rec #:  [id number]     Height:       65.0 in Accession #:    [id number]    Weight:       180.0 lb Date of Birth:  26-Oct-1950     BSA:          1.892 m Patient Age:    10 years      BP:           110/66 mmHg Patient Gender: F             HR:           79 bpm. Exam Location:  [facility] Procedure: 2D Echo, Color Doppler, Cardiac Doppler and Saline Contrast Bubble            Study Indications:     Stroke 434.91  History:         Patient has no prior history of Echocardiogram examinations.                  Risk Factors:Hypertension.  Sonographer:     Sherrie Sport RDCS (AE) Referring Phys:  [place] Diagnosing Phys: Ida Rogue MD IMPRESSIONS  1. Left ventricular ejection fraction, by estimation, is 55 to 60%. The left ventricle has normal function. The left ventricle has no regional wall motion abnormalities. Left ventricular diastolic parameters are consistent with Grade I diastolic dysfunction (impaired relaxation).  2. Right ventricular systolic function is normal. The right ventricular size is normal. There is normal pulmonary artery systolic pressure.  3. The mitral valve is normal in structure. Mild mitral valve regurgitation. No evidence of mitral stenosis.  4. Negative saline contrast bubble study. FINDINGS  Left Ventricle: Left ventricular ejection fraction, by estimation, is 55 to 60%. The left  ventricle has normal function. The left ventricle has no regional wall motion abnormalities. The left ventricular internal cavity size was normal in size. There is  no left ventricular hypertrophy. Left ventricular diastolic parameters are consistent with  Grade I diastolic dysfunction (impaired relaxation). Right Ventricle: The right ventricular size is normal. No increase in right ventricular wall thickness. Right ventricular systolic function is normal. There is normal pulmonary artery systolic pressure. The tricuspid regurgitant velocity is 1.85 m/s, and  with an assumed right atrial pressure of 10 mmHg, the estimated right ventricular systolic pressure is 09.3 mmHg. Left Atrium: Left atrial size was normal in size. Right Atrium: Right atrial size was normal in size. Pericardium: There is no evidence of pericardial effusion. Mitral Valve: The mitral valve is normal in structure. Normal mobility of the mitral valve leaflets. Mild mitral valve regurgitation. No evidence of mitral valve stenosis. Tricuspid Valve: The tricuspid valve is normal in structure. Tricuspid valve regurgitation is mild . No evidence of tricuspid stenosis. Aortic Valve: The aortic valve is normal in structure. Aortic valve regurgitation is not visualized. No aortic stenosis is present. Aortic valve mean gradient measures 4.0 mmHg. Aortic valve peak gradient measures 7.4 mmHg. Aortic valve area, by VTI measures 2.08 cm. Pulmonic Valve: The pulmonic valve was normal in structure. Pulmonic valve regurgitation is not visualized. No evidence of pulmonic stenosis. Aorta: The aortic root is normal in size and structure. Venous: The inferior vena cava is normal in size with greater than 50% respiratory variability, suggesting right atrial pressure of 3 mmHg. IAS/Shunts: No atrial level shunt detected by color flow Doppler. Agitated saline contrast was given intravenously to evaluate for intracardiac shunting.  LEFT VENTRICLE PLAX 2D LVIDd:          4.80 cm  Diastology LVIDs:         2.69 cm  LV e' lateral:   10.40 cm/s LV PW:         1.06 cm  LV E/e' lateral: 5.9 LV IVS:        1.11 cm  LV e' medial:    6.74 cm/s LVOT diam:     2.00 cm  LV E/e' medial:  9.1 LV SV:         51 LV SV Index:   27 LVOT Area:     3.14 cm  RIGHT VENTRICLE RV Basal diam:  2.91 cm RV S prime:     13.30 cm/s TAPSE (M-mode): 3.4 cm LEFT ATRIUM           Index       RIGHT ATRIUM           Index LA diam:      3.50 cm 1.85 cm/m  RA Area:     22.10 cm LA Vol (A2C): 36.7 ml 19.40 ml/m RA Volume:   69.00 ml  36.48 ml/m LA Vol (A4C): 54.8 ml 28.97 ml/m  AORTIC VALVE                   PULMONIC VALVE AV Area (Vmax):    1.57 cm    PV Vmax:        0.81 m/s AV Area (Vmean):   1.78 cm    PV Peak grad:   2.6 mmHg AV Area (VTI):     2.08 cm    RVOT Peak grad: 3 mmHg AV Vmax:           136.00 cm/s AV Vmean:          89.000 cm/s AV VTI:            0.246 m AV Peak Grad:      7.4 mmHg AV Mean Grad:      4.0 mmHg  LVOT Vmax:         68.00 cm/s LVOT Vmean:        50.300 cm/s LVOT VTI:          0.163 m LVOT/AV VTI ratio: 0.66  AORTA Ao Root diam: 2.60 cm MITRAL VALVE               TRICUSPID VALVE MV Area (PHT): 2.93 cm    TR Peak grad:   13.7 mmHg MV Decel Time: 259 msec    TR Vmax:        185.00 cm/s MV E velocity: 61.60 cm/s MV A velocity: 69.40 cm/s  SHUNTS MV E/A ratio:  0.89        Systemic VTI:  0.16 m                            Systemic Diam: 2.00 cm Ida Rogue MD Electronically signed by Ida Rogue MD Signature Date/Time: 12/18/2019/6:51:30 PM    Final        LOS: 5 days   [facility] Pager on [email or web address]  12/20/2019, 10:26 AM

## 2019-12-20 NOTE — Progress Notes (Addendum)
Physical Therapy Treatment Patient Details Name: Linda Farley MRN: [id number] DOB: 06-Nov-1950 Today's Date: 12/20/2019    History of Present Illness Patient is a 69 year old female presenting to the ED with acute onset worsening low back pain as well as right hip pain with radiation to right leg with associated right leg weakness/burning without tingling or numbness.  Neurosurgery consulted and recommending conservative management for lumbar degenerative disease, spinal stenosis.  Pt diagnosed with right femoral head fracture and is s/p R THA.  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  MD assessment also includes palpitations, and chronic low back pain with radiculopathy. PMH includes: peripheral neuropathy, depression, and HTN.    PT Comments    Participated in exercises as described below.  To EOB with increased time and min a x 1 for LE.  Unable to move on her own today.  Sitting steady.  She is able to stand with min a x 1 to walker and pivot to bedside commode to void.  Ind self care.  She stands and is able to complete 180 degree turn to recliner but remains unable to take a step and relies on sliding feet along the floor.  Remained in recliner after session.    Discussed at length discharge plan.  She voices pressure from her family to return home vs SNF.  She is aware SNF is more appropriate and she will be pushed harder and receive more therapy which she wants.  Discussed ways to approach with family including discussing risks/benefits.  Encouraged pt to have family discuss with MD or therapy if they have further questions regarding discharge plan.  SNF remains appropriate for pt..   Follow Up Recommendations  SNF     Equipment Recommendations  Rolling walker with 5" wheels    Recommendations for Other Services       Precautions / Restrictions WBAT RLE   Mobility  Bed Mobility Overal bed mobility: Needs  Assistance Bed Mobility: Supine to Sit     Supine to sit: HOB elevated;Min assist     General bed mobility comments: assist for RLE this am  Transfers Overall transfer level: Needs assistance Equipment used: Rolling walker (2 wheeled) Transfers: Sit to/from Stand Sit to Stand: Min assist            Ambulation/Gait Ambulation/Gait assistance: Min Web designer (Feet): 3 Feet Assistive device: Rolling walker (2 wheeled) Gait Pattern/deviations: Decreased step length - right;Decreased step length - left;Trunk flexed Gait velocity: decreased   General Gait Details: unable to take actual steps today, more pivots/slides feet on floor.   Stairs             Wheelchair Mobility    Modified Rankin (Stroke Patients Only)       Balance Overall balance assessment: Needs assistance;History of Falls Sitting-balance support: Feet unsupported;No upper extremity supported Sitting balance-Leahy Scale: Good     Standing balance support: Bilateral upper extremity supported Standing balance-Leahy Scale: Fair Standing balance comment: Mod lean on the RW for support                            Cognition Arousal/Alertness: Awake/alert Behavior During Therapy: WFL for tasks assessed/performed Overall Cognitive Status: Within Functional Limits for tasks assessed  Exercises Other Exercises Other Exercises: ankle pumps and quad sets in supine x 1.  Seated LAQ, marches, and ab/add x 10 Other Exercises: commode to void    General Comments        Pertinent Vitals/Pain Pain Assessment: Faces Faces Pain Scale: Hurts little more Pain Location: R hip Pain Descriptors / Indicators: Sore;Aching Pain Intervention(s): Monitored during session;Limited activity within patient's tolerance;Repositioned    Home Living                      Prior Function            PT Goals (current goals can now  be found in the care plan section) Acute Rehab PT Goals Patient Stated Goal: To walk better PT Goal Formulation: With patient Time For Goal Achievement: 12/31/19 Potential to Achieve Goals: Fair    Frequency    BID      PT Plan Current plan remains appropriate    Co-evaluation              AM-PAC PT "6 Clicks" Mobility   Outcome Measure  Help needed turning from your back to your side while in a flat bed without using bedrails?: A Little Help needed moving from lying on your back to sitting on the side of a flat bed without using bedrails?: A Little Help needed moving to and from a bed to a chair (including a wheelchair)?: A Little Help needed standing up from a chair using your arms (e.g., wheelchair or bedside chair)?: A Little Help needed to walk in hospital room?: A Lot Help needed climbing 3-5 steps with a railing? : Total 6 Click Score: 15    End of Session Equipment Utilized During Treatment: Gait belt Activity Tolerance: Patient tolerated treatment well;Patient limited by fatigue Patient left: in chair;with call bell/phone within reach;with chair alarm set Nurse Communication: Mobility status;Weight bearing status Pain - Right/Left: Right Pain - part of body: Hip     Time: 1610-9604 PT Time Calculation (min) (ACUTE ONLY): 24 min  Charges:  $Therapeutic Exercise: 8-22 mins $Therapeutic Activity: 8-22 mins                    Chesley Noon, PTA 12/20/19, 10:22 AM

## 2019-12-20 NOTE — Progress Notes (Signed)
Subjective: Complaining of some soreness around the hip.  Weak with ambulation.   Objective: Vital signs in last 24 hours: Temp:  [98.5 F (36.9 C)-99.9 F (37.7 C)] 98.5 F (36.9 C) (07/11 1123) Pulse Rate:  [79-93] 87 (07/11 1123) Resp:  [15-16] 16 (07/11 1123) BP: (103-130)/(52-79) 104/52 (07/11 1123) SpO2:  [93 %-99 %] 96 % (07/11 1123)  Intake/Output from previous day: 07/10 0701 - 07/11 0700 In: 600 [P.O.:600] Out: 600 [Urine:600] Intake/Output this shift: No intake/output data recorded.  Recent Labs    12/18/19 0429 12/19/19 0552 12/20/19 0416  HGB 10.7* 10.3* 9.9*   Recent Labs    12/19/19 0552 12/20/19 0416  WBC 11.6* 11.5*  RBC 3.43* 3.29*  HCT 31.6* 30.2*  PLT 175 165   Recent Labs    12/19/19 0552 12/20/19 0416  NA 141 138  K 4.1 4.0  CL 107 105  CO2 29 27  BUN 22 23  CREATININE 0.85 0.91  GLUCOSE 106* 106*  CALCIUM 7.7* 7.9*   No results for input(s): LABPT, INR in the last 72 hours.  Wound VAC in place with no drainage.  Thigh is soft no evidence of hematoma or significant postop swelling.  No ecchymosis.  Distally she is neurovascular intact in the leg able flex tender toes with no calf tenderness.  Assessment/Plan: Slow progress post right total hip replacement for severe arthritis and probable femoral head fracture.  Encouraged her to work hard on therapy to try to get home rather than to rehab unit if possible.   Hessie Knows 12/20/2019, 11:51 AM

## 2019-12-21 LAB — BASIC METABOLIC PANEL
Anion gap: 4 — ABNORMAL LOW (ref 5–15)
BUN: 18 mg/dL (ref 8–23)
CO2: 29 mmol/L (ref 22–32)
Calcium: 8.6 mg/dL — ABNORMAL LOW (ref 8.9–10.3)
Chloride: 107 mmol/L (ref 98–111)
Creatinine, Ser: 0.88 mg/dL (ref 0.44–1.00)
GFR calc Af Amer: >60 mL/min (ref >60)
GFR calc non Af Amer: >60 mL/min (ref >60)
Glucose, Bld: 111 mg/dL — ABNORMAL HIGH (ref 70–99)
Potassium: 5.1 mmol/L (ref 3.5–5.1)
Sodium: 140 mmol/L (ref 135–145)

## 2019-12-21 LAB — CBC
HCT: 30.5 % — ABNORMAL LOW (ref 36.0–46.0)
Hemoglobin: 10.3 g/dL — ABNORMAL LOW (ref 12.0–15.0)
MCH: 30.7 pg (ref 26.0–34.0)
MCHC: 33.8 g/dL (ref 30.0–36.0)
MCV: 91 fL (ref 80.0–100.0)
Platelets: 202 10*3/uL (ref 150–400)
RBC: 3.35 MIL/uL — ABNORMAL LOW (ref 3.87–5.11)
RDW: 14.3 % (ref 11.5–15.5)
WBC: 11.4 10*3/uL — ABNORMAL HIGH (ref 4.0–10.5)
nRBC: 0 % (ref 0.0–0.2)

## 2019-12-21 LAB — SARS CORONAVIRUS 2 BY RT PCR (HOSPITAL ORDER, PERFORMED IN ~~LOC~~ HOSPITAL LAB): SARS Coronavirus 2: NEGATIVE

## 2019-12-21 LAB — SURGICAL PATHOLOGY

## 2019-12-21 NOTE — Care Management Important Message (Signed)
Important Message  Patient Details  Name: Linda Farley MRN: [id number] Date of Birth: Mar 16, 1951   Medicare Important Message Given:  Yes     Juliann Pulse A Jaydah Stahle 12/21/2019, 12:24 PM

## 2019-12-21 NOTE — Plan of Care (Signed)

## 2019-12-21 NOTE — Progress Notes (Signed)
   Subjective: 4 Days Post-Op Procedure(s) (LRB): TOTAL HIP ARTHROPLASTY ANTERIOR APPROACH (Right) Patient reports pain as mild.   Patient is well, and has had no acute complaints or problems Denies any CP, SOB, ABD pain. We will continue therapy today.  Plan is to go Skilled nursing facility after hospital stay.  Objective: Vital signs in last 24 hours: Temp:  [97.9 F (36.6 C)-99.4 F (37.4 C)] 97.9 F (36.6 C) (07/12 0759) Pulse Rate:  [77-87] 77 (07/12 0759) Resp:  [16-20] 20 (07/12 0759) BP: (93-117)/(52-66) 93/62 (07/12 0759) SpO2:  [95 %-100 %] 96 % (07/12 0759)  Intake/Output from previous day: 07/11 0701 - 07/12 0700 In: 400 [P.O.:400] Out: -  Intake/Output this shift: No intake/output data recorded.  Recent Labs    12/19/19 0552 12/20/19 0416 12/21/19 0610  HGB 10.3* 9.9* 10.3*   Recent Labs    12/20/19 0416 12/21/19 0610  WBC 11.5* 11.4*  RBC 3.29* 3.35*  HCT 30.2* 30.5*  PLT 165 202   Recent Labs    12/20/19 0416 12/21/19 0610  NA 138 140  K 4.0 5.1  CL 105 107  CO2 27 29  BUN 23 18  CREATININE 0.91 0.88  GLUCOSE 106* 111*  CALCIUM 7.9* 8.6*   No results for input(s): LABPT, INR in the last 72 hours.  EXAM General - Patient is Alert, Appropriate and Oriented Extremity - Neurovascular intact Sensation intact distally Intact pulses distally Dorsiflexion/Plantar flexion intact No cellulitis present Compartment soft Dressing - dressing C/D/I and no drainage, prevena intact with out drainage Motor Function - intact, moving foot and toes well on exam.   Past Medical History:  Diagnosis Date  . Allergy   . Anxiety   . Arthritis   . Depression   . Hypertension     Assessment/Plan:   4 Days Post-Op Procedure(s) (LRB): TOTAL HIP ARTHROPLASTY ANTERIOR APPROACH (Right) Active Problems:   Intractable low back pain  Estimated body mass index is 29.95 kg/m as calculated from the following:   Height as of this encounter: 5\' 5"  (1.651  m).   Weight as of this encounter: 81.6 kg. Advance diet Up with therapy  Pain well controlled VSS Labs stable  Lovenox 40 mg SQ daily x 14 days at discharge TED hose BLE x 6 weeks at discharge Follow up with [facility] in 2 weeks   DVT Prophylaxis - Aspirin, Lovenox and SCDS Weight-Bearing as tolerated to right leg   T. Rachelle Hora, PA-C [place] 12/21/2019, 8:16 AM

## 2019-12-21 NOTE — TOC Progression Note (Signed)
Transition of Care [facility]) - Progression Note    Patient Details  Name: Linda Farley MRN: [id number] Date of Birth: 12-Mar-1951  [facility] CM/SW Contact  [place] Dresser, Gardiner Rhyme, LCSW Phone Number: 12/21/2019, 1:53 PM  Clinical Narrative:   Met with pt and sister in her room to found out decision regarding which bed offer would like to accept. Both have decided upon [facility] and have contacted [facility] to accept bed. Have contacted [facility] to ask to start insurance auth for facility and EMS. Have asked MD to order COVID test and will work on transfer tomorrow.    Expected Discharge Plan: Home/Self Care Barriers to Discharge: Continued Medical Work up  Expected Discharge Plan and Services Expected Discharge Plan: Home/Self Care In-house Referral: Clinical Social Work   Post Acute Care Choice: Durable Medical Equipment Living arrangements for the past 2 months: Single Family Home                 DME Arranged: Walker rolling DME Agency: [employer] Date DME Agency Contacted: 12/15/19 Time DME Agency Contacted: [phone] Representative spoke with at DME Agency: Zack             Social Determinants of Health ([place]) Interventions    Readmission Risk Interventions No flowsheet data found.

## 2019-12-21 NOTE — Progress Notes (Signed)
Physical Therapy Treatment Patient Details Name: Linda Farley MRN: [id number] DOB: May 09, 1951 Today's Date: 12/21/2019    History of Present Illness Patient is a 69 year old female presenting to the ED with acute onset worsening low back pain as well as right hip pain with radiation to right leg with associated right leg weakness/burning without tingling or numbness.  Neurosurgery consulted and recommending conservative management for lumbar degenerative disease, spinal stenosis.  Pt diagnosed with right femoral head fracture and is s/p R THA.  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  MD assessment also includes palpitations, and chronic low back pain with radiculopathy. PMH includes: peripheral neuropathy, depression, and HTN.    PT Comments    Pt performed well with therapy and continues to demonstrate increased carryover with verbal cuing for proper placement of the feet and hands necessary for transfers.  Pt continues to be steady in sitting.  Pt does still lack the stability and strength in the LLE for weight support when lifting the RLE for advancement during gait.  Pt has been given verbal cues to rectify the issue, however still struggles due to weakness in the LLE.  Pt was able to stand to walker and pivot on feet to the bedside commode.  After self care, pt was able to progress the RLE with greater hip flexor activation than in past treatment sessions, in order to transfer to the recliner.  Pt will continue to benefit from skilled therapy to address strength deficits necessary for independent function.  Orthostatics were checked by OT after lunch, minimal drop noted, pt with no complaints of dizziness with this session, but will continue to monitor going forward.   Follow Up Recommendations  SNF     Equipment Recommendations  Rolling walker with 5" wheels    Recommendations for Other Services       Precautions /  Restrictions Precautions Precautions: Fall;Anterior Hip Precaution Booklet Issued: Yes (comment) Restrictions Weight Bearing Restrictions: Yes RLE Weight Bearing: Weight bearing as tolerated Other Position/Activity Restrictions: reports of nausea; monitor BP    Mobility  Bed Mobility Overal bed mobility: Needs Assistance Bed Mobility: Supine to Sit     Supine to sit: HOB elevated;Min assist Sit to supine: Mod assist;Max assist   General bed mobility comments: pt in bed at beginning/end of session  Transfers Overall transfer level: Needs assistance Equipment used: Rolling walker (2 wheeled) Transfers: Sit to/from Stand Sit to Stand: Min assist         General transfer comment: Pt performed STS with RW, and continues to use shuffling of feet to perform transfer.  Ambulation/Gait Ambulation/Gait assistance: Min assist Gait Distance (Feet): 4 Feet Assistive device: Rolling walker (2 wheeled) Gait Pattern/deviations: Decreased step length - right;Decreased step length - left;Trunk flexed Gait velocity: decreased   General Gait Details: able to take steps with encouragement and verbal cues   Stairs             Wheelchair Mobility    Modified Rankin (Stroke Patients Only)       Balance Overall balance assessment: Needs assistance;History of Falls Sitting-balance support: Feet unsupported;No upper extremity supported Sitting balance-Leahy Scale: Good     Standing balance support: Bilateral upper extremity supported Standing balance-Leahy Scale: Fair Standing balance comment: Mod lean on the RW for support  Cognition Arousal/Alertness: Awake/alert Behavior During Therapy: WFL for tasks assessed/performed Overall Cognitive Status: Within Functional Limits for tasks assessed                                 General Comments: Pt very pleasant and in good humor      Exercises Other Exercises Other  Exercises: Supine to sit with use of handrails Other Exercises: STS with RW with good hand placement, minA for initation and cuing for forward lean; Other Exercises: Amb 4 ft with minA for RW management, and balance. Pt able to advance LLE with success, however RLE tends to scoot forward rather than actively lifting.    General Comments        Pertinent Vitals/Pain Pain Assessment: Faces Pain Score: 6  Faces Pain Scale: Hurts little more Pain Location: R hip Pain Descriptors / Indicators: Burning Pain Intervention(s): Monitored during session;Limited activity within patient's tolerance;Repositioned    Home Living                      Prior Function            PT Goals (current goals can now be found in the care plan section) Acute Rehab PT Goals Patient Stated Goal: To walk better PT Goal Formulation: With patient Time For Goal Achievement: 12/31/19 Potential to Achieve Goals: Fair Progress towards PT goals: Progressing toward goals    Frequency    BID      PT Plan Current plan remains appropriate    Co-evaluation              AM-PAC PT "6 Clicks" Mobility   Outcome Measure  Help needed turning from your back to your side while in a flat bed without using bedrails?: A Little Help needed moving from lying on your back to sitting on the side of a flat bed without using bedrails?: A Little Help needed moving to and from a bed to a chair (including a wheelchair)?: A Little Help needed standing up from a chair using your arms (e.g., wheelchair or bedside chair)?: A Little Help needed to walk in hospital room?: A Lot Help needed climbing 3-5 steps with a railing? : Total 6 Click Score: 15    End of Session Equipment Utilized During Treatment: Gait belt Activity Tolerance: Patient tolerated treatment well;Patient limited by fatigue Patient left: in chair;with call bell/phone within reach;with chair alarm set Nurse Communication: Mobility status;Weight  bearing status PT Visit Diagnosis: Muscle weakness (generalized) (M62.81);Other abnormalities of gait and mobility (R26.89);Pain;History of falling (Z91.81);Other symptoms and signs involving the nervous system (R29.898) Pain - Right/Left: Right Pain - part of body: Hip     Time: 6578-4696 PT Time Calculation (min) (ACUTE ONLY): 24 min  Charges:  $Gait Training: 8-22 mins $Therapeutic Activity: 8-22 mins                     Gwenlyn Saran, PT, DPT 12/21/19, 4:57 PM

## 2019-12-21 NOTE — Progress Notes (Signed)
[facility] PROGRESS NOTE   Linda Farley ZJQ:[id number] DOB: 04/26/1951 DOA: 12/14/2019  PCP: [name], FNP  Brief History/Interval Summary: 69 y.o. Caucasian female with a known history of hypertension, depression, spinal stenosis with chronic low back pain, who presented to the emergency room with acute onset of worsening low back pain which has been intractable as well as right hip pain with radiation to her right leg with associated right leg weakness and burning without tingling or numbness.  She has an appointment for discussion about right hip arthroplasty with Dr. [name] in [facility] coming soon.  Patient was unable to weight-bear and ambulate.  She was hospitalized.  Neurosurgery as well as orthopedic surgery was consulted.  Reason for Visit: Significant pain in the right hip with inability to bear weight.  Consultants: Neurosurgery.  Orthopedics.  Procedures:  7/8: TOTAL HIP ARTHROPLASTY ANTERIOR APPROACH (Right)  Transthoracic echocardiogram was also done.  See below.  Antibiotics: Anti-infectives (From admission, onward)   Start     Dose/Rate Route Frequency Ordered Stop   12/17/19 1930  ceFAZolin (ANCEF) IVPB 2g/100 mL premix        2 g 200 mL/hr over 30 Minutes Intravenous Every 6 hours 12/17/19 1715 12/18/19 0700   12/17/19 1200  ceFAZolin (ANCEF) IVPB 2g/100 mL premix        2 g 200 mL/hr over 30 Minutes Intravenous  Once 12/16/19 0745 12/17/19 1345      Subjective/Interval History: States that her itchiness in the back area is improving.  She denies any other complaints currently.  Vision is gradually getting better.     Assessment/Plan:  Intractable right hip pain with inability to bear weight and ambulate Patient known to have arthritis.  She was supposed to see orthopedics in the outpatient setting to discuss hip joint replacement.  She is unable to bear weight on the right side.  Examination did not suggest any fractures.  She did have a  CT scan which does not show any fractures in that area.  Patient seen by orthopedics.  Patient underwent total hip replacement on 7/8.   Seems to be stable from an orthopedic standpoint.  Orthopedics is following.   Will need Lovenox 40 mg subcu daily for 14 days at discharge.  TED hose bilateral lower extremities for 6 weeks.  Follow-up with orthopedics in 2 weeks.  Acute stroke Patient developed double vision early morning on 7/9.  MRI brain shows evidence for acute infarct in the left lobe.  Neurology was consulted.  Stroke work-up was initiated.  Patient currently on aspirin.  Echocardiogram does not show any acute findings.  Normal systolic function.  Bubble study was negative.  CT angiogram of the head and neck does not show any significant stenosis. LDL is noted to be 82.  Started on atorvastatin.  HbA1c was 5.9. Further management per neurology but it does not look like there is any further work-up planned.  PT and OT evaluation.  SNF is recommended. Patient will need outpatient ophthalmology referral.  Fever She was noted to be febrile on 7/10.  Infectious work-up has been negative including chest x-ray and UA.  Fever appears to have subsided.  Could be postprocedural.  No significant increase in WBC noted.  Fever appears to be subsiding.  No antibiotics for now.    Palpitations EKG was repeated and shows heart rate to be 90.  Similar to EKG done at the time of admission.  Appears to have resolved.  Thyroid function  test back in March were normal.  Normocytic anemia/acute blood loss anemia Drop in hemoglobin likely due to surgery.  No other overt bleeding noted.  Continue to monitor.  Has not required any transfusion so far.  Chronic low back pain with radiculopathy Seen by neurosurgery.  MRI of the lumbar spine was done which does not show any acute findings.  She was given intravenous dexamethasone for a few days.  Her back pain appears to be stable.  No plans for any surgical  intervention currently.  Follow-up with her physiatrist.   Essential hypertension Blood pressure is reasonably well controlled.  On lisinopril/HCTZ at home.  Only lisinopril was continued.  Holding for permissive hypertension due to stroke.  Peripheral neuropathy Continue with gabapentin.  History of depression Continue with citalopram  Rash in the upper back Maculopapular rash was noted.  Very itchy per patient.  Started on steroid cream with improvement.   DVT Prophylaxis: Lovenox Code Status: Full code Family Communication: Discussed with the patient Disposition Plan: Anticipate discharge to skilled nursing facility by tomorrow.   Status is: Inpatient  Remains inpatient appropriate because:Ongoing active pain requiring inpatient pain management   Dispo:  Patient From: Home  Planned Disposition: [place]  Expected discharge date: 12/21/19  Medically stable for discharge: No       Medications:  Scheduled: . aspirin  300 mg Rectal Daily   Or  . aspirin  325 mg Oral Daily  . atorvastatin  20 mg Oral Daily  . citalopram  20 mg Oral Daily  . docusate sodium  100 mg Oral BID  . enoxaparin (LOVENOX) injection  40 mg Subcutaneous Q24H  . gabapentin  300 mg Oral Daily  . gabapentin  600 mg Oral QHS  . lisinopril  20 mg Oral Daily  . magnesium oxide  400 mg Oral Daily  . polyethylene glycol  17 g Oral Daily  . triamcinolone cream   Topical TID   Continuous:  XIP:[id number] **OR** acetaminophen, albuterol, alum & mag hydroxide-simeth, bisacodyl, camphor-menthol, diphenhydrAMINE, HYDROcodone-acetaminophen, magnesium citrate, magnesium hydroxide, magnesium hydroxide, menthol-cetylpyridinium **OR** phenol, metoCLOPramide **OR** metoCLOPramide (REGLAN) injection, morphine injection, ondansetron **OR** ondansetron (ZOFRAN) IV, ondansetron **OR** ondansetron (ZOFRAN) IV, sodium phosphate, traMADol, traZODone   Objective:  Vital Signs  Vitals:    12/20/19 2002 12/21/19 0324 12/21/19 0759 12/21/19 0917  BP: (!) 116/59 (!) 105/58 93/62 (!) 119/108  Pulse: 79 85 77 100  Resp: 16 16 20    Temp: 99.2 F (37.3 C) 99.4 F (37.4 C) 97.9 F (36.6 C)   TempSrc: Oral Oral Oral   SpO2: 100% 95% 96%   Weight:      Height:        Intake/Output Summary (Last 24 hours) at 12/21/2019 1116 Last data filed at 12/20/2019 1343 Gross per 24 hour  Intake 400 ml  Output -  Net 400 ml   Filed Weights   12/14/19 1710  Weight: 81.6 kg    General appearance: Awake alert.  In no distress Resp: Clear to auscultation bilaterally.  Normal effort Cardio: S1-S2 is normal regular.  No S3-S4.  No rubs murmurs or bruit GI: Abdomen is soft.  Nontender nondistended.  Bowel sounds are present normal.  No masses organomegaly Extremities: Right leg has decreased range of motion due to surgery. Rash over the upper back noted to be less erythematous today. Neurologic: Alert and oriented x3.  No focal neurological deficits.    Lab Results:  Data Reviewed: I have personally reviewed following labs and  imaging studies  CBC: Recent Labs  Lab 12/17/19 0626 12/18/19 0429 12/19/19 0552 12/20/19 0416 12/21/19 0610  WBC 10.5 9.8 11.6* 11.5* 11.4*  HGB 12.7 10.7* 10.3* 9.9* 10.3*  HCT 38.4 32.3* 31.6* 30.2* 30.5*  MCV 91.0 92.0 92.1 91.8 91.0  PLT 242 182 175 165 106    Basic Metabolic Panel: Recent Labs  Lab 12/17/19 0626 12/18/19 0429 12/19/19 0552 12/20/19 0416 12/21/19 0610  NA 141 141 141 138 140  K 4.7 3.7 4.1 4.0 5.1  CL 105 106 107 105 107  CO2 28 28 29 27 29   GLUCOSE 131* 96 106* 106* 111*  BUN 25* 26* 22 23 18   CREATININE 0.93 1.02* 0.85 0.91 0.88  CALCIUM 8.9 7.9* 7.7* 7.9* 8.6*    GFR: Estimated Creatinine Clearance: 63.6 mL/min (by C-G formula based on SCr of 0.88 mg/dL).  Liver Function Tests: Recent Labs  Lab 12/14/19 1711  AST 17  ALT 12  ALKPHOS 87  BILITOT 0.9  PROT 6.8  ALBUMIN 3.9     Recent Results (from  the past 240 hour(s))  SARS Coronavirus 2 by RT PCR (hospital order, performed in [facility] hospital lab) Nasopharyngeal Nasopharyngeal Swab     Status: None   Collection Time: 12/15/19 12:35 AM   Specimen: Nasopharyngeal Swab  Result Value Ref Range Status   SARS Coronavirus 2 NEGATIVE NEGATIVE Final    Comment: (NOTE) SARS-CoV-2 target nucleic acids are NOT DETECTED.  The SARS-CoV-2 RNA is generally detectable in upper and lower respiratory specimens during the acute phase of infection. The lowest concentration of SARS-CoV-2 viral copies this assay can detect is 250 copies / mL. A negative result does not preclude SARS-CoV-2 infection and should not be used as the sole basis for treatment or other patient management decisions.  A negative result may occur with improper specimen collection / handling, submission of specimen other than nasopharyngeal swab, presence of viral mutation(s) within the areas targeted by this assay, and inadequate number of viral copies (<250 copies / mL). A negative result must be combined with clinical observations, patient history, and epidemiological information.  Fact Sheet for Patients:   StrictlyIdeas.no  Fact Sheet for Healthcare Providers: BankingDealers.co.za  This test is not yet approved or  cleared by the [place] FDA and has been authorized for detection and/or diagnosis of SARS-CoV-2 by FDA under an Emergency Use Authorization (EUA).  This EUA will remain in effect (meaning this test can be used) for the duration of the COVID-19 declaration under Section 564(b)(1) of the Act, 21 U.S.C. section 360bbb-3(b)(1), unless the authorization is terminated or revoked sooner.  Performed at [facility], [address] [place], [place] [address]   Surgical pcr screen     Status: None   Collection Time: 12/16/19  3:57 PM   Specimen: Nasal Mucosa; Nasal Swab  Result Value Ref Range  Status   MRSA, PCR NEGATIVE NEGATIVE Final   Staphylococcus aureus NEGATIVE NEGATIVE Final    Comment: (NOTE) The Xpert SA Assay (FDA approved for NASAL specimens in patients 71 years of age and older), is one component of a comprehensive surveillance program. It is not intended to diagnose infection nor to guide or monitor treatment. Performed at [facility], [address] [place],  [address]       Radiology Studies: [facility] Chest [place] 1 View  Result Date: 12/19/2019 CLINICAL DATA:  Fever.  Recent total hip arthroplasty EXAM: PORTABLE CHEST 1 VIEW COMPARISON:  December 14, 2019 FINDINGS:  Lungs are clear. Heart size and pulmonary vascularity are normal. No adenopathy. There is extensive arthropathy in each shoulder. IMPRESSION: Lungs clear. Cardiac silhouette within normal limits. No evident adenopathy. Electronically Signed   By: Lowella Grip III M.D.   On: 12/19/2019 13:38       LOS: 6 days   [facility] Pager on [email or web address]  12/21/2019, 11:16 AM

## 2019-12-21 NOTE — Progress Notes (Signed)
Physical Therapy Treatment Patient Details Name: Linda Farley MRN: [id number] DOB: Jul 10, 1950 Today's Date: 12/21/2019    History of Present Illness Patient is a 69 year old female presenting to the ED with acute onset worsening low back pain as well as right hip pain with radiation to right leg with associated right leg weakness/burning without tingling or numbness.  Neurosurgery consulted and recommending conservative management for lumbar degenerative disease, spinal stenosis.  Pt diagnosed with right femoral head fracture and is s/p R THA.  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  MD assessment also includes palpitations, and chronic low back pain with radiculopathy. PMH includes: peripheral neuropathy, depression, and HTN.    PT Comments    Pt ready for session.  BP 126/85 P 85 Participated in exercises as described below.  To EOB with min a x 1 but overall improved from yesterday.  Steady in sitting.  She is able to stand to walker and pivots on feet to commode with no true steps.  After ind self care she is able to stand and take several effortful small steps to recliner.   After transition to recliner, pt reported increasing dizziness.  Upon questioning she reported dizziness this am when on commode too to the point she needed to call for assist due to fear of falling/passing out.  BP taken in sitting 114/57 P 100.  She continues with some dizziness especially when looking to left which she avoids.  Will check orthostatic vitals this pm as a precaution to help determine cause of dizzinesd as he did decrease from  Initial reading.   Follow Up Recommendations  SNF     Equipment Recommendations  Rolling walker with 5" wheels    Recommendations for Other Services       Precautions / Restrictions Precautions Precautions: Fall;Anterior Hip Precaution Booklet Issued: Yes (comment) Restrictions Weight Bearing Restrictions:  Yes RLE Weight Bearing: Weight bearing as tolerated    Mobility  Bed Mobility Overal bed mobility: Needs Assistance Bed Mobility: Supine to Sit     Supine to sit: HOB elevated;Min assist        Transfers Overall transfer level: Needs assistance Equipment used: Rolling walker (2 wheeled) Transfers: Sit to/from Stand Sit to Stand: Min assist            Ambulation/Gait Ambulation/Gait assistance: Min Web designer (Feet): 4 Feet Assistive device: Rolling walker (2 wheeled) Gait Pattern/deviations: Decreased step length - right;Decreased step length - left;Trunk flexed Gait velocity: decreased   General Gait Details: able to take steps today with encouragement and verbal cues   Stairs             Wheelchair Mobility    Modified Rankin (Stroke Patients Only)       Balance Overall balance assessment: Needs assistance;History of Falls Sitting-balance support: Feet unsupported;No upper extremity supported Sitting balance-Leahy Scale: Good     Standing balance support: Bilateral upper extremity supported Standing balance-Leahy Scale: Fair Standing balance comment: Mod lean on the RW for support                            Cognition Arousal/Alertness: Awake/alert Behavior During Therapy: WFL for tasks assessed/performed Overall Cognitive Status: Within Functional Limits for tasks assessed  Exercises Total Joint Exercises Ankle Circles/Pumps: AROM;20 reps;Both Short Arc Quad: Right;AROM;10 reps Heel Slides: 10 reps;AROM;Right    General Comments        Pertinent Vitals/Pain Pain Assessment: Faces Faces Pain Scale: Hurts little more Pain Descriptors / Indicators: Sore;Aching Pain Intervention(s): Premedicated before session;Limited activity within patient's tolerance;Monitored during session;Repositioned    Home Living                      Prior Function             PT Goals (current goals can now be found in the care plan section) Progress towards PT goals: Progressing toward goals    Frequency    BID      PT Plan Current plan remains appropriate    Co-evaluation              AM-PAC PT "6 Clicks" Mobility   Outcome Measure  Help needed turning from your back to your side while in a flat bed without using bedrails?: A Little Help needed moving from lying on your back to sitting on the side of a flat bed without using bedrails?: A Little Help needed moving to and from a bed to a chair (including a wheelchair)?: A Little Help needed standing up from a chair using your arms (e.g., wheelchair or bedside chair)?: A Little Help needed to walk in hospital room?: A Lot Help needed climbing 3-5 steps with a railing? : Total 6 Click Score: 15    End of Session Equipment Utilized During Treatment: Gait belt Activity Tolerance: Patient tolerated treatment well;Patient limited by fatigue Patient left: in chair;with call bell/phone within reach;with chair alarm set Nurse Communication: Mobility status;Weight bearing status Pain - Right/Left: Right Pain - part of body: Hip     Time: 7793-9030 PT Time Calculation (min) (ACUTE ONLY): 28 min  Charges:  $Gait Training: 8-22 mins $Therapeutic Exercise: 8-22 mins                    Chesley Noon, PTA 12/21/19, 12:43 PM

## 2019-12-21 NOTE — Progress Notes (Signed)
Given PRN milk of magnesia d/t no BM x 3 days. Up in recliner, denies pain or discomfort at this time. Will continue to monitor .

## 2019-12-21 NOTE — Progress Notes (Signed)
Occupational Therapy Treatment Patient Details Name: Linda Farley MRN: [id number] DOB: 1950/11/22 Today's Date: 12/21/2019    History of present illness Patient is a 69 year old female presenting to the ED with acute onset worsening low back pain as well as right hip pain with radiation to right leg with associated right leg weakness/burning without tingling or numbness.  Neurosurgery consulted and recommending conservative management for lumbar degenerative disease, spinal stenosis.  Pt diagnosed with right femoral head fracture and is s/p R THA.  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  MD assessment also includes palpitations, and chronic low back pain with radiculopathy. PMH includes: peripheral neuropathy, depression, and HTN.   OT comments  Pt presents this morning cheerful and agreeable to treatment. She requested this therapist sit on her right side, given that she has continued left side visual blurriness and double vision. Unable to tolerate further visual assessment 2/2 nausea. Pt completed face washing and grooming while seated in recliner with Setup A. Completed UBD with Min A, due to limited shoulder ROM (h/o frozen shoulders). Educated pt re: pain management strategies (pursed lip breathing, cognitive distraction and ice application). Pt reported 6/10 pain in RLE at start of session; 5/10 following incorporation of strategies. Pt completed STS t/f using RW with Min A. She became nauseated and dizzy at <1 min standing, requesting to return to seated. BP prior to standing: 127/67. BP in standing: 113/79, RN notified. Pt will continue to benefit from skilled acute OT services to address functional deficits (see below for more information) impacting ADL performance. Continue to recommend SNF as most appropriate discharge recommendation; discussed with pt and family member, they both verbalized understanding.       Follow Up  Recommendations  SNF    Equipment Recommendations  Other (comment) (TBD)    Recommendations for Other Services      Precautions / Restrictions Precautions Precautions: Fall;Anterior Hip Precaution Booklet Issued: No Restrictions Weight Bearing Restrictions: Yes RLE Weight Bearing: Weight bearing as tolerated Other Position/Activity Restrictions: reports of nausea; monitor BP       Mobility Bed Mobility Overal bed mobility: Needs Assistance         General bed mobility comments: pt up in chair at beginning/end of session  Transfers Overall transfer level: Needs assistance Equipment used: Rolling walker (2 wheeled) Transfers: Sit to/from Stand Sit to Stand: Min assist         General transfer comment: Pt attempted STS with RW, but quickly fatigued and became nauseated and dizzy. BP seated, prior to standing: 127/67. BP in standing: 113/79.    Balance Overall balance assessment: Needs assistance;History of Falls Sitting-balance support: Feet unsupported;No upper extremity supported Sitting balance-Leahy Scale: Good     Standing balance support: Bilateral upper extremity supported Standing balance-Leahy Scale: Fair Standing balance comment: Heavy lean on RW for support                           ADL either performed or assessed with clinical judgement   ADL Overall ADL's : Needs assistance/impaired                                       General ADL Comments: Setup A for washing face and grooming seated in recliner. Min A for UBD. Max-Total A for LB  ADL. Further ADL and functional mobility deferred 2/2 pt nausea.     Vision Baseline Vision/History: Wears glasses Wears Glasses: At all times Patient Visual Report: Blurring of vision;Diplopia Additional Comments: Pt continues to have blurring vision and reports double vision when looking towards L side. Pt unable to tolerate visual assessment 2/2 nausea with attempts.   Perception      Praxis      Cognition Arousal/Alertness: Awake/alert Behavior During Therapy: WFL for tasks assessed/performed Overall Cognitive Status: Within Functional Limits for tasks assessed                                 General Comments: Pt very pleasant and in good humor        Exercises Other Exercises Other Exercises: Educated pt and family member re: discharge planning and the role of therapy in recovery Other Exercises: Instructed pt in safe DME use, pain management strategies (pursed lip breathing, ice application). Pt verbalized 6/10 pain prior to pain mgt strategies and 5/10 with use of pain mgt strategies.   Shoulder Instructions       General Comments      Pertinent Vitals/ Pain       Pain Assessment: 0-10 Pain Score: 6  Faces Pain Scale: Hurts little more Pain Location: R hip Pain Descriptors / Indicators: Burning Pain Intervention(s): Limited activity within patient's tolerance;Monitored during session;Ice applied;Repositioned;Utilized relaxation techniques (confirmed use of ice with RN)  Home Living                                          Prior Functioning/Environment              Frequency  Min 2X/week        Progress Toward Goals  OT Goals(current goals can now be found in the care plan section)  Progress towards OT goals: Progressing toward goals  Acute Rehab OT Goals Patient Stated Goal: To walk better OT Goal Formulation: With patient Time For Goal Achievement: 01/02/20 Potential to Achieve Goals: [place] Discharge plan remains appropriate;Frequency remains appropriate    Co-evaluation                 AM-PAC OT "6 Clicks" Daily Activity     Outcome Measure   Help from another person eating meals?: None Help from another person taking care of personal grooming?: None Help from another person toileting, which includes using toliet, bedpan, or urinal?: A Lot Help from another person bathing  (including washing, rinsing, drying)?: A Lot Help from another person to put on and taking off regular upper body clothing?: A Little Help from another person to put on and taking off regular lower body clothing?: A Lot 6 Click Score: 17    End of Session Equipment Utilized During Treatment: Gait belt;Rolling walker  OT Visit Diagnosis: Unsteadiness on feet (R26.81);Other abnormalities of gait and mobility (R26.89);History of falling (Z91.81);Repeated falls (R29.6)   Activity Tolerance Patient limited by fatigue (Pt limited by nausea)   Patient Left in bed;with call bell/phone within reach;with family/visitor present   Nurse Communication Other (comment) (Pt dizziness and BP)        Time: 1105-1140 OT Time Calculation (min): 35 min  Charges: OT General Charges $OT Visit: 1 Visit OT Treatments $Self Care/Home Management : 23-37 mins  Jerilynn Birkenhead, OTS  12/21/19, 1:20 PM

## 2019-12-22 ENCOUNTER — Encounter: Payer: Self-pay | Admitting: Family Medicine

## 2019-12-22 ENCOUNTER — Inpatient Hospital Stay: Payer: PPO

## 2019-12-22 DIAGNOSIS — I959 Hypotension, unspecified: Secondary | ICD-10-CM

## 2019-12-22 LAB — COMPREHENSIVE METABOLIC PANEL
ALT: 15 U/L (ref 0–44)
AST: 19 U/L (ref 15–41)
Albumin: 2.8 g/dL — ABNORMAL LOW (ref 3.5–5.0)
Alkaline Phosphatase: 68 U/L (ref 38–126)
Anion gap: 9 (ref 5–15)
BUN: 16 mg/dL (ref 8–23)
CO2: 25 mmol/L (ref 22–32)
Calcium: 8.1 mg/dL — ABNORMAL LOW (ref 8.9–10.3)
Chloride: 103 mmol/L (ref 98–111)
Creatinine, Ser: 1.01 mg/dL — ABNORMAL HIGH (ref 0.44–1.00)
GFR calc Af Amer: >60 mL/min (ref >60)
GFR calc non Af Amer: 57 mL/min — ABNORMAL LOW (ref >60)
Glucose, Bld: 100 mg/dL — ABNORMAL HIGH (ref 70–99)
Potassium: 4.4 mmol/L (ref 3.5–5.1)
Sodium: 137 mmol/L (ref 135–145)
Total Bilirubin: 0.6 mg/dL (ref 0.3–1.2)
Total Protein: 5.9 g/dL — ABNORMAL LOW (ref 6.5–8.1)

## 2019-12-22 LAB — CBC
HCT: 32.4 % — ABNORMAL LOW (ref 36.0–46.0)
Hemoglobin: 10.6 g/dL — ABNORMAL LOW (ref 12.0–15.0)
MCH: 30.3 pg (ref 26.0–34.0)
MCHC: 32.7 g/dL (ref 30.0–36.0)
MCV: 92.6 fL (ref 80.0–100.0)
Platelets: 233 10*3/uL (ref 150–400)
RBC: 3.5 MIL/uL — ABNORMAL LOW (ref 3.87–5.11)
RDW: 14 % (ref 11.5–15.5)
WBC: 10.8 10*3/uL — ABNORMAL HIGH (ref 4.0–10.5)
nRBC: 0 % (ref 0.0–0.2)

## 2019-12-22 LAB — MAGNESIUM: Magnesium: 1.9 mg/dL (ref 1.7–2.4)

## 2019-12-22 LAB — GLUCOSE, CAPILLARY: Glucose-Capillary: 139 mg/dL — ABNORMAL HIGH (ref 70–99)

## 2019-12-22 MED ORDER — IOHEXOL 350 MG/ML SOLN
75.0000 mL | Freq: Once | INTRAVENOUS | Status: AC | PRN
  Administered 2019-12-22: 75 mL via INTRAVENOUS

## 2019-12-22 MED ORDER — SODIUM CHLORIDE 0.9 % IV BOLUS
500.0000 mL | Freq: Once | INTRAVENOUS | Status: AC
  Administered 2019-12-22: 500 mL via INTRAVENOUS

## 2019-12-22 MED ORDER — ENOXAPARIN SODIUM 80 MG/0.8ML ~~LOC~~ SOLN
80.0000 mg | Freq: Two times a day (BID) | SUBCUTANEOUS | Status: DC
  Administered 2019-12-22 – 2019-12-23 (×3): 80 mg via SUBCUTANEOUS
  Filled 2019-12-22 (×4): qty 0.8

## 2019-12-22 MED ORDER — MECLIZINE HCL 12.5 MG PO TABS
12.5000 mg | ORAL_TABLET | Freq: Three times a day (TID) | ORAL | Status: DC
  Administered 2019-12-22 – 2019-12-23 (×2): 12.5 mg via ORAL
  Filled 2019-12-22 (×6): qty 1

## 2019-12-22 NOTE — Progress Notes (Signed)
   12/22/19 1056  Assess: MEWS Score  Temp 98.4 F (36.9 C)  BP (!) 126/48  Pulse Rate (!) 123  SpO2 99 %  Assess: MEWS Score  MEWS Temp 0  MEWS Systolic 0  MEWS Pulse 2  MEWS RR 0  MEWS LOC 0  MEWS Score 2  MEWS Score Color Yellow  Treat  MEWS Interventions Other (Comment)  Escalate  MEWS: Escalate Yellow: discuss with charge nurse/RN and consider discussing with provider and RRT (Not escalated)  Notify: Charge Nurse/RN  Name of Charge Nurse/RN Notified Yves Dill  Date Charge Nurse/RN Notified 12/22/19  Time Charge Nurse/RN Notified 1100  Notify: Provider  Provider Name/Title Dr. [place] Pink  Date Provider Notified 12/22/19  Time Provider Notified 1100  Notification Type Page  Notification Reason Other (Comment) (Pulse tachy and then normal )  Response No new orders  Date of Provider Response 12/22/19  Time of Provider Response 1101  Notify: Rapid Response  Name of Rapid Response RN Notified Dylan , RN  Date Rapid Response Notified 12/22/19  Time Rapid Response Notified 8916  Document  Patient Outcome Other (Comment) (Rechecked VS)  Progress note created (see row info) Yes

## 2019-12-22 NOTE — Progress Notes (Signed)
ANTICOAGULATION CONSULT NOTE - Initial Consult  Pharmacy Consult for Lovenox  Indication: DVT treament  Allergies  Allergen Reactions  . Chlorhexidine   . Penicillins Rash    Patient Measurements: Height: 5\' 5"  (165.1 cm) Weight: 81.6 kg (180 lb) IBW/kg (Calculated) : 57 Heparin Dosing Weight:    Vital Signs: Temp: 98.3 F (36.8 C) (07/13 1457) Temp Source: Oral (07/13 1457) BP: 114/62 (07/13 1457) Pulse Rate: 90 (07/13 1457)  Labs: Recent Labs    12/20/19 0416 12/20/19 0416 12/21/19 0610 12/22/19 1439  HGB 9.9*   < > 10.3* 10.6*  HCT 30.2*  --  30.5* 32.4*  PLT 165  --  202 233  CREATININE 0.91  --  0.88 1.01*   < > = values in this interval not displayed.    Estimated Creatinine Clearance: 55.4 mL/min (A) (by C-G formula based on SCr of 1.01 mg/dL (H)).   Medical History: Past Medical History:  Diagnosis Date  . Allergy   . Anxiety   . Arthritis   . Depression   . Hypertension     Medications:  Scheduled:  . aspirin  300 mg Rectal Daily   Or  . aspirin  325 mg Oral Daily  . atorvastatin  20 mg Oral Daily  . citalopram  20 mg Oral Daily  . docusate sodium  100 mg Oral BID  . enoxaparin (LOVENOX) injection  80 mg Subcutaneous Q12H  . gabapentin  300 mg Oral Daily  . gabapentin  600 mg Oral QHS  . magnesium oxide  400 mg Oral Daily  . meclizine  12.5 mg Oral TID  . polyethylene glycol  17 g Oral Daily  . triamcinolone cream   Topical TID   Infusions:    Assessment: 69 yo F to start therapeutic lovenox dosing for + DVT. Patient with recent hip surgery 12/17/19 Will discontinue prophylactic lovenox order. Hgb 10.6  Plt 233  Goal of Therapy:  Monitor platelets by anticoagulation protocol: Yes   Plan:  Will order Lovenox 80 mg (~ 1mg /kg) q12h. Will f/u CBC and Scr daily  Ashaunte Standley A 12/22/2019,3:27 PM

## 2019-12-22 NOTE — Progress Notes (Addendum)
[facility] PROGRESS NOTE   Linda Farley HAL:[id number] DOB: [birth year] DOA: 12/14/2019  PCP: [name], FNP  Brief History/Interval Summary: 69 y.o. Caucasian female with a known history of hypertension, depression, spinal stenosis with chronic low back pain, who presented to the emergency room with acute onset of worsening low back pain which has been intractable as well as right hip pain with radiation to her right leg with associated right leg weakness and burning without tingling or numbness.  She has an appointment for discussion about right hip arthroplasty with Dr. [name] in [place] clinic coming soon.  Patient was unable to weight-bear and ambulate.  She was hospitalized.  Neurosurgery as well as orthopedic surgery was consulted.  Reason for Visit: Significant pain in the right hip with inability to bear weight.  Consultants: Neurosurgery.  Orthopedics.  Procedures:  7/8: TOTAL HIP ARTHROPLASTY ANTERIOR APPROACH (Right)  Transthoracic echocardiogram was also done.  See below.  Antibiotics: Anti-infectives (From admission, onward)   Start     Dose/Rate Route Frequency Ordered Stop   12/17/19 1930  ceFAZolin (ANCEF) IVPB 2g/100 mL premix        2 g 200 mL/hr over 30 Minutes Intravenous Every 6 hours 12/17/19 1715 12/18/19 0700   12/17/19 1200  ceFAZolin (ANCEF) IVPB 2g/100 mL premix        2 g 200 mL/hr over 30 Minutes Intravenous  Once 12/16/19 0745 12/17/19 1345      Subjective/Interval History: Patient was evaluated earlier this morning and she stated that she was feeling better.  Called by the RN an hour later that the patient was feeling poorly.  She was noted to be hypotensive.  She was diaphoretic.  Patient seen at bedside immediately.  Patient denies any chest pain or shortness of breath.  She mentions that she was experiencing a spinning sensation of the room when she would open her eyes and turned to the left.  These symptoms are similar to what she was  experiencing about 2 days ago.     Assessment/Plan:  Hypotension Patient noted to be hypotensive.  Patient now noted to be lisinopril which has been discontinued.  Reason for her low blood pressure is not entirely clear.  She was given fluid boluses with improvement.  She again became hypotensive.  She denies any chest pain or shortness of breath.  Another fluid bolus has been ordered.  She was given narcotic pain medicine this morning which could have also contributed.  Considering that she is postoperative PE needs to be entertained as a possibility.  We will do a CT angiogram chest.  Repeat labs. CT angiogram chest was negative for PE.  Nonspecific opacities noted.  Patient not hypoxic.  We will continue to monitor for now.  CT head also was nonacute. Labs reviewed. Patient doing better.  Acute DVT right lower extremity Acute DVT noted in the distal veins of the right leg.  Since patient is recently status post hip replacement and her mobility will be poor for the immediate future we will place the patient on full anticoagulation for now.   Intractable right hip pain with inability to bear weight and ambulate Patient known to have arthritis.  She was supposed to see orthopedics in the outpatient setting to discuss hip joint replacement.  She is unable to bear weight on the right side.  Examination did not suggest any fractures.  She did have a CT scan which does not show any fractures in that area.  Patient  seen by orthopedics.  Patient underwent total hip replacement on 7/8.   Seems to be stable from an orthopedic standpoint.  Orthopedics is following.   Will need Lovenox 40 mg subcu daily for 14 days at discharge.  TED hose bilateral lower extremities for 6 weeks.  Follow-up with orthopedics in 2 weeks.  Acute stroke Patient developed double vision early morning on 7/9.  MRI brain shows evidence for acute infarct in the left lobe.  Neurology was consulted.  Stroke work-up was initiated.   Patient currently on aspirin.  Echocardiogram does not show any acute findings.  Normal systolic function.  Bubble study was negative.  CT angiogram of the head and neck does not show any significant stenosis. LDL is noted to be 82.  Started on atorvastatin.  HbA1c was 5.9. No further work-up per neurology.  PT OT evaluation.  SNF was recommended.  Patient will need outpatient ophthalmology referral. Patient with vertiginous symptoms when she turns her head to the left.  Seems to be about the same as it was 2 days ago but considering all the other acute issues we will also repeat a CT head.  Fever She was noted to be febrile on 7/10.  Infectious work-up has been negative including chest x-ray and UA.  Fever appears to have subsided.  Could be postprocedural.  No significant increase in WBC noted.  Noted to have low-grade fever this morning.  We will have ordered a lower extremity Doppler study.  Repeat labs.   Palpitations This occurred a few days ago.  EKG did not show any acute findings.  Thyroid function test back in March were normal.  Normocytic anemia/acute blood loss anemia Drop in hemoglobin likely due to surgery.  No other overt bleeding noted.  Continue to monitor.  Has not required any transfusion so far.  Labs from this morning pending.  Chronic low back pain with radiculopathy Seen by neurosurgery.  MRI of the lumbar spine was done which does not show any acute findings.  She was given intravenous dexamethasone for a few days.  Her back pain appears to be stable.  No plans for any surgical intervention currently.  Follow-up with her physiatrist.   Essential hypertension Blood pressure is reasonably well controlled.  On lisinopril/HCTZ at home.  See above under hypotension.  Holding all her antihypertensives.    Peripheral neuropathy Continue with gabapentin.  History of depression Continue with citalopram  Rash in the upper back Maculopapular rash was noted.  Very itchy per  patient.  Started on steroid cream with improvement.   DVT Prophylaxis: Lovenox Code Status: Full code Family Communication: Discussed with the patient Disposition Plan: Plan was for discharge to SNF today but due to hypotension we will have to cancel discharge.    Status is: Inpatient  Remains inpatient appropriate because:Ongoing active pain requiring inpatient pain management   Dispo:  Patient From: Home  Planned Disposition: [place]  Expected discharge date: 12/22/19  Medically stable for discharge: No       Medications:  Scheduled: . aspirin  300 mg Rectal Daily   Or  . aspirin  325 mg Oral Daily  . atorvastatin  20 mg Oral Daily  . citalopram  20 mg Oral Daily  . docusate sodium  100 mg Oral BID  . enoxaparin (LOVENOX) injection  40 mg Subcutaneous Q24H  . gabapentin  300 mg Oral Daily  . gabapentin  600 mg Oral QHS  . lisinopril  20 mg Oral Daily  .  magnesium oxide  400 mg Oral Daily  . meclizine  12.5 mg Oral TID  . polyethylene glycol  17 g Oral Daily  . triamcinolone cream   Topical TID   Continuous: . sodium chloride     VZD:GLOVFIEPPIRJJ **OR** acetaminophen, albuterol, alum & mag hydroxide-simeth, bisacodyl, camphor-menthol, diphenhydrAMINE, HYDROcodone-acetaminophen, magnesium citrate, magnesium hydroxide, magnesium hydroxide, menthol-cetylpyridinium **OR** phenol, metoCLOPramide **OR** metoCLOPramide (REGLAN) injection, morphine injection, ondansetron **OR** ondansetron (ZOFRAN) IV, ondansetron **OR** ondansetron (ZOFRAN) IV, sodium phosphate, traMADol, traZODone   Objective:  Vital Signs  Vitals:   12/22/19 0723 12/22/19 0900 12/22/19 1056 12/22/19 1100  BP: 108/62  (!) 126/48 (!) 80/61  Pulse: 87  (!) 123 96  Resp: 19     Temp: (!) 100.5 F (38.1 C) 99.5 F (37.5 C) 98.4 F (36.9 C) 98.2 F (36.8 C)  TempSrc: Oral Oral Oral   SpO2: 93%  99% 99%  Weight:      Height:        Intake/Output Summary (Last 24 hours) at  12/22/2019 1144 Last data filed at 12/22/2019 1003 Gross per 24 hour  Intake 600 ml  Output --  Net 600 ml   Filed Weights   12/14/19 1710  Weight: 81.6 kg    General appearance: Somewhat lethargic but easily arousable.  Answers questions appropriately.   Resp: Clear to auscultation bilaterally.  Normal effort Cardio: S1-S2 is normal regular.  No S3-S4.  No rubs murmurs or bruit GI: Abdomen is soft.  Nontender nondistended.  Bowel sounds are present normal.  No masses organomegaly Extremities: No edema.  Full range of motion of lower extremities. Neurologic: She remains alert and oriented x3.  Tongue is midline.  Motor strength equal bilateral upper and lower extremities.     Lab Results:  Data Reviewed: I have personally reviewed following labs and imaging studies  CBC: Recent Labs  Lab 12/17/19 0626 12/18/19 0429 12/19/19 0552 12/20/19 0416 12/21/19 0610  WBC 10.5 9.8 11.6* 11.5* 11.4*  HGB 12.7 10.7* 10.3* 9.9* 10.3*  HCT 38.4 32.3* 31.6* 30.2* 30.5*  MCV 91.0 92.0 92.1 91.8 91.0  PLT 242 182 175 165 884    Basic Metabolic Panel: Recent Labs  Lab 12/17/19 0626 12/18/19 0429 12/19/19 0552 12/20/19 0416 12/21/19 0610  NA 141 141 141 138 140  K 4.7 3.7 4.1 4.0 5.1  CL 105 106 107 105 107  CO2 28 28 29 27 29   GLUCOSE 131* 96 106* 106* 111*  BUN 25* 26* 22 23 18   CREATININE 0.93 1.02* 0.85 0.91 0.88  CALCIUM 8.9 7.9* 7.7* 7.9* 8.6*    GFR: Estimated Creatinine Clearance: 63.6 mL/min (by C-G formula based on SCr of 0.88 mg/dL).   Recent Results (from the past 240 hour(s))  SARS Coronavirus 2 by RT PCR (hospital order, performed in [facility] hospital lab) Nasopharyngeal Nasopharyngeal Swab     Status: None   Collection Time: 12/15/19 12:35 AM   Specimen: Nasopharyngeal Swab  Result Value Ref Range Status   SARS Coronavirus 2 NEGATIVE NEGATIVE Final    Comment: (NOTE) SARS-CoV-2 target nucleic acids are NOT DETECTED.  The SARS-CoV-2 RNA is generally  detectable in upper and lower respiratory specimens during the acute phase of infection. The lowest concentration of SARS-CoV-2 viral copies this assay can detect is 250 copies / mL. A negative result does not preclude SARS-CoV-2 infection and should not be used as the sole basis for treatment or other patient management decisions.  A negative result may occur with improper specimen  collection / handling, submission of specimen other than nasopharyngeal swab, presence of viral mutation(s) within the areas targeted by this assay, and inadequate number of viral copies (<250 copies / mL). A negative result must be combined with clinical observations, patient history, and epidemiological information.  Fact Sheet for Patients:   StrictlyIdeas.no  Fact Sheet for Healthcare Providers: BankingDealers.co.za  This test is not yet approved or  cleared by the [place] FDA and has been authorized for detection and/or diagnosis of SARS-CoV-2 by FDA under an Emergency Use Authorization (EUA).  This EUA will remain in effect (meaning this test can be used) for the duration of the COVID-19 declaration under Section 564(b)(1) of the Act, 21 U.S.C. section 360bbb-3(b)(1), unless the authorization is terminated or revoked sooner.  Performed at [facility], [address] [place], [place] [address]   Surgical pcr screen     Status: None   Collection Time: 12/16/19  3:57 PM   Specimen: Nasal Mucosa; Nasal Swab  Result Value Ref Range Status   MRSA, PCR NEGATIVE NEGATIVE Final   Staphylococcus aureus NEGATIVE NEGATIVE Final    Comment: (NOTE) The Xpert SA Assay (FDA approved for NASAL specimens in patients 9 years of age and older), is one component of a comprehensive surveillance program. It is not intended to diagnose infection nor to guide or monitor treatment. Performed at [facility], [address]  [place], [place] [address]   SARS Coronavirus 2 by RT PCR (hospital order, performed in [facility] hospital lab) Nasopharyngeal Nasopharyngeal Swab     Status: None   Collection Time: 12/21/19  3:13 PM   Specimen: Nasopharyngeal Swab  Result Value Ref Range Status   SARS Coronavirus 2 NEGATIVE NEGATIVE Final    Comment: (NOTE) SARS-CoV-2 target nucleic acids are NOT DETECTED.  The SARS-CoV-2 RNA is generally detectable in upper and lower respiratory specimens during the acute phase of infection. The lowest concentration of SARS-CoV-2 viral copies this assay can detect is 250 copies / mL. A negative result does not preclude SARS-CoV-2 infection and should not be used as the sole basis for treatment or other patient management decisions.  A negative result may occur with improper specimen collection / handling, submission of specimen other than nasopharyngeal swab, presence of viral mutation(s) within the areas targeted by this assay, and inadequate number of viral copies (<250 copies / mL). A negative result must be combined with clinical observations, patient history, and epidemiological information.  Fact Sheet for Patients:   StrictlyIdeas.no  Fact Sheet for Healthcare Providers: BankingDealers.co.za  This test is not yet approved or  cleared by the [place] FDA and has been authorized for detection and/or diagnosis of SARS-CoV-2 by FDA under an Emergency Use Authorization (EUA).  This EUA will remain in effect (meaning this test can be used) for the duration of the COVID-19 declaration under Section 564(b)(1) of the Act, 21 U.S.C. section 360bbb-3(b)(1), unless the authorization is terminated or revoked sooner.  Performed at [facility], [address], [place], [place] [address]       Radiology Studies: No results found.     LOS: 7 days   Tayler Heiden [employer] on  [email or web address]  12/22/2019, 11:44 AM

## 2019-12-22 NOTE — Progress Notes (Signed)
   12/22/19 1410  Vitals  Temp 98.8 F (37.1 C)  Temp Source Oral  BP 115/61  MAP (mmHg) 76  BP Method Automatic  Pulse Rate 71  Pulse Rate Source Monitor  Oxygen Therapy  SpO2 98 %  MEWS Score  MEWS Temp 0  MEWS Systolic 0  MEWS Pulse 0  MEWS RR 0  MEWS LOC 0  MEWS Score 0  MEWS Score Color Linda Farley

## 2019-12-22 NOTE — Progress Notes (Signed)
   12/22/19 1457  Assess: MEWS Score  Temp 98.3 F (36.8 C)  BP 114/62  Pulse Rate 90  SpO2 97 %  O2 Device Room Air  Assess: MEWS Score  MEWS Temp 0  MEWS Systolic 0  MEWS Pulse 0  MEWS RR 0  MEWS LOC 0  MEWS Score 0  MEWS Score Color Nyoka Cowden

## 2019-12-22 NOTE — Significant Event (Signed)
Rapid Response Event Note  Overview: Time Called: 1119 Arrival Time: 1121 Event Type: Hypotension  Initial Focused Assessment:  Patient is awake, sitting in recliner. Drowsy but responsive. Oriented X 4. BP 80s/60s. HR in 80s. Temp WNL. CBG WNL. Diaphoretic.   Interventions:  - Patient returned to bed with staff X 4. - MD paged to bedside. - 500 mL NS bolus initiated, and confirmed by Dr. Nicki Guadalajara. - Patient placed in supine position.  Plan of Care (if not transferred):  BP has recovered to 100s/60s after initation of fluid bolus and return to bed. Mag level added on to AM labs. Patient to remain in 137 for now. RR-RN to reassess in ~ 1 hour.   Event Summary: Name of Physician Notified: Dr. Bonnielee Haff at 1123    at    Outcome: Stayed in room and stabalized  Event End Time: [place]

## 2019-12-22 NOTE — TOC Progression Note (Signed)
Transition of Care [facility]) - Progression Note    Patient Details  Name: ANNALEI FRIESZ MRN: [id number] Date of Birth: Mar 10, 1951  Transition of Care [facility]) CM/SW Contact  Ranjit Ashurst, Gardiner Rhyme, LCSW Phone Number: 12/22/2019, 8:39 AM  Clinical Narrative:  Have received insurance auth for transfer to [facility] today [place] number [id number]. EMS also approved [id number]. Have let MD and bedside RN aware and will work on transfer today, if medically stable for transfer    Expected Discharge Plan: Home/Self Care Barriers to Discharge: Continued Medical Work up  Expected Discharge Plan and Services Expected Discharge Plan: Home/Self Care In-house Referral: Clinical Social Work   Post Acute Care Choice: Durable Medical Equipment Living arrangements for the past 2 months: Single Family Home                 DME Arranged: [place] rolling DME Agency: [employer] Date DME Agency Contacted: 12/15/19 Time DME Agency Contacted: [phone] Representative spoke with at DME Agency: Zack             Social Determinants of Health ([place]) Interventions    Readmission Risk Interventions No flowsheet data found.

## 2019-12-22 NOTE — Progress Notes (Signed)
   12/22/19 1056  Assess: MEWS Score  Temp 98.4 F (36.9 C)  BP (!) 126/48  Pulse Rate (!) 123  SpO2 99 %  Assess: MEWS Score  MEWS Temp 0  MEWS Systolic 0  MEWS Pulse 2  MEWS RR 0  MEWS LOC 0  MEWS Score 2  MEWS Score Color Yellow  Treat  MEWS Interventions Other (Comment)  Escalate  MEWS: Escalate  (Not escalated)  Notify: Charge Nurse/RN  Name of Charge Nurse/RN Notified Yves Dill  Date Charge Nurse/RN Notified 12/22/19  Time Charge Nurse/RN Notified 1100  Notify: Provider  Provider Name/Title Dr. Maryland Pink  Date Provider Notified 12/22/19  Time Provider Notified 1100  Notification Type Page  Notification Reason Other (Comment) (Pulse tachy and then normal )  Response No new orders  Date of Provider Response 12/22/19  Time of Provider Response 1101  Document  Patient Outcome Other (Comment) (Rechecked VS)

## 2019-12-22 NOTE — Progress Notes (Signed)
Rapid response RN Aleene Davidson called, we transferred patient from Recliner to bed, Placed in Trendelenburg position , Patient is verbal and alert and oriented x 4. This Probation officer called the rapid d/t patients LOC and holding her head to the right and not opening her eyes at time. Looked Pale and diaphoretic . Her CBG is 139.

## 2019-12-22 NOTE — TOC Transition Note (Signed)
Transition of Care [facility]) - CM/SW Discharge Note   Patient Details  Name: Linda Farley MRN: [id number] Date of Birth: 09-25-1950  Transition of Care [facility]) CM/SW Contact:  Elease Hashimoto, LCSW Phone Number: 12/22/2019, 10:32 AM   Clinical Narrative:  Met with pt to inform insurance has auth for her to transfer to [facility] today. Auth number [id number] and EMS approved [phone]. Pt feels ready and hopes rehab will be short. Sister is currently at [employer] completing forms. ONce DC paperwork complete will work on transfer. Bedside RN aware of plan and will call report to [phone]     Final next level of care: Skilled Nursing Facility Barriers to Discharge: Barriers Resolved   Patient Goals and CMS Choice Patient states their goals for this hospitalization and ongoing recovery are:: I need hip replacement surgery I hope when orthro comes in to see me he can get me in for surgery CMS Medicare.gov Compare Post Acute Care list provided to:: Other (Comment Required) (Kay-sister) Choice offered to / list presented to : Patient  Discharge Placement PASRR number recieved: 12/18/19            Patient chooses bed at: Other - please specify in the comment section below: ([facility] [place]) Patient to be transferred to facility by: EMS Name of family member notified: Kay-sister Patient and family notified of of transfer: 12/22/19  Discharge Plan and Services In-house Referral: Clinical Social Work   Post Acute Care Choice: Durable Medical Equipment          DME Arranged: Gilford Rile rolling DME Agency: [employer] Date DME Agency Contacted: 12/15/19 Time DME Agency Contacted: [phone] Representative spoke with at DME Agency: Zack            Social Determinants of Health ([place]) Interventions     Readmission Risk Interventions No flowsheet data found.

## 2019-12-22 NOTE — Progress Notes (Signed)
   12/22/19 1100  Vitals  Temp 98.2 F (36.8 C)  BP (!) 80/61  Pulse Rate 96  Level of Consciousness  Level of Consciousness Alert  Oxygen Therapy  SpO2 99 %  MEWS Score  MEWS Temp 0  MEWS Systolic 2  MEWS Pulse 0  MEWS RR 0  MEWS LOC 0  MEWS Score 2  MEWS Score Color Yellow  Rapid response called. Patient started on Bolus Dr. Maryland Pink in to assess patient she is verbal , however she doesn't open her eyes d/t dizziness, and keeps her head directly to the right NIH score is 1 , Placed in trendelenburg . BS 139/. Dr. Maryland Pink to place new order for CT scan

## 2019-12-22 NOTE — Progress Notes (Signed)
   Subjective: 5 Days Post-Op Procedure(s) (LRB): TOTAL HIP ARTHROPLASTY ANTERIOR APPROACH (Right) Patient reports pain as mild.   Patient is well, and has had no acute complaints or problems Denies any CP, SOB, ABD pain. We will continue therapy today.  Plan is to go Skilled nursing facility after hospital stay.  Objective: Vital signs in last 24 hours: Temp:  [98.3 F (36.8 C)-100.5 F (38.1 C)] 99.5 F (37.5 C) (07/13 0900) Pulse Rate:  [76-102] 87 (07/13 0723) Resp:  [16-19] 19 (07/13 0723) BP: (107-131)/(56-72) 108/62 (07/13 0723) SpO2:  [93 %-99 %] 93 % (07/13 0723)  Intake/Output from previous day: 07/12 0701 - 07/13 0700 In: 360 [P.O.:360] Out: -  Intake/Output this shift: No intake/output data recorded.  Recent Labs    12/20/19 0416 12/21/19 0610  HGB 9.9* 10.3*   Recent Labs    12/20/19 0416 12/21/19 0610  WBC 11.5* 11.4*  RBC 3.29* 3.35*  HCT 30.2* 30.5*  PLT 165 202   Recent Labs    12/20/19 0416 12/21/19 0610  NA 138 140  K 4.0 5.1  CL 105 107  CO2 27 29  BUN 23 18  CREATININE 0.91 0.88  GLUCOSE 106* 111*  CALCIUM 7.9* 8.6*   No results for input(s): LABPT, INR in the last 72 hours.  EXAM General - Patient is Alert, Appropriate and Oriented Extremity - Neurovascular intact Sensation intact distally Intact pulses distally Dorsiflexion/Plantar flexion intact No cellulitis present Compartment soft Dressing - dressing C/D/I and no drainage, prevena intact with out drainage Motor Function - intact, moving foot and toes well on exam.   Past Medical History:  Diagnosis Date  . Allergy   . Anxiety   . Arthritis   . Depression   . Hypertension     Assessment/Plan:   5 Days Post-Op Procedure(s) (LRB): TOTAL HIP ARTHROPLASTY ANTERIOR APPROACH (Right) Active Problems:   Intractable low back pain  Estimated body mass index is 29.95 kg/m as calculated from the following:   Height as of this encounter: 5\' 5"  (1.651 m).   Weight as  of this encounter: 81.6 kg. Advance diet Up with therapy  Pain well controlled VSS, low grade temp. Encouraged incentive spirometer   Lovenox 40 mg SQ daily x 14 days at discharge TED hose BLE x 6 weeks at discharge Follow up with [facility] in 2 weeks   DVT Prophylaxis - Aspirin, Lovenox and SCDS Weight-Bearing as tolerated to right leg   T. Rachelle Hora, PA-C [place] 12/22/2019, 9:48 AM

## 2019-12-22 NOTE — Progress Notes (Signed)
Rechecked patients pulse it is 96 Room air

## 2019-12-22 NOTE — Progress Notes (Signed)
Patients T 100.5 encouraged incentive , denies feeling feverish or having chills at this time

## 2019-12-22 NOTE — Progress Notes (Signed)
Post Bolus   12/22/19 1230  Vitals  Temp 98.2 F (36.8 C)  Temp Source Oral  BP (!) 82/47  MAP (mmHg) (!) 59  BP Location Right Arm  BP Method Automatic  Patient Position (if appropriate) Lying  Pulse Rate 74  Resp 16  Oxygen Therapy  SpO2 100 %  O2 Device Room Air  MEWS Score  MEWS Temp 0  MEWS Systolic 1  MEWS Pulse 0  MEWS RR 0  MEWS LOC 0  MEWS Score 1  MEWS Score Color Green

## 2019-12-22 NOTE — TOC Progression Note (Signed)
Transition of Care [facility]) - Progression Note    Patient Details  Name: Linda Farley MRN: [id number] Date of Birth: 13-Oct-1950  [facility]) CM/SW Contact  Lajuane Leatham, Gardiner Rhyme, LCSW Phone Number: 12/22/2019, 3:54 PM  Clinical Narrative:   Met with pt and sister who is in the room, pt is back to baseline function. Aware of plan for tomorrow and will see in am. Chris[facility] and [facility]Crystal aware of delay in DC until tomorrow due to medical issues.    Expected Discharge Plan: Home/Self Care Barriers to Discharge: Barriers Resolved  Expected Discharge Plan and Services Expected Discharge Plan: Home/Self Care In-house Referral: Clinical Social Work   Post Acute Care Choice: Durable Medical Equipment Living arrangements for the past 2 months: Single Family Home                 DME Arranged: Walker rolling DME Agency: [employer] Date DME Agency Contacted: 12/15/19 Time DME Agency Contacted: [phone] Representative spoke with at DME Agency: Zack             Social Determinants of Health ([place]) Interventions    Readmission Risk Interventions No flowsheet data found.

## 2019-12-22 NOTE — TOC Progression Note (Addendum)
Transition of Care [facility]) - Progression Note    Patient Details  Name: AUDYN DIMERCURIO MRN: [id number] Date of Birth: 1950-09-22  Transition of [facility]) CM/SW Contact  Moni Rothrock, Gardiner Rhyme, LCSW Phone Number: 12/22/2019, 12:03 PM  Clinical Narrative:  Will await medical stability for pt to transfer see the rapid response note. Once hear will notify facility regarding plan.   1:10 pm Contacted Ricki[facility] and Crystal-[facility] regarding not discharging today due to medical issues  Expected Discharge Plan: Home/Self Care Barriers to Discharge: Barriers Resolved  Expected Discharge Plan and Services Expected Discharge Plan: Home/Self Care In-house Referral: Clinical Social Work   Post Acute Care Choice: Durable Medical Equipment Living arrangements for the past 2 months: Single Family Home                 DME Arranged: Walker rolling DME Agency: [employer] Date DME Agency Contacted: 12/15/19 Time DME Agency Contacted: [phone] Representative spoke with at DME Agency: Zack             Social Determinants of Health ([place]) Interventions    Readmission Risk Interventions No flowsheet data found.

## 2019-12-22 NOTE — Progress Notes (Signed)
PT Cancellation Note  Patient Details Name: Linda Farley MRN: [id number] DOB: [birth year]   Cancelled Treatment:    Reason Eval/Treat Not Completed: Medical issues which prohibited therapy   Pt in recliner with nursing 11:09.  Reports dizziness 8/10 this am along with nausea which increases looking left.  Pt holding head to right and unable to look left.  Awaiting MD.  Returned and MD in room.  Pt back to bed and will have testing this pm.  Will hold therapy at this time and anticipate continued therapy tomorrow.   Chesley Noon 12/22/2019, 11:42 AM

## 2019-12-22 NOTE — Progress Notes (Signed)
Patient returned from CT , her VS were as follows will notify Dr. [place] Pink, she is more alert at this time. Sister at bedside

## 2019-12-22 NOTE — Significant Event (Signed)
Rapid Response Follow-up:  - Patient remains hypotensive following 500 mL bolus. - A second 500 mL bolus is infusing at this time.  - Will reassess BP after second bolus.

## 2019-12-23 DIAGNOSIS — Z7401 Bed confinement status: Secondary | ICD-10-CM | POA: Diagnosis not present

## 2019-12-23 DIAGNOSIS — M2550 Pain in unspecified joint: Secondary | ICD-10-CM | POA: Diagnosis not present

## 2019-12-23 DIAGNOSIS — D62 Acute posthemorrhagic anemia: Secondary | ICD-10-CM

## 2019-12-23 DIAGNOSIS — R262 Difficulty in walking, not elsewhere classified: Secondary | ICD-10-CM | POA: Diagnosis not present

## 2019-12-23 DIAGNOSIS — I82401 Acute embolism and thrombosis of unspecified deep veins of right lower extremity: Secondary | ICD-10-CM | POA: Diagnosis not present

## 2019-12-23 DIAGNOSIS — M6281 Muscle weakness (generalized): Secondary | ICD-10-CM | POA: Diagnosis not present

## 2019-12-23 DIAGNOSIS — Z23 Encounter for immunization: Secondary | ICD-10-CM | POA: Diagnosis not present

## 2019-12-23 DIAGNOSIS — Z96641 Presence of right artificial hip joint: Secondary | ICD-10-CM | POA: Diagnosis not present

## 2019-12-23 DIAGNOSIS — Z471 Aftercare following joint replacement surgery: Secondary | ICD-10-CM | POA: Diagnosis not present

## 2019-12-23 DIAGNOSIS — R2689 Other abnormalities of gait and mobility: Secondary | ICD-10-CM | POA: Diagnosis not present

## 2019-12-23 DIAGNOSIS — M545 Low back pain: Secondary | ICD-10-CM | POA: Diagnosis not present

## 2019-12-23 DIAGNOSIS — R296 Repeated falls: Secondary | ICD-10-CM | POA: Diagnosis not present

## 2019-12-23 DIAGNOSIS — Z9181 History of falling: Secondary | ICD-10-CM | POA: Diagnosis not present

## 2019-12-23 DIAGNOSIS — M549 Dorsalgia, unspecified: Secondary | ICD-10-CM | POA: Diagnosis not present

## 2019-12-23 DIAGNOSIS — G8929 Other chronic pain: Secondary | ICD-10-CM | POA: Diagnosis not present

## 2019-12-23 DIAGNOSIS — Z736 Limitation of activities due to disability: Secondary | ICD-10-CM | POA: Diagnosis not present

## 2019-12-23 DIAGNOSIS — I6990 Unspecified sequelae of unspecified cerebrovascular disease: Secondary | ICD-10-CM | POA: Diagnosis not present

## 2019-12-23 DIAGNOSIS — R41841 Cognitive communication deficit: Secondary | ICD-10-CM | POA: Diagnosis not present

## 2019-12-23 LAB — COMPREHENSIVE METABOLIC PANEL
ALT: 14 U/L (ref 0–44)
AST: 16 U/L (ref 15–41)
Albumin: 2.5 g/dL — ABNORMAL LOW (ref 3.5–5.0)
Alkaline Phosphatase: 61 U/L (ref 38–126)
Anion gap: 6 (ref 5–15)
BUN: 14 mg/dL (ref 8–23)
CO2: 29 mmol/L (ref 22–32)
Calcium: 8.2 mg/dL — ABNORMAL LOW (ref 8.9–10.3)
Chloride: 106 mmol/L (ref 98–111)
Creatinine, Ser: 0.83 mg/dL (ref 0.44–1.00)
GFR calc Af Amer: >60 mL/min (ref >60)
GFR calc non Af Amer: >60 mL/min (ref >60)
Glucose, Bld: 106 mg/dL — ABNORMAL HIGH (ref 70–99)
Potassium: 4.5 mmol/L (ref 3.5–5.1)
Sodium: 141 mmol/L (ref 135–145)
Total Bilirubin: 0.6 mg/dL (ref 0.3–1.2)
Total Protein: 5.5 g/dL — ABNORMAL LOW (ref 6.5–8.1)

## 2019-12-23 LAB — CBC
HCT: 29.9 % — ABNORMAL LOW (ref 36.0–46.0)
Hemoglobin: 9.8 g/dL — ABNORMAL LOW (ref 12.0–15.0)
MCH: 30.3 pg (ref 26.0–34.0)
MCHC: 32.8 g/dL (ref 30.0–36.0)
MCV: 92.6 fL (ref 80.0–100.0)
Platelets: 245 10*3/uL (ref 150–400)
RBC: 3.23 MIL/uL — ABNORMAL LOW (ref 3.87–5.11)
RDW: 13.9 % (ref 11.5–15.5)
WBC: 8.9 10*3/uL (ref 4.0–10.5)
nRBC: 0 % (ref 0.0–0.2)

## 2019-12-23 MED ORDER — GABAPENTIN 300 MG PO CAPS: 600.0000 mg | ORAL_CAPSULE | Freq: Every day | ORAL | 0 refills | Status: DC

## 2019-12-23 MED ORDER — ENOXAPARIN SODIUM 80 MG/0.8ML ~~LOC~~ SOLN: 80.0000 mg | Freq: Two times a day (BID) | SUBCUTANEOUS | 0 refills | Status: DC

## 2019-12-23 MED ORDER — MECLIZINE HCL 12.5 MG PO TABS: 12.5000 mg | ORAL_TABLET | Freq: Three times a day (TID) | ORAL | 0 refills | Status: DC

## 2019-12-23 MED ORDER — ATORVASTATIN CALCIUM 20 MG PO TABS: 20.0000 mg | ORAL_TABLET | Freq: Every day | ORAL | 0 refills | Status: DC

## 2019-12-23 NOTE — Discharge Summary (Signed)
Physician Discharge Summary  AQUINNAH Farley FYB:[id number] DOB: 1951-04-01 DOA: 12/14/2019  PCP: [name], FNP  Admit date: 12/14/2019 Discharge date: 12/23/2019  Admitted From: home Disposition:  SNF  Recommendations for Outpatient Follow-up:  1. Follow up with PCP in 1-2 weeks 2. F/u ortho surgery, Dr. Rudene Christians, in 2 weeks 3. F/u neuro in 1 week 4. F/u opthalmology w/in 1 week  Home Health: no  Equipment/Devices:  Discharge Condition: stable  CODE STATUS: full  Diet recommendation: Heart Healthy  Brief/Interim Summary: HPI was taken from Dr. Sidney Ace: Linda Farley  is a 69 y.o. Caucasian female with a known history of hypertension, depression, spinal stenosis with chronic low back pain, who presented to the emergency room with acute onset of worsening low back pain which has been intractable as well as right hip pain with radiation to her right leg with associated right leg weakness and burning without tingling or numbness.  She denies any fever or chills.  No dysuria, oliguria or hematuria or flank pain.  No urinary or stool incontinence.  No nausea vomiting or abdominal pain.  She tells me she has an appointment for discussion about right hip arthroplasty with Dr. [name] in [place]  clinic coming soon.  Upon presentation to the emergency room, vital signs were within normal except for temperature 99.5 that was later on 90.4.  Blood pressure later on was up to 160/82 and later 141/72.  Labs revealed slight level BUN of 29 with a creatinine of 1.08, high-sensitivity troponin I of 5 and therefore with unremarkable CBC.  COVID-19 PCR came back negative.  UA was unremarkable.EKG showed normal sinus rhythm with a rate of 97 with right bundle branch block. Two-view chest x-ray showed no acute cardiopulmonary disease.  The patient was given 10 mg of IV Decadron and 1 p.o. Percocet.  She will be admitted to a medical bed for further evaluation and management.  Hospital Course from Dr. Maryland Pink:  69 y.o.Caucasian femalewith a known history of hypertension, depression, spinal stenosis with chronic low back pain, who presented to the emergency room with acute onset of worsening low back pain which has been intractable as well as right hip pain with radiation to her right leg with associated right leg weakness and burning without tingling or numbness. She has an appointment for discussion about right hip arthroplasty with Dr. [name] in [place] clinic coming soon.  Patient was unable to weight-bear and ambulate.  She was hospitalized.  Neurosurgery as well as orthopedic surgery was consulted.  Please see below for further information as well as previous progress notes     Discharge Diagnoses:  Active Problems:   Intractable low back pain  Hypotension: etiology unclear. Resolved   Acute DVT right lower extremity: continue on full dose lovenox for at least 3 months  Intractable right hip pain: with inability to bear weight and ambulate. CT scan which does not show any fractures in that area.  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 TED hose bilateral lower extremities for 6 weeks.  Follow-up with orthopedics in 2 weeks.  Acute CVA: developed double vision early morning on 12/18/19.  MRI brain shows evidence for acute infarct in the left lobe. Continue on aspirin.  Echocardiogram did not show any acute findings, bubble study was negative.  CT angiogram of the head and neck does not show any significant stenosis. No further work-up per neurology.  PT OT evaluation.  SNF was recommended.  Will need outpatient ophthalmology.  Fever: noted to be febrile  on 7/10.  Infectious work-up has been negative including chest x-ray and UA.  Could be postprocedural.  Resolved  Palpitations: EKG did not show any acute findings.  Thyroid function test back in March were normal.   Acute blood loss anemia: drop in hemoglobin likely due to surgery.  No other overt bleeding noted.  Continue to monitor. No  transfusion needed at this time. Will continue to monitor   Chronic low back pain with radiculopathy: seen by neurosurgery.  MRI of the lumbar spine was done which does not show any acute findings.  S/p  intravenous dexamethasone for a few days. No plans for any surgical intervention currently.  Follow-up with her physiatrist.   Essential hypertension: WNL currently. Will continue to monitor   Peripheral neuropathy: continue with gabapentin.  History of depression: severity unknown. Continue with citalopram  Rash in the upper back: maculopapular rash was noted.  Very itchy per patient.  Started on steroid cream with improvement.   Discharge Instructions  Discharge Instructions    Diet - low sodium heart healthy   Complete by: As directed    Discharge instructions   Complete by: As directed    F/u PCP in 1-2 weeks. F/u ophthalmology w/in 1 week. F/u ortho surg (Dr. Rudene Christians) in 2 weeks. F/u neuro in 1-2 weeks   Discharge wound care:   Complete by: As directed    As per ortho surg notes   Increase activity slowly   Complete by: As directed      Allergies as of 12/23/2019      Reactions   Chlorhexidine    Penicillins Rash      Medication List    STOP taking these medications   meloxicam 15 MG tablet Commonly known as: MOBIC   predniSONE 10 MG tablet Commonly known as: DELTASONE   Turmeric 1053 MG Tabs     TAKE these medications   albuterol 108 (90 Base) MCG/ACT inhaler Commonly known as: VENTOLIN HFA INHALE 2 PUFFS BY MOUTH EVERY 6 HOURS AS NEEDED FOR WHEEZING OR SHORTNESS OF BREATH   atorvastatin 20 MG tablet Commonly known as: LIPITOR Take 1 tablet (20 mg total) by mouth daily. Start taking on: December 24, 2019   citalopram 20 MG tablet Commonly known as: CELEXA Take 1 tablet by mouth once daily   D3 Super Strength 50 MCG (2000 UT) Caps Generic drug: Cholecalciferol Take by mouth.   enoxaparin 80 MG/0.8ML injection Commonly known as: LOVENOX Inject 0.8  mLs (80 mg total) into the skin every 12 (twelve) hours.   gabapentin 300 MG capsule Commonly known as: NEURONTIN Take 300 mg by mouth as directed. One capsule in the morning and two capsules every day at bedtime What changed: Another medication with the same name was added. Make sure you understand how and when to take each.   gabapentin 300 MG capsule Commonly known as: NEURONTIN Take 2 capsules (600 mg total) by mouth at bedtime. What changed: You were already taking a medication with the same name, and this prescription was added. Make sure you understand how and when to take each.   HYDROcodone-acetaminophen 5-325 MG tablet Commonly known as: Norco Take 1-2 tablets by mouth every 4 (four) hours as needed for moderate pain.   lisinopril-hydrochlorothiazide 20-25 MG tablet Commonly known as: ZESTORETIC Take 1 tablet by mouth once daily   Magnesium 500 MG Tabs Take 1 tablet by mouth daily.   meclizine 12.5 MG tablet Commonly known as: ANTIVERT Take 1 tablet (12.5 mg  total) by mouth 3 (three) times daily.   traMADol 50 MG tablet Commonly known as: ULTRAM Take 50 mg by mouth 3 (three) times daily as needed.            Durable Medical Equipment  (From admission, onward)         Start     Ordered   12/17/19 1716  DME Walker rolling  Once       Question Answer Comment  Walker: With [place] Wheels   Patient needs a walker to treat with the following condition S/P hip replacement      12/17/19 1715   12/17/19 1716  DME 3 n 1  Once        12/17/19 1715   12/17/19 1716  DME Bedside commode  Once       Question:  Patient needs a bedside commode to treat with the following condition  Answer:  S/P hip replacement   12/17/19 1715           Discharge Care Instructions  (From admission, onward)         Start     Ordered   12/23/19 0000  Discharge wound care:       Comments: As per ortho surg notes   12/23/19 1153          Contact information for follow-up  providers    Hessie Knows, MD Follow up in 6 week(s).   Specialty: Orthopedic Surgery Why: Repeat x-rays. Contact information: [place] [address] [phone]            Contact information for after-discharge care    Destination    [facility] .   Service: Skilled Nursing Contact information: [address] [place] [phone]                 Allergies  Allergen Reactions  . Chlorhexidine   . Penicillins Rash    Consultations:  Dr. Rudene Christians, ortho surg  Dr. Doy Mince, neuro   Procedures/Studies: CT ANGIO HEAD W OR WO CONTRAST  Result Date: 12/18/2019 CLINICAL DATA:  Acute onset of diplopia and headache today. Small acute infarction question in the left frontal lobe. EXAM: CT ANGIOGRAPHY HEAD AND NECK TECHNIQUE: Multidetector CT imaging of the head and neck was performed using the standard protocol during bolus administration of intravenous contrast. Multiplanar CT image reconstructions and MIPs were obtained to evaluate the vascular anatomy. Carotid stenosis measurements (when applicable) are obtained utilizing NASCET criteria, using the distal internal carotid diameter as the denominator. CONTRAST:  13mL OMNIPAQUE IOHEXOL 350 MG/ML SOLN COMPARISON:  CT and MRI same day FINDINGS: CTA NECK FINDINGS Aortic arch: Normal. No atherosclerotic disease. Normal branching pattern without origin stenosis. Right carotid system: Common carotid artery widely patent to the bifurcation. Carotid bifurcation and ICA bulb are normal. Cervical ICA is normal. Left carotid system: Common carotid artery widely patent to the bifurcation. Carotid bifurcation and ICA bulb are normal. Cervical ICA is normal. Vertebral arteries: Both vertebral artery origins widely patent. Both vertebral arteries widely patent and normal through the cervical region to the foramen magnum. Skeleton: Ordinary  spondylosis C5-6. Other neck: No soft tissue mass or lymphadenopathy. Upper chest: Negative Review of the MIP images confirms the above findings CTA HEAD FINDINGS Anterior circulation: Both internal carotid arteries widely patent through the skull base and siphon regions. No siphon atherosclerotic disease or stenosis. The anterior and middle cerebral vessels  are patent without proximal stenosis, aneurysm or vascular malformation. No large or medium vessel occlusion. Posterior circulation: Both vertebral arteries widely patent to the basilar. No basilar stenosis. Posterior circulation branch vessels are normal. Venous sinuses: Patent and normal. Anatomic variants: None significant. Review of the MIP images confirms the above findings IMPRESSION: No visible atherosclerotic disease anywhere in the region studied. No intracranial vessel occlusion. No carotid bifurcation stenosis. Electronically Signed   By: Nelson Chimes M.D.   On: 12/18/2019 16:02   DG Chest 2 View  Result Date: 12/14/2019 CLINICAL DATA:  Shortness of breath and chest pain EXAM: CHEST - 2 VIEW COMPARISON:  None. FINDINGS: Cardiac shadows within normal limits. The lungs are well aerated bilaterally. No focal infiltrate or effusion is seen. No acute bony abnormality is noted. IMPRESSION: No acute abnormality noted. Electronically Signed   By: Inez Catalina M.D.   On: 12/14/2019 19:43   CT HEAD WO CONTRAST  Result Date: 12/22/2019 CLINICAL DATA:  Encephalopathy.  Double vision beginning 3 days ago. EXAM: CT HEAD WITHOUT CONTRAST TECHNIQUE: Contiguous axial images were obtained from the base of the skull through the vertex without intravenous contrast. COMPARISON:  MRI 12/18/2019.  CT studies 12/18/2019. FINDINGS: Brain: The brain shows a normal appearance without evidence of malformation, atrophy, old or acute small or large vessel infarction, mass lesion, hemorrhage, hydrocephalus or extra-axial collection. Vascular: No hyperdense vessel. No  evidence of atherosclerotic calcification. Skull: Normal.  No traumatic finding.  No focal bone lesion. Sinuses/Orbits: Sinuses are clear. Orbits appear normal. Mastoids are clear. Other: None significant IMPRESSION: Continued normal appearance by CT. Electronically Signed   By: Nelson Chimes M.D.   On: 12/22/2019 13:28   CT HEAD WO CONTRAST  Result Date: 12/18/2019 CLINICAL DATA:  Acute onset of double vision EXAM: CT HEAD WITHOUT CONTRAST TECHNIQUE: Contiguous axial images were obtained from the base of the skull through the vertex without intravenous contrast. COMPARISON:  None. FINDINGS: Brain: No evidence of acute infarction, hemorrhage, hydrocephalus, extra-axial collection or mass lesion/mass effect. Grossly negative cisterns and cavernous sinus region. Vascular: No hyperdense vessel or unexpected calcification. Skull: Normal. Negative for fracture or focal lesion. Sinuses/Orbits: No acute finding. The covered orbits shows no visible inflammation or mass. IMPRESSION: Negative head CT. Electronically Signed   By: Monte Fantasia M.D.   On: 12/18/2019 06:37   CT ANGIO NECK W OR WO CONTRAST  Result Date: 12/18/2019 CLINICAL DATA:  Acute onset of diplopia and headache today. Small acute infarction question in the left frontal lobe. EXAM: CT ANGIOGRAPHY HEAD AND NECK TECHNIQUE: Multidetector CT imaging of the head and neck was performed using the standard protocol during bolus administration of intravenous contrast. Multiplanar CT image reconstructions and MIPs were obtained to evaluate the vascular anatomy. Carotid stenosis measurements (when applicable) are obtained utilizing NASCET criteria, using the distal internal carotid diameter as the denominator. CONTRAST:  14mL OMNIPAQUE IOHEXOL 350 MG/ML SOLN COMPARISON:  CT and MRI same day FINDINGS: CTA NECK FINDINGS Aortic arch: Normal. No atherosclerotic disease. Normal branching pattern without origin stenosis. Right carotid system: Common carotid artery  widely patent to the bifurcation. Carotid bifurcation and ICA bulb are normal. Cervical ICA is normal. Left carotid system: Common carotid artery widely patent to the bifurcation. Carotid bifurcation and ICA bulb are normal. Cervical ICA is normal. Vertebral arteries: Both vertebral artery origins widely patent. Both vertebral arteries widely patent and normal through the cervical region to the foramen magnum. Skeleton: Ordinary spondylosis C5-6. Other neck: No soft tissue  mass or lymphadenopathy. Upper chest: Negative Review of the MIP images confirms the above findings CTA HEAD FINDINGS Anterior circulation: Both internal carotid arteries widely patent through the skull base and siphon regions. No siphon atherosclerotic disease or stenosis. The anterior and middle cerebral vessels are patent without proximal stenosis, aneurysm or vascular malformation. No large or medium vessel occlusion. Posterior circulation: Both vertebral arteries widely patent to the basilar. No basilar stenosis. Posterior circulation branch vessels are normal. Venous sinuses: Patent and normal. Anatomic variants: None significant. Review of the MIP images confirms the above findings IMPRESSION: No visible atherosclerotic disease anywhere in the region studied. No intracranial vessel occlusion. No carotid bifurcation stenosis. Electronically Signed   By: Nelson Chimes M.D.   On: 12/18/2019 16:02   CT ANGIO CHEST PE W OR WO CONTRAST  Result Date: 12/22/2019 CLINICAL DATA:  Hypotension/shock post recent surgery. Clinical concern for pulmonary embolism. EXAM: CT ANGIOGRAPHY CHEST WITH CONTRAST TECHNIQUE: Multidetector CT imaging of the chest was performed using the standard protocol during bolus administration of intravenous contrast. Multiplanar CT image reconstructions and MIPs were obtained to evaluate the vascular anatomy. CONTRAST:  7mL OMNIPAQUE IOHEXOL 350 MG/ML SOLN COMPARISON:  Radiographs 12/19/2019.  No previous relevant CT.  FINDINGS: Cardiovascular: The pulmonary arteries are well opacified with contrast to the level of the subsegmental branches. There is no evidence of acute pulmonary embolism. The aorta is well opacified as well, and no significant systemic arterial abnormalities are seen. The heart is mildly enlarged. There is a small amount of fluid within the superior pericardial recess. Mediastinum/Nodes: There are no enlarged mediastinal, hilar or axillary lymph nodes. The thyroid gland, trachea and esophagus demonstrate no significant findings. Lungs/Pleura: No pleural effusion or pneumothorax. Dependent atelectasis at both lung bases. There are patchy ground-glass opacities in the upper lobes which could reflect mild edema. There is no confluent airspace opacity or suspicious pulmonary nodule. Upper abdomen: No significant findings in the visualized upper abdomen. Musculoskeletal/Chest wall: There is no chest wall mass or suspicious osseous finding. There is a healing fracture of the right 5th rib anteriorly (image 58/6). Mild degenerative changes are present within the spine. Advanced glenohumeral degenerative changes are present bilaterally. Bilateral breast implants are noted. Review of the MIP images confirms the above findings. IMPRESSION: 1. No evidence of acute pulmonary embolism or other acute vascular findings. 2. Patchy ground-glass opacities in the upper lobes could reflect mild edema. Dependent atelectasis at both lung bases. No consolidation. 3. Healing fracture of the right 5th rib anteriorly. Electronically Signed   By: Richardean Sale M.D.   On: 12/22/2019 13:36   CT Lumbar Spine Wo Contrast  Result Date: 12/14/2019 CLINICAL DATA:  Initial evaluation for low back pain, recent fall. EXAM: CT LUMBAR SPINE WITHOUT CONTRAST TECHNIQUE: Multidetector CT imaging of the lumbar spine was performed without intravenous contrast administration. Multiplanar CT image reconstructions were also generated. COMPARISON:  Prior  study from 09/17/2011. FINDINGS: Segmentation: Standard. Lowest well-formed disc space labeled the L5-S1 level. Alignment: 5 mm facet mediated anterolisthesis of L4 on L5. Underlying moderate levoscoliosis with apex at L2-3. Vertebral bodies otherwise normally aligned with preservation of the normal lumbar lordosis. Vertebrae: Vertebral body height maintained without evidence for acute or chronic fracture. Visualized sacrum and pelvis intact. SI joints approximated. No discrete or worrisome osseous lesions. Paraspinal and other soft tissues: Paraspinous soft tissues demonstrate no acute finding. Mild aortic atherosclerosis. Visualized visceral structures within normal limits. Disc levels: L1-2: Minimal disc bulge with bilateral facet hypertrophy. No canal  or foraminal stenosis. L2-3: Chronic intervertebral disc space narrowing with diffuse disc bulge and disc desiccation. Prominent right-sided reactive endplate changes with marginal endplate osteophytic spurring. Moderate bilateral facet hypertrophy. Resultant moderate right lateral recess stenosis, with moderate right L2 foraminal narrowing. Left neural foramen remains widely patent. L3-4: Diffuse disc bulge, eccentric to the right. Moderate facet and ligament flavum hypertrophy. Resultant moderate canal with right greater than left lateral recess stenosis. Mild bilateral L3 foraminal narrowing, also greater on the right. L4-5: 5 mm anterolisthesis, chronic and facet mediated. Associated degenerative intervertebral disc space narrowing with disc occasion and broad posterior pseudo disc bulge/uncovering. Severe bilateral facet arthrosis. Resultant severe spinal stenosis. Moderate left with mild right L4 foraminal narrowing. L5-S1: Diffuse disc bulge with disc desiccation. Disc bulging eccentric to the left. Moderate bilateral facet hypertrophy. No significant canal or lateral recess stenosis. Mild left L5 foraminal narrowing. IMPRESSION: 1. No acute traumatic injury  within the lumbar spine. 2. 5 mm facet mediated anterolisthesis of L4 on L5 with resultant severe spinal stenosis. 3. Multifactorial degenerative changes at L2-3 and L3-4 with resultant moderate right lateral recess and foraminal stenosis at L2-3, with moderate spinal stenosis at L3-4. 4. Underlying moderate levoscoliosis. Electronically Signed   By: Jeannine Boga M.D.   On: 12/14/2019 23:47   MR BRAIN WO CONTRAST  Result Date: 12/18/2019 CLINICAL DATA:  Stroke, follow-up EXAM: MRI HEAD WITHOUT CONTRAST TECHNIQUE: Multiplanar, multiecho pulse sequences of the brain and surrounding structures were obtained without intravenous contrast. COMPARISON:  Head CT 12/18/2019 FINDINGS: Brain: Cerebral volume is normal for age. There is a punctate focus of diffusion weighted hyperintensity within the anterior left frontal lobe (series 5, image 25). This is too small to accurately character on the ADC map. This may reflect a punctate acute infarct or artifact. No evidence of acute infarct elsewhere within the brain. A few scattered small foci of T2/FLAIR hyperintensity within the cerebral white matter are nonspecific, but may reflect minimal changes of chronic small vessel ischemic disease. 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 No evidence of intracranial mass. No chronic intracranial blood products. No extra-axial fluid collection. No midline shift. Vascular: Expected proximal arterial flow voids. Skull and upper cervical spine: No focal marrow lesion Sinuses/Orbits: Visualized orbits show no acute finding. Mild ethmoid sinus mucosal thickening. No significant mastoid effusion. IMPRESSION: Punctate acute infarct versus artifact within the anterior left frontal lobe. No evidence of acute infarct elsewhere within the brain. A few small scattered foci of T2 hyperintensity within the cerebral white matter are nonspecific, but may reflect minimal chronic small vessel ischemic changes.  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 Electronically Signed   By: Kellie Simmering DO   On: 12/18/2019 09:04   MR LUMBAR SPINE WO CONTRAST  Result Date: 12/15/2019 CLINICAL DATA:  Initial evaluation for acute low back pain. EXAM: MRI LUMBAR SPINE WITHOUT CONTRAST TECHNIQUE: Multiplanar, multisequence MR imaging of the lumbar spine was performed. No intravenous contrast was administered. COMPARISON:  Prior CT from 12/14/2019. FINDINGS: Segmentation:  Standard. Alignment: 5 mm facet mediated anterolisthesis of L4 on L5. Trace 2 mm retrolisthesis of L2 on L3. Underlying moderate levoscoliosis with apex at L2-3. Alignment otherwise normal with preservation of the normal lumbar lordosis. Vertebrae: Vertebral body height maintained without evidence for acute or chronic fracture. Bone marrow signal intensity within normal limits. Few scattered benign hemangiomata noted. No worrisome osseous lesions. Mild reactive endplate changes present about the L2-3, L4-5, and  L5-S1 interspaces. No abnormal marrow edema. Conus medullaris and cauda equina: Conus extends to the L1 level. Conus and cauda equina appear normal. Paraspinal and other soft tissues: Paraspinous soft tissues within normal limits. Visualized visceral structures are normal. Disc levels: L1-2: Disc desiccation with mild annular disc bulge. Superimposed small left foraminal to extraforaminal disc protrusion contacts the exiting left L1 nerve root as it courses of the left neural foramen (series 13, image 4). No significant spinal stenosis. Mild left foraminal narrowing. L2-3: Trace retrolisthesis. Chronic intervertebral disc space narrowing with diffuse disc bulge and disc desiccation. Disc bulging eccentric to the right with associated prominent right-sided reactive endplate changes. Superimposed moderate right greater than left facet hypertrophy. Resultant mild canal with moderate right lateral  recess stenosis, with moderate right L2 foraminal narrowing. L3-4: Diffuse disc bulge with disc desiccation. Superimposed right foraminal disc protrusion closely approximates the exiting right L3 nerve root as it courses of the right neural foramen (series 10, image 16). Moderate facet arthrosis. Resultant moderate canal with right worse than left lateral recess stenosis. Mild bilateral L3 foraminal stenosis. L4-5: 5 mm anterolisthesis. Chronic intervertebral disc space narrowing with disc desiccation. Broad-based posterior pseudo disc bulge/uncovering, eccentric to the left. Severe facet and ligament flavum hypertrophy. Associated trace bilateral joint effusions. Resultant severe spinal stenosis. Thecal sac measures 3 mm in AP diameter at its most narrow point. Moderate left with mild right L4 foraminal stenosis. L5-S1: Mild disc bulge with disc desiccation. Mild reactive endplate changes, greater on the left. Mild bilateral facet hypertrophy. No significant canal or lateral recess stenosis. Mild left L5 foraminal narrowing. IMPRESSION: 1. No acute abnormality within the lumbar spine. 2. 5 mm facet mediated anterolisthesis of L4 on L5 with resultant severe spinal stenosis. 3. Right foraminal disc protrusion at L3-4, potentially affecting the exiting right L3 nerve root. 4. Right eccentric disc bulge with reactive endplate changes and facet hypertrophy at L2-3 with resultant moderate right foraminal and lateral recess stenosis. Either the right L2 or descending L3 nerve roots could be affected. 5. Small left foraminal to extraforaminal disc protrusion at L1-2, potentially affecting the exiting left L1 nerve root. Electronically Signed   By: Jeannine Boga M.D.   On: 12/15/2019 03:28   CT Hip Right Wo Contrast  Result Date: 12/14/2019 CLINICAL DATA:  Right hip pain, known degenerative change and recent injury, initial encounter EXAM: CT OF THE RIGHT HIP WITHOUT CONTRAST TECHNIQUE: Multidetector CT imaging of  the right hip was performed according to the standard protocol. Multiplanar CT image reconstructions were also generated. COMPARISON:  Plain film from earlier in the same day. FINDINGS: Bones/Joint/Cartilage Degenerative changes of the right hip joint are noted. Subchondral sclerosis and cyst formation is seen. Ligaments Suboptimally assessed by CT. Muscles and Tendons No muscular abnormality is noted. Soft tissues Surrounding soft tissue structures appear within normal limits with the exception of a small joint effusion in the right hip. Visualized pelvic structures are within normal limits. IMPRESSION: Degenerative changes the right hip joint without acute fracture. No findings to suggest avascular necrosis are noted as suggested on prior plain film. Small right hip joint effusion. Electronically Signed   By: Inez Catalina M.D.   On: 12/14/2019 23:25   US Carotid Bilateral (at [facility] and [facility] only)  Result Date: 12/18/2019 CLINICAL DATA:  Stroke-like symptoms EXAM: BILATERAL CAROTID DUPLEX ULTRASOUND TECHNIQUE: Pearline Cables scale imaging, color Doppler and duplex ultrasound were performed of bilateral carotid and vertebral arteries in the neck. COMPARISON:  None. FINDINGS: Criteria: Quantification  of carotid stenosis is based on velocity parameters that correlate the residual internal carotid diameter with NASCET-based stenosis levels, using the diameter of the distal internal carotid lumen as the denominator for stenosis measurement. The following velocity measurements were obtained: RIGHT ICA: 75/32 cm/sec CCA: 67/67 cm/sec SYSTOLIC ICA/CCA RATIO:  0.9 ECA:  83 cm/sec LEFT ICA: 110/25 cm/sec CCA: 20/94 cm/sec SYSTOLIC ICA/CCA RATIO:  1.5 ECA:  121 cm/sec RIGHT CAROTID ARTERY: Trace heterogeneous atherosclerotic plaque in the carotid bifurcation without extension into the internal carotid artery. No evidence of internal carotid artery stenosis. RIGHT VERTEBRAL ARTERY:  Patent with normal antegrade flow. LEFT CAROTID  ARTERY: 8 no significant atherosclerotic plaque or evidence of stenosis. LEFT VERTEBRAL ARTERY:  Patent with normal antegrade flow. Electronically Signed   By: Jacqulynn Cadet M.D.   On: 12/18/2019 16:04   US Venous Img Lower Bilateral (DVT)  Result Date: 12/22/2019 CLINICAL DATA:  Bilateral lower extremity pain and edema, right greater than left. History of previous DVT affecting the left lower extremity. Evaluate for acute or chronic DVT. EXAM: BILATERAL LOWER EXTREMITY VENOUS DOPPLER ULTRASOUND TECHNIQUE: Gray-scale sonography with graded compression, as well as color Doppler and duplex ultrasound were performed to evaluate the lower extremity deep venous systems from the level of the common femoral vein and including the common femoral, femoral, profunda femoral, popliteal and calf veins including the posterior tibial, peroneal and gastrocnemius veins when visible. The superficial great saphenous vein was also interrogated. Spectral Doppler was utilized to evaluate flow at rest and with distal augmentation maneuvers in the common femoral, femoral and popliteal veins. COMPARISON:  Left lower extremity venous Doppler ultrasound - 12/14/2019 (negative). FINDINGS: RIGHT LOWER EXTREMITY Common Femoral Vein: No evidence of thrombus. Normal compressibility, respiratory phasicity and response to augmentation. Saphenofemoral Junction: No evidence of thrombus. Normal compressibility and flow on color Doppler imaging. Profunda Femoral Vein: No evidence of thrombus. Normal compressibility and flow on color Doppler imaging. Femoral Vein: No evidence of thrombus. Normal compressibility, respiratory phasicity and response to augmentation. Popliteal Vein: No evidence of thrombus. Normal compressibility, respiratory phasicity and response to augmentation. Calf Veins: There is hypoechoic occlusive thrombus involving both paired right posterior tibial (images 27 and 28) and both paired peroneal veins (images 30 and 32).)  Superficial Great Saphenous Vein: No evidence of thrombus. Normal compressibility. Venous Reflux:  None. Other Findings:  None. LEFT LOWER EXTREMITY Common Femoral Vein: No evidence of thrombus. Normal compressibility, respiratory phasicity and response to augmentation. Saphenofemoral Junction: No evidence of thrombus. Normal compressibility and flow on color Doppler imaging. Profunda Femoral Vein: No evidence of thrombus. Normal compressibility and flow on color Doppler imaging. Femoral Vein: No evidence of thrombus. Normal compressibility, respiratory phasicity and response to augmentation. Popliteal Vein: No evidence of thrombus. Normal compressibility, respiratory phasicity and response to augmentation. Calf Veins: No evidence of thrombus. Normal compressibility and flow on color Doppler imaging. Superficial Great Saphenous Vein: No evidence of thrombus. Normal compressibility. Venous Reflux:  None. Other Findings:  None. IMPRESSION: 1. Examination is positive for occlusive DVT affecting both paired right posterior tibial and peroneal veins. 2. No evidence of acute or chronic DVT within the left lower extremity. Electronically Signed   By: Sandi Mariscal M.D.   On: 12/22/2019 14:03   US Venous Img Lower Bilateral  Result Date: 12/14/2019 CLINICAL DATA:  Swelling and pain for 2 weeks. EXAM: BILATERAL LOWER EXTREMITY VENOUS DOPPLER ULTRASOUND TECHNIQUE: Gray-scale sonography with compression, as well as color and duplex ultrasound, were performed to evaluate the  deep venous system(s) from the level of the common femoral vein through the popliteal and proximal calf veins. COMPARISON:  None. FINDINGS: VENOUS Normal compressibility of the common femoral, superficial femoral, and popliteal veins, as well as the visualized calf veins. Visualized portions of profunda femoral vein and great saphenous vein unremarkable. No filling defects to suggest DVT on grayscale or color Doppler imaging. Doppler waveforms show normal  direction of venous flow, normal respiratory plasticity and response to augmentation. OTHER None. Limitations: none IMPRESSION: Negative. Electronically Signed   By: Nolon Nations M.D.   On: 12/14/2019 18:35   DG Chest Port 1 View  Result Date: 12/19/2019 CLINICAL DATA:  Fever.  Recent total hip arthroplasty EXAM: PORTABLE CHEST 1 VIEW COMPARISON:  December 14, 2019 FINDINGS: Lungs are clear. Heart size and pulmonary vascularity are normal. No adenopathy. There is extensive arthropathy in each shoulder. IMPRESSION: Lungs clear. Cardiac silhouette within normal limits. No evident adenopathy. Electronically Signed   By: Lowella Grip III M.D.   On: 12/19/2019 13:38   ECHOCARDIOGRAM COMPLETE BUBBLE STUDY  Result Date: 12/18/2019    ECHOCARDIOGRAM REPORT   Patient Name:   ANALISSA BAYLESS Date of Exam: 12/18/2019 Medical Rec #:  [id number]     Height:       65.0 in Accession #:    [id number]    Weight:       180.0 lb Date of Birth:  [birth year]     BSA:          1.892 m Patient Age:    66 years      BP:           110/66 mmHg Patient Gender: F             HR:           79 bpm. Exam Location:  [facility] Procedure: 2D Echo, Color Doppler, Cardiac Doppler and Saline Contrast Bubble            Study Indications:     Stroke 434.91  History:         Patient has no prior history of Echocardiogram examinations.                  Risk Factors:Hypertension.  Sonographer:     Sherrie Sport RDCS (AE) Referring Phys:  [place] Diagnosing Phys: Ida Rogue MD IMPRESSIONS  1. Left ventricular ejection fraction, by estimation, is 55 to 60%. The left ventricle has normal function. The left ventricle has no regional wall motion abnormalities. Left ventricular diastolic parameters are consistent with Grade I diastolic dysfunction (impaired relaxation).  2. Right ventricular systolic function is normal. The right ventricular size is normal. There is normal pulmonary artery systolic pressure.  3. The mitral valve is normal in  structure. Mild mitral valve regurgitation. No evidence of mitral stenosis.  4. Negative saline contrast bubble study. FINDINGS  Left Ventricle: Left ventricular ejection fraction, by estimation, is 55 to 60%. The left ventricle has normal function. The left ventricle has no regional wall motion abnormalities. The left ventricular internal cavity size was normal in size. There is  no left ventricular hypertrophy. Left ventricular diastolic parameters are consistent with Grade I diastolic dysfunction (impaired relaxation). Right Ventricle: The right ventricular size is normal. No increase in right ventricular wall thickness. Right ventricular systolic function is normal. There is normal pulmonary artery systolic pressure. The tricuspid regurgitant velocity is 1.85 m/s, and  with an assumed right atrial pressure of 10 mmHg, the  estimated right ventricular systolic pressure is 88.4 mmHg. Left Atrium: Left atrial size was normal in size. Right Atrium: Right atrial size was normal in size. Pericardium: There is no evidence of pericardial effusion. Mitral Valve: The mitral valve is normal in structure. Normal mobility of the mitral valve leaflets. Mild mitral valve regurgitation. No evidence of mitral valve stenosis. Tricuspid Valve: The tricuspid valve is normal in structure. Tricuspid valve regurgitation is mild . No evidence of tricuspid stenosis. Aortic Valve: The aortic valve is normal in structure. Aortic valve regurgitation is not visualized. No aortic stenosis is present. Aortic valve mean gradient measures 4.0 mmHg. Aortic valve peak gradient measures 7.4 mmHg. Aortic valve area, by VTI measures 2.08 cm. Pulmonic Valve: The pulmonic valve was normal in structure. Pulmonic valve regurgitation is not visualized. No evidence of pulmonic stenosis. Aorta: The aortic root is normal in size and structure. Venous: The inferior vena cava is normal in size with greater than 50% respiratory variability, suggesting right  atrial pressure of 3 mmHg. IAS/Shunts: No atrial level shunt detected by color flow Doppler. Agitated saline contrast was given intravenously to evaluate for intracardiac shunting.  LEFT VENTRICLE PLAX 2D LVIDd:         4.80 cm  Diastology LVIDs:         2.69 cm  LV e' lateral:   10.40 cm/s LV PW:         1.06 cm  LV E/e' lateral: 5.9 LV IVS:        1.11 cm  LV e' medial:    6.74 cm/s LVOT diam:     2.00 cm  LV E/e' medial:  9.1 LV SV:         51 LV SV Index:   27 LVOT Area:     3.14 cm  RIGHT VENTRICLE RV Basal diam:  2.91 cm RV S prime:     13.30 cm/s TAPSE (M-mode): 3.4 cm LEFT ATRIUM           Index       RIGHT ATRIUM           Index LA diam:      3.50 cm 1.85 cm/m  RA Area:     22.10 cm LA Vol (A2C): 36.7 ml 19.40 ml/m RA Volume:   69.00 ml  36.48 ml/m LA Vol (A4C): 54.8 ml 28.97 ml/m  AORTIC VALVE                   PULMONIC VALVE AV Area (Vmax):    1.57 cm    PV Vmax:        0.81 m/s AV Area (Vmean):   1.78 cm    PV Peak grad:   2.6 mmHg AV Area (VTI):     2.08 cm    RVOT Peak grad: 3 mmHg AV Vmax:           136.00 cm/s AV Vmean:          89.000 cm/s AV VTI:            0.246 m AV Peak Grad:      7.4 mmHg AV Mean Grad:      4.0 mmHg LVOT Vmax:         68.00 cm/s LVOT Vmean:        50.300 cm/s LVOT VTI:          0.163 m LVOT/AV VTI ratio: 0.66  AORTA Ao Root diam: 2.60 cm MITRAL VALVE  TRICUSPID VALVE MV Area (PHT): 2.93 cm    TR Peak grad:   13.7 mmHg MV Decel Time: 259 msec    TR Vmax:        185.00 cm/s MV E velocity: 61.60 cm/s MV A velocity: 69.40 cm/s  SHUNTS MV E/A ratio:  0.89        Systemic VTI:  0.16 m                            Systemic Diam: 2.00 cm Ida Rogue MD Electronically signed by Ida Rogue MD Signature Date/Time: 12/18/2019/6:51:30 PM    Final    DG HIP OPERATIVE UNILAT W OR W/O PELVIS RIGHT  Result Date: 12/17/2019 CLINICAL DATA:  Right hip arthroplasty. EXAM: OPERATIVE RIGHT HIP (WITH PELVIS IF PERFORMED) TECHNIQUE: Fluoroscopic spot image(s) were submitted  for interpretation post-operatively. COMPARISON:  Preoperative CT. FINDINGS: Two fluoroscopic spot views obtained in the operating room. Right hip arthroplasty is in place. Fluoroscopy time 18 seconds. IMPRESSION: Intraoperative fluoroscopy during right hip arthroplasty. Electronically Signed   By: Keith Rake M.D.   On: 12/17/2019 15:58   DG HIP UNILAT W OR W/O PELVIS 2-3 VIEWS RIGHT  Result Date: 12/17/2019 CLINICAL DATA:  Postop total hip arthroplasty. EXAM: DG HIP (WITH OR WITHOUT PELVIS) 2-3V RIGHT COMPARISON:  None. FINDINGS: Right hip arthroplasty in expected alignment. No periprosthetic lucency or fractures. Recent postsurgical change includes air and edema in the soft tissues, skin staples and probable wound VAC in place. IMPRESSION: Right hip arthroplasty in expected alignment without immediate postoperative complication. Electronically Signed   By: Keith Rake M.D.   On: 12/17/2019 15:56   DG Hip Unilat  With Pelvis 2-3 Views Right  Result Date: 12/14/2019 CLINICAL DATA:  Recent fall with right hip pain, initial encounter EXAM: DG HIP (WITH OR WITHOUT PELVIS) 3V RIGHT COMPARISON:  None. FINDINGS: Pelvic ring is intact. Degenerative changes of the right hip joint are noted. Some remodeling of the femoral head is seen. Curvilinear lucency is noted in the femoral head superiorly could avascular necrosis. No acute fracture is seen. 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 No acute abnormality noted. Electronically Signed   By: Inez Catalina M.D.   On: 12/14/2019 19:46      Subjective: Pt c/o intermittent migraines    Discharge Exam: Vitals:   12/23/19 0733 12/23/19 1145  BP: 123/64 109/68  Pulse: 69 81  Resp: 16 16  Temp: 98.7 F (37.1 C) 98.5 F (36.9 C)  SpO2: 96% 95%   Vitals:   12/23/19 0056 12/23/19 0346 12/23/19 0733 12/23/19 1145  BP: 129/63 (!) 117/55 123/64 109/68  Pulse: 83 83 69 81  Resp: 16 16 16 16   Temp:  100.3 F (37.9 C) 99.4 F (37.4 C) 98.7 F (37.1 C) 98.5 F (36.9 C)  TempSrc: Oral Oral Oral Oral  SpO2: 93% 96% 96% 95%  Weight:      Height:        General: Pt is alert, awake, not in acute distress Cardiovascular: S1/S2 +, no rubs, no gallops Respiratory: decreased breath sounds b/l, no wheezing, no rhonchi Abdominal: Soft, NT, ND, bowel sounds + Extremities: no cyanosis    The results of significant diagnostics from this hospitalization (including imaging, microbiology, ancillary and laboratory) are listed below for reference.     Microbiology: Recent Results (from the past 240 hour(s))  SARS Coronavirus 2 by RT PCR (hospital order,  performed in [facility] hospital lab) Nasopharyngeal Nasopharyngeal Swab     Status: None   Collection Time: 12/15/19 12:35 AM   Specimen: Nasopharyngeal Swab  Result Value Ref Range Status   SARS Coronavirus 2 NEGATIVE NEGATIVE Final    Comment: (NOTE) SARS-CoV-2 target nucleic acids are NOT DETECTED.  The SARS-CoV-2 RNA is generally detectable in upper and lower respiratory specimens during the acute phase of infection. The lowest concentration of SARS-CoV-2 viral copies this assay can detect is 250 copies / mL. A negative result does not preclude SARS-CoV-2 infection and should not be used as the sole basis for treatment or other patient management decisions.  A negative result may occur with improper specimen collection / handling, submission of specimen other than nasopharyngeal swab, presence of viral mutation(s) within the areas targeted by this assay, and inadequate number of viral copies (<250 copies / mL). A negative result must be combined with clinical observations, patient history, and epidemiological information.  Fact Sheet for Patients:   StrictlyIdeas.no  Fact Sheet for Healthcare Providers: BankingDealers.co.za  This test is not yet approved or  cleared by the [place] FDA and has been authorized for detection and/or diagnosis of SARS-CoV-2 by FDA under an Emergency Use Authorization (EUA).  This EUA will remain in effect (meaning this test can be used) for the duration of the COVID-19 declaration under Section 564(b)(1) of the Act, 21 U.S.C. section 360bbb-3(b)(1), unless the authorization is terminated or revoked sooner.  Performed at [facility], [address] [place], [place] [address]   Surgical pcr screen     Status: None   Collection Time: 12/16/19  3:57 PM   Specimen: Nasal Mucosa; Nasal Swab  Result Value Ref Range Status   MRSA, PCR NEGATIVE NEGATIVE Final   Staphylococcus aureus NEGATIVE NEGATIVE Final    Comment: (NOTE) The Xpert SA Assay (FDA approved for NASAL specimens in patients 38 years of age and older), is one component of a comprehensive surveillance program. It is not intended to diagnose infection nor to guide or monitor treatment. Performed at [facility], [address] [place], [place] [address]   SARS Coronavirus 2 by RT PCR (hospital order, performed in [facility] hospital lab) Nasopharyngeal Nasopharyngeal Swab     Status: None   Collection Time: 12/21/19  3:13 PM   Specimen: Nasopharyngeal Swab  Result Value Ref Range Status   SARS Coronavirus 2 NEGATIVE NEGATIVE Final    Comment: (NOTE) SARS-CoV-2 target nucleic acids are NOT DETECTED.  The SARS-CoV-2 RNA is generally detectable in upper and lower respiratory specimens during the acute phase of infection. The lowest concentration of SARS-CoV-2 viral copies this assay can detect is 250 copies / mL. A negative result does not preclude SARS-CoV-2 infection and should not be used as the sole basis for treatment or other patient management decisions.  A negative result may occur with improper specimen collection / handling, submission of specimen other than nasopharyngeal swab, presence of viral mutation(s) within the areas targeted  by this assay, and inadequate number of viral copies (<250 copies / mL). A negative result must be combined with clinical observations, patient history, and epidemiological information.  Fact Sheet for Patients:   StrictlyIdeas.no  Fact Sheet for Healthcare Providers: BankingDealers.co.za  This test is not yet approved or  cleared by the [place] FDA and has been authorized for detection and/or diagnosis of SARS-CoV-2 by FDA under an Emergency Use Authorization (EUA).  This EUA will remain in effect (  meaning this test can be used) for the duration of the COVID-19 declaration under Section 564(b)(1) of the Act, 21 U.S.C. section 360bbb-3(b)(1), unless the authorization is terminated or revoked sooner.  Performed at [facility], [address] [place], [place] [address]      Labs: BNP (last 3 results) No results for input(s): BNP in the last 8760 hours. Basic Metabolic Panel: Recent Labs  Lab 12/19/19 0552 12/20/19 0416 12/21/19 0610 12/22/19 1439 12/23/19 0508  NA 141 138 140 137 141  K 4.1 4.0 5.1 4.4 4.5  CL 107 105 107 103 106  CO2 29 27 29 25 29   GLUCOSE 106* 106* 111* 100* 106*  BUN 22 23 18 16 14   CREATININE 0.85 0.91 0.88 1.01* 0.83  CALCIUM 7.7* 7.9* 8.6* 8.1* 8.2*  MG  --   --  1.9  --   --    Liver Function Tests: Recent Labs  Lab 12/22/19 1439 12/23/19 0508  AST 19 16  ALT 15 14  ALKPHOS 68 61  BILITOT 0.6 0.6  PROT 5.9* 5.5*  ALBUMIN 2.8* 2.5*   No results for input(s): LIPASE, AMYLASE in the last 168 hours. No results for input(s): AMMONIA in the last 168 hours. CBC: Recent Labs  Lab 12/19/19 0552 12/20/19 0416 12/21/19 0610 12/22/19 1439 12/23/19 0508  WBC 11.6* 11.5* 11.4* 10.8* 8.9  HGB 10.3* 9.9* 10.3* 10.6* 9.8*  HCT 31.6* 30.2* 30.5* 32.4* 29.9*  MCV 92.1 91.8 91.0 92.6 92.6  PLT 175 165 202 233 245   Cardiac Enzymes: No results for input(s): CKTOTAL, CKMB,  CKMBINDEX, TROPONINI in the last 168 hours. BNP: Invalid input(s): POCBNP CBG: Recent Labs  Lab 12/22/19 1124  GLUCAP 139*   D-Dimer No results for input(s): DDIMER in the last 72 hours. Hgb A1c No results for input(s): HGBA1C in the last 72 hours. Lipid Profile No results for input(s): CHOL, HDL, LDLCALC, TRIG, CHOLHDL, LDLDIRECT in the last 72 hours. Thyroid function studies No results for input(s): TSH, T4TOTAL, T3FREE, THYROIDAB in the last 72 hours.  Invalid input(s): FREET3 Anemia work up No results for input(s): VITAMINB12, FOLATE, FERRITIN, TIBC, IRON, RETICCTPCT in the last 72 hours. Urinalysis    Component Value Date/Time   COLORURINE YELLOW (A) 12/19/2019 1127   APPEARANCEUR HAZY (A) 12/19/2019 1127   LABSPEC 1.023 12/19/2019 1127   PHURINE 5.0 12/19/2019 1127   GLUCOSEU NEGATIVE 12/19/2019 1127   HGBUR NEGATIVE 12/19/2019 1127   BILIRUBINUR NEGATIVE 12/19/2019 1127   KETONESUR NEGATIVE 12/19/2019 1127   PROTEINUR NEGATIVE 12/19/2019 1127   NITRITE NEGATIVE 12/19/2019 1127   LEUKOCYTESUR NEGATIVE 12/19/2019 1127   Sepsis Labs Invalid input(s): PROCALCITONIN,  WBC,  LACTICIDVEN Microbiology Recent Results (from the past 240 hour(s))  SARS Coronavirus 2 by RT PCR (hospital order, performed in [place] hospital lab) Nasopharyngeal Nasopharyngeal Swab     Status: None   Collection Time: 12/15/19 12:35 AM   Specimen: Nasopharyngeal Swab  Result Value Ref Range Status   SARS Coronavirus 2 NEGATIVE NEGATIVE Final    Comment: (NOTE) SARS-CoV-2 target nucleic acids are NOT DETECTED.  The SARS-CoV-2 RNA is generally detectable in upper and lower respiratory specimens during the acute phase of infection. The lowest concentration of SARS-CoV-2 viral copies this assay can detect is 250 copies / mL. A negative result does not preclude SARS-CoV-2 infection and should not be used as the sole basis for treatment or other patient management decisions.  A negative  result may occur with improper specimen collection /  handling, submission of specimen other than nasopharyngeal swab, presence of viral mutation(s) within the areas targeted by this assay, and inadequate number of viral copies (<250 copies / mL). A negative result must be combined with clinical observations, patient history, and epidemiological information.  Fact Sheet for Patients:   StrictlyIdeas.no  Fact Sheet for Healthcare Providers: BankingDealers.co.za  This test is not yet approved or  cleared by the [place] FDA and has been authorized for detection and/or diagnosis of SARS-CoV-2 by FDA under an Emergency Use Authorization (EUA).  This EUA will remain in effect (meaning this test can be used) for the duration of the COVID-19 declaration under Section 564(b)(1) of the Act, 21 U.S.C. section 360bbb-3(b)(1), unless the authorization is terminated or revoked sooner.  Performed at [facility], [address] [place], [place] [address]   Surgical pcr screen     Status: None   Collection Time: 12/16/19  3:57 PM   Specimen: Nasal Mucosa; Nasal Swab  Result Value Ref Range Status   MRSA, PCR NEGATIVE NEGATIVE Final   Staphylococcus aureus NEGATIVE NEGATIVE Final    Comment: (NOTE) The Xpert SA Assay (FDA approved for NASAL specimens in patients 75 years of age and older), is one component of a comprehensive surveillance program. It is not intended to diagnose infection nor to guide or monitor treatment. Performed at [facility], [address] [place], [place] [address]   SARS Coronavirus 2 by RT PCR (hospital order, performed in [facility] hospital lab) Nasopharyngeal Nasopharyngeal Swab     Status: None   Collection Time: 12/21/19  3:13 PM   Specimen: Nasopharyngeal Swab  Result Value Ref Range Status   SARS Coronavirus 2 NEGATIVE NEGATIVE Final    Comment: (NOTE) SARS-CoV-2 target nucleic acids  are NOT DETECTED.  The SARS-CoV-2 RNA is generally detectable in upper and lower respiratory specimens during the acute phase of infection. The lowest concentration of SARS-CoV-2 viral copies this assay can detect is 250 copies / mL. A negative result does not preclude SARS-CoV-2 infection and should not be used as the sole basis for treatment or other patient management decisions.  A negative result may occur with improper specimen collection / handling, submission of specimen other than nasopharyngeal swab, presence of viral mutation(s) within the areas targeted by this assay, and inadequate number of viral copies (<250 copies / mL). A negative result must be combined with clinical observations, patient history, and epidemiological information.  Fact Sheet for Patients:   StrictlyIdeas.no  Fact Sheet for Healthcare Providers: BankingDealers.co.za  This test is not yet approved or  cleared by the [place] FDA and has been authorized for detection and/or diagnosis of SARS-CoV-2 by FDA under an Emergency Use Authorization (EUA).  This EUA will remain in effect (meaning this test can be used) for the duration of the COVID-19 declaration under Section 564(b)(1) of the Act, 21 U.S.C. section 360bbb-3(b)(1), unless the authorization is terminated or revoked sooner.  Performed at [facility], [address] [place], [place] [address]      Time coordinating discharge: Over 30 minutes  SIGNED:   Wyvonnia Dusky, MD  [facility] 12/23/2019, 1:03 PM Pager   If 7PM-7AM, please contact night-coverage [email or web address]

## 2019-12-23 NOTE — Plan of Care (Signed)
  Problem: Education: Goal: Knowledge of General Education information will improve Description Including pain rating scale, medication(s)/side effects and non-pharmacologic comfort measures Outcome: Progressing   

## 2019-12-23 NOTE — Progress Notes (Addendum)
Discharge Note: Contacted/reported to Manuel Garcia,   regarding discharge plans. Reported to facility nurse, Roosvelt Harps. Obtained vitals. Ted hose on, prevena application for d/c noted. Pt transported to [facility] facility via EMS.

## 2019-12-23 NOTE — Progress Notes (Signed)
Physical Therapy Treatment Patient Details Name: Linda Farley MRN: [id number] DOB: 09/08/1950 Today's Date: 12/23/2019    History of Present Illness Patient is a 69 year old female presenting to the ED with acute onset worsening low back pain as well as right hip pain with radiation to right leg with associated right leg weakness/burning without tingling or numbness.  Neurosurgery consulted and recommending conservative management for lumbar degenerative disease, spinal stenosis.  Pt diagnosed with right femoral head fracture and is s/p R THA.  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  MD assessment also includes palpitations, and chronic low back pain with radiculopathy. PMH includes: peripheral neuropathy, depression, and HTN.    PT Comments    Pt was pleasant and motivated to participate in today's session. Pt had good carryover with supine/sitting exercises and was able to perform with minimal difficulty. Pt has improved with bed mobility and attempted to use her LLE to hook her RLE to bring it to the EOB. Pt was able to bring herself close to EOB, but still required min assist to bring the RLE to EOB - likely secondary to pain. Pt was able to come from sit to stand with min cueing on foot and hand placement and min assist to come to full standing. Once in standing, pt was able to lift RLE ~1in but was unable to lift LLE likely secondary to pain. Pt was able to advance BLE ~37ft with a shuffling gait. After standing for ~78min, pt reported feeling dizzy and was promptly returned to sitting at EOB. BP was taken in sitting and recorded as 104/79, with sx resolving fairly quickly. Pt will benefit from PT services in a SNF setting upon discharge to safely address deficits listed in patient problem list for decreased caregiver assistance and eventual return to PLOF.   Follow Up Recommendations  SNF     Equipment Recommendations  Rolling  walker with 5" wheels    Recommendations for Other Services       Precautions / Restrictions Precautions Precautions: Fall;Anterior Hip Precaution Booklet Issued: Yes (comment) Restrictions Weight Bearing Restrictions: Yes RLE Weight Bearing: Weight bearing as tolerated Other Position/Activity Restrictions: pt reports dizziness after ~29min standing. Pt was promptly returned to sitting at EOB and BP was recorded in seated as 104/79.    Mobility  Bed Mobility Overal bed mobility: Needs Assistance Bed Mobility: Supine to Sit;Sit to Supine     Supine to sit: HOB elevated;Min assist Sit to supine: Mod assist   General bed mobility comments: pt required min assist to move RLE to across bed with supine-sit and sit-supine transitions likely secondary to pain  Transfers Overall transfer level: Needs assistance Equipment used: Rolling walker (2 wheeled) Transfers: Sit to/from Stand Sit to Stand: Min assist         General transfer comment: pt required min assist to come to a full stand and continued to require cueing for hand and foot positioning  Ambulation/Gait Ambulation/Gait assistance: Min assist Gait Distance (Feet): 1 Feet Assistive device: Rolling walker (2 wheeled) Gait Pattern/deviations: Decreased step length - right;Decreased step length - left;Trunk flexed;Shuffle Gait velocity: decreased   General Gait Details: able to shuffle feet ~55ft forward. Unable to lift LLE secondary to inability to bear weight through RLE due to pain   Stairs             Wheelchair Mobility    Modified Rankin (Stroke Patients Only)  Balance Overall balance assessment: Needs assistance;History of Falls Sitting-balance support: Feet unsupported;No upper extremity supported Sitting balance-Leahy Scale: Good     Standing balance support: Bilateral upper extremity supported Standing balance-Leahy Scale: Fair Standing balance comment: Mod lean on the RW for support                             Cognition Arousal/Alertness: Awake/alert Behavior During Therapy: WFL for tasks assessed/performed Overall Cognitive Status: Within Functional Limits for tasks assessed                                 General Comments: Pt very pleasant and in good humor      Exercises Total Joint Exercises Ankle Circles/Pumps: AROM;20 reps;Both Quad Sets: Strengthening;Both;10 reps Gluteal Sets: Strengthening;Both;10 reps Hip ABduction/ADduction: AAROM;Both;5 reps Long Arc Quad: AROM;Strengthening;Both;10 reps Marching in Standing: AROM;Right;5 reps (Pt unable to lift LLE secondary to inability to bear weight through RLE due to pain)    General Comments        Pertinent Vitals/Pain Pain Assessment: 0-10 Pain Score: 5  Pain Location: R hip Pain Descriptors / Indicators: Guarding;Grimacing;Sore Pain Intervention(s): Monitored during session;Repositioned;Premedicated before session;Limited activity within patient's tolerance    Home Living                      Prior Function            PT Goals (current goals can now be found in the care plan section) Progress towards PT goals: Progressing toward goals    Frequency    BID      PT Plan Current plan remains appropriate    Co-evaluation              AM-PAC PT "6 Clicks" Mobility   Outcome Measure  Help needed turning from your back to your side while in a flat bed without using bedrails?: A Little Help needed moving from lying on your back to sitting on the side of a flat bed without using bedrails?: A Little Help needed moving to and from a bed to a chair (including a wheelchair)?: A Little Help needed standing up from a chair using your arms (e.g., wheelchair or bedside chair)?: A Little Help needed to walk in hospital room?: A Lot Help needed climbing 3-5 steps with a railing? : Total 6 Click Score: 15    End of Session Equipment Utilized During Treatment: Gait  belt Activity Tolerance: Other (comment);Patient limited by pain (Pt was unable to maintain standing secondary to dizziness and had to sit down (See impression)) Patient left: in bed;with call bell/phone within reach;with SCD's reapplied (bed alarm not working, nursing notified) Nurse Communication: Mobility status;Weight bearing status PT Visit Diagnosis: Muscle weakness (generalized) (M62.81);Other abnormalities of gait and mobility (R26.89);Pain;History of falling (Z91.81);Other symptoms and signs involving the nervous system (R29.898) Pain - Right/Left: Right Pain - part of body: Hip     Time: 9147-8295 PT Time Calculation (min) (ACUTE ONLY): 27 min  Charges:                        Syona Wroblewski SPT 12/23/19, 3:51 PM

## 2019-12-23 NOTE — TOC Progression Note (Signed)
Transition of Care [facility]) - Progression Note    Patient Details  Name: Linda Farley MRN: [id number] Date of Birth: [birth year]  [facility] CM/SW Contact  Cameren Earnest, Gardiner Rhyme, LCSW Phone Number: 12/23/2019, 10:31 AM  Clinical Narrative:  According to MD pt is medically ready to transfer today to [facility]. Have informed pt and sister made aware of plan. Will work on [employer] for today.    Expected Discharge Plan: Home/Self Care Barriers to Discharge: Barriers Resolved  Expected Discharge Plan and Services Expected Discharge Plan: Home/Self Care In-house Referral: Clinical Social Work   Post Acute Care Choice: Durable Medical Equipment Living arrangements for the past 2 months: Single Family Home                 DME Arranged: Walker rolling DME Agency: [employer] Date DME Agency Contacted: 12/15/19 Time DME Agency Contacted: [phone] Representative spoke with at DME Agency: Zack             Social Determinants of Health ([place]) Interventions    Readmission Risk Interventions No flowsheet data found.

## 2019-12-26 DIAGNOSIS — Z96641 Presence of right artificial hip joint: Secondary | ICD-10-CM | POA: Diagnosis not present

## 2019-12-26 DIAGNOSIS — M549 Dorsalgia, unspecified: Secondary | ICD-10-CM | POA: Diagnosis not present

## 2019-12-26 DIAGNOSIS — G8929 Other chronic pain: Secondary | ICD-10-CM | POA: Diagnosis not present

## 2019-12-26 DIAGNOSIS — I82401 Acute embolism and thrombosis of unspecified deep veins of right lower extremity: Secondary | ICD-10-CM | POA: Diagnosis not present

## 2019-12-26 DIAGNOSIS — D62 Acute posthemorrhagic anemia: Secondary | ICD-10-CM | POA: Diagnosis not present

## 2019-12-26 DIAGNOSIS — I6990 Unspecified sequelae of unspecified cerebrovascular disease: Secondary | ICD-10-CM | POA: Diagnosis not present

## 2019-12-29 NOTE — Anesthesia Postprocedure Evaluation (Signed)
Anesthesia Post Note  Patient: [name]  Procedure(s) Performed: TOTAL HIP ARTHROPLASTY ANTERIOR APPROACH (Right Hip)  Patient location during evaluation: PACU Anesthesia Type: General Level of consciousness: awake and alert Pain management: pain level controlled Vital Signs Assessment: post-procedure vital signs reviewed and stable Respiratory status: spontaneous breathing, nonlabored ventilation, respiratory function stable and patient connected to nasal cannula oxygen Cardiovascular status: blood pressure returned to baseline and stable Postop Assessment: no apparent nausea or vomiting Anesthetic complications: no   No complications documented.   Last Vitals:  Vitals:   12/23/19 1145 12/23/19 1545  BP: 109/68 108/63  Pulse: 81 76  Resp: 16 16  Temp: 36.9 C 36.9 C  SpO2: 95% 99%    Last Pain:  Vitals:   12/23/19 1545  TempSrc: Oral  PainSc:                  Molli Barrows

## 2020-01-11 DIAGNOSIS — I6990 Unspecified sequelae of unspecified cerebrovascular disease: Secondary | ICD-10-CM | POA: Diagnosis not present

## 2020-01-11 DIAGNOSIS — Z96641 Presence of right artificial hip joint: Secondary | ICD-10-CM | POA: Diagnosis not present

## 2020-01-11 DIAGNOSIS — I82401 Acute embolism and thrombosis of unspecified deep veins of right lower extremity: Secondary | ICD-10-CM | POA: Diagnosis not present

## 2020-01-11 DIAGNOSIS — D62 Acute posthemorrhagic anemia: Secondary | ICD-10-CM | POA: Diagnosis not present

## 2020-01-14 ENCOUNTER — Encounter: Payer: Self-pay | Admitting: Family Medicine

## 2020-01-14 ENCOUNTER — Ambulatory Visit (INDEPENDENT_AMBULATORY_CARE_PROVIDER_SITE_OTHER): Payer: PPO | Admitting: Family Medicine

## 2020-01-14 ENCOUNTER — Other Ambulatory Visit: Payer: Self-pay

## 2020-01-14 VITALS — BP 117/64 | HR 95 | Temp 99.1°F | Ht 65.0 in | Wt 194.8 lb

## 2020-01-14 DIAGNOSIS — R42 Dizziness and giddiness: Secondary | ICD-10-CM | POA: Diagnosis not present

## 2020-01-14 DIAGNOSIS — I82561 Chronic embolism and thrombosis of right calf muscular vein: Secondary | ICD-10-CM | POA: Diagnosis not present

## 2020-01-14 DIAGNOSIS — Z09 Encounter for follow-up examination after completed treatment for conditions other than malignant neoplasm: Secondary | ICD-10-CM | POA: Insufficient documentation

## 2020-01-14 DIAGNOSIS — M48062 Spinal stenosis, lumbar region with neurogenic claudication: Secondary | ICD-10-CM | POA: Diagnosis not present

## 2020-01-14 DIAGNOSIS — J45998 Other asthma: Secondary | ICD-10-CM | POA: Diagnosis not present

## 2020-01-14 DIAGNOSIS — I10 Essential (primary) hypertension: Secondary | ICD-10-CM | POA: Diagnosis not present

## 2020-01-14 DIAGNOSIS — F329 Major depressive disorder, single episode, unspecified: Secondary | ICD-10-CM | POA: Diagnosis not present

## 2020-01-14 DIAGNOSIS — E785 Hyperlipidemia, unspecified: Secondary | ICD-10-CM

## 2020-01-14 DIAGNOSIS — G8918 Other acute postprocedural pain: Secondary | ICD-10-CM | POA: Diagnosis not present

## 2020-01-14 MED ORDER — HYDROCODONE-ACETAMINOPHEN 5-325 MG PO TABS: 1.0000 | ORAL_TABLET | Freq: Four times a day (QID) | ORAL | 0 refills | Status: AC | PRN

## 2020-01-14 MED ORDER — ENOXAPARIN SODIUM 80 MG/0.8ML ~~LOC~~ SOLN: 80.0000 mg | Freq: Two times a day (BID) | SUBCUTANEOUS | 0 refills | Status: DC

## 2020-01-14 MED ORDER — CITALOPRAM HYDROBROMIDE 20 MG PO TABS: 20.0000 mg | ORAL_TABLET | Freq: Every day | ORAL | 0 refills | Status: DC

## 2020-01-14 MED ORDER — ATORVASTATIN CALCIUM 20 MG PO TABS: 20.0000 mg | ORAL_TABLET | Freq: Every day | ORAL | 1 refills | Status: DC

## 2020-01-14 MED ORDER — MECLIZINE HCL 12.5 MG PO TABS: 12.5000 mg | ORAL_TABLET | Freq: Three times a day (TID) | ORAL | 0 refills | Status: DC

## 2020-01-14 MED ORDER — GABAPENTIN 300 MG PO CAPS: 300.0000 mg | ORAL_CAPSULE | ORAL | 1 refills | Status: AC

## 2020-01-14 MED ORDER — APIXABAN 5 MG PO TABS: 5.0000 mg | ORAL_TABLET | Freq: Two times a day (BID) | ORAL | 1 refills | Status: DC

## 2020-01-14 MED ORDER — ENOXAPARIN SODIUM 40 MG/0.4ML ~~LOC~~ SOLN: 40.0000 mg | SUBCUTANEOUS | 0 refills | Status: DC

## 2020-01-14 NOTE — Progress Notes (Signed)
Subjective:    Patient ID: Linda Farley, female    DOB: 08/21/1950, 69 y.o.   MRN: [id number]  Linda Farley is a 69 y.o. female presenting on 01/14/2020 for Hospitalization Follow-up (pt had Rt hip replacement x 27mth ago. Post surgery she had a TIA. )   HPI  HOSPITAL FOLLOW-UP VISIT  Hospital/Location: [facility] Date of Admission: 12/14/2019 Date of Discharge: 01/12/2020 Transitions of care telephone call: N/A  Reason for Admission: Weakness & Shortness of Breath Primary (+Secondary) Diagnosis: Intractable low back pain as well as right hip pain with associated right lower extremity radiculopathy (hypertension, peripheral neuropathy, depression)  - Hospital H&P and Discharge Summary have been reviewed - Patient presents today 2 days after recent hospitalization. Brief summary of recent course, patient had symptoms of low back pain and need for right hip replacement, hospitalized, inpatient treated with surgery, medications and physical therapy. - Today reports overall has done well after discharge. Symptoms of are much improved  - New medications on discharge: Lovenox 80mg  subcutaneously Q12H x 3 months - Changes to current meds on discharge: Awaiting medication reconcilliation from [facility], requested x 3  Awaiting discharge medication list from [facility] in [place].  CMA has requested 3x.  Current Outpatient Medications:  .  albuterol (VENTOLIN HFA) 108 (90 Base) MCG/ACT inhaler, INHALE 2 PUFFS BY MOUTH EVERY 6 HOURS AS NEEDED FOR WHEEZING OR SHORTNESS OF BREATH, Disp: 9 g, Rfl: 0 .  atorvastatin (LIPITOR) 20 MG tablet, Take 1 tablet (20 mg total) by mouth daily., Disp: 90 tablet, Rfl: 1 .  Cholecalciferol (D3 SUPER STRENGTH) 50 MCG (2000 UT) CAPS, Take by mouth., Disp: , Rfl:  .  citalopram (CELEXA) 20 MG tablet, Take 1 tablet (20 mg total) by mouth daily., Disp: 90 tablet, Rfl: 0 .  enoxaparin (LOVENOX) 80 MG/0.8ML injection, Inject 0.8 mLs (80 mg  total) into the skin every 12 (twelve) hours., Disp: 48 mL, Rfl: 0 .  gabapentin (NEURONTIN) 300 MG capsule, Take 1 capsule (300 mg total) by mouth as directed. One capsule in the morning and two capsules every day at bedtime, Disp: 270 capsule, Rfl: 1 .  HYDROcodone-acetaminophen (NORCO) 5-325 MG tablet, Take 1-2 tablets by mouth every 6 (six) hours as needed for up to 7 days for moderate pain., Disp: 56 tablet, Rfl: 0 .  Magnesium 500 MG TABS, Take 1 tablet by mouth daily. , Disp: , Rfl:  .  meclizine (ANTIVERT) 12.5 MG tablet, Take 1 tablet (12.5 mg total) by mouth 3 (three) times daily., Disp: 90 tablet, Rfl: 0  ------------------------------------------------------------------------- Social History   Tobacco Use  . Smoking status: Never Smoker  . Smokeless tobacco: Never Used  Vaping Use  . Vaping Use: Never used  Substance Use Topics  . Alcohol use: Yes    Alcohol/week: 3.0 standard drinks    Types: 3 Glasses of wine per week    Comment: 3 wine coolers weekly  . Drug use: Never    Review of Systems  Constitutional: Negative.   HENT: Negative.   Eyes: Negative.   Respiratory: Negative.   Cardiovascular: Negative.   Gastrointestinal: Negative.   Endocrine: Negative.   Genitourinary: Negative.   Musculoskeletal: Negative.   Skin: Negative.   Allergic/Immunologic: Negative.   Neurological: Negative.   Hematological: Negative.   Psychiatric/Behavioral: Negative.    Per HPI unless specifically indicated above     Objective:    BP 117/64 (BP Location: Right Arm, Patient Position: Sitting, Cuff  Size: Large)   Pulse 95   Temp 99.1 F (37.3 C) (Oral)   Ht 5\' 5"  (1.651 m)   Wt 194 lb 12.8 oz (88.4 kg)   SpO2 99%   BMI 32.42 kg/m   Wt Readings from Last 3 Encounters:  01/14/20 194 lb 12.8 oz (88.4 kg)  12/14/19 180 lb (81.6 kg)  09/24/19 188 lb (85.3 kg)    Physical Exam Vitals reviewed.  Constitutional:      General: She is not in acute distress.    Appearance:  Normal appearance. She is well-developed and well-groomed. She is obese. She is not ill-appearing or toxic-appearing.  HENT:     Head: Normocephalic and atraumatic.     Nose:     Comments: Lizbeth Bark is in place, covering mouth and nose. Eyes:     General: Lids are normal. Vision grossly intact.        Right eye: No discharge.        Left eye: No discharge.     Extraocular Movements: Extraocular movements intact.     Conjunctiva/sclera: Conjunctivae normal.     Pupils: Pupils are equal, round, and reactive to light.  Cardiovascular:     Rate and Rhythm: Normal rate and regular rhythm.     Pulses: Normal pulses.          Dorsalis pedis pulses are 2+ on the right side and 2+ on the left side.     Heart sounds: Normal heart sounds. No murmur heard.  No friction rub. No gallop.   Pulmonary:     Effort: Pulmonary effort is normal. No respiratory distress.     Breath sounds: Normal breath sounds.  Musculoskeletal:        General: Tenderness present.     Right hip: Tenderness present. Decreased range of motion.     Left hip: Normal.     Right upper leg: Normal.     Left upper leg: Normal.     Right knee: Normal.     Left knee: Normal.     Right lower leg: No edema.     Left lower leg: No edema.     Comments: S/P Right hip replacement  Skin:    General: Skin is warm and dry.     Capillary Refill: Capillary refill takes less than 2 seconds.  Neurological:     General: No focal deficit present.     Mental Status: She is alert and oriented to person, place, and time.     Gait: Gait abnormal.     Comments: Ambulating with walker  Psychiatric:        Attention and Perception: Attention and perception normal.        Mood and Affect: Mood and affect normal.        Speech: Speech normal.        Behavior: Behavior normal. Behavior is cooperative.        Thought Content: Thought content normal.        Cognition and Memory: Cognition and memory normal.        Judgment: Judgment normal.     Results for orders placed or performed during the hospital encounter of 12/14/19  SARS Coronavirus 2 by RT PCR (hospital order, performed in [facility] hospital lab) Nasopharyngeal Nasopharyngeal Swab   Specimen: Nasopharyngeal Swab  Result Value Ref Range   SARS Coronavirus 2 NEGATIVE NEGATIVE  Surgical pcr screen   Specimen: Nasal Mucosa; Nasal Swab  Result Value Ref Range   MRSA,  PCR NEGATIVE NEGATIVE   Staphylococcus aureus NEGATIVE NEGATIVE  SARS Coronavirus 2 by RT PCR (hospital order, performed in [facility] hospital lab) Nasopharyngeal Nasopharyngeal Swab   Specimen: Nasopharyngeal Swab  Result Value Ref Range   SARS Coronavirus 2 NEGATIVE NEGATIVE  CBC  Result Value Ref Range   WBC 10.0 4.0 - 10.5 K/uL   RBC 4.53 3.87 - 5.11 MIL/uL   Hemoglobin 13.7 12.0 - 15.0 g/dL   HCT 41.7 36 - 46 %   MCV 92.1 80.0 - 100.0 fL   MCH 30.2 26.0 - 34.0 pg   MCHC 32.9 30.0 - 36.0 g/dL   RDW 14.3 11.5 - 15.5 %   Platelets 224 150 - 400 K/uL   nRBC 0.0 0.0 - 0.2 %  Urinalysis, Complete w Microscopic  Result Value Ref Range   Color, Urine YELLOW (A) YELLOW   APPearance HAZY (A) CLEAR   Specific Gravity, Urine 1.018 1.005 - 1.030   pH 6.0 5.0 - 8.0   Glucose, UA NEGATIVE NEGATIVE mg/dL   Hgb urine dipstick NEGATIVE NEGATIVE   Bilirubin Urine NEGATIVE NEGATIVE   Ketones, ur 20 (A) NEGATIVE mg/dL   Protein, ur NEGATIVE NEGATIVE mg/dL   Nitrite NEGATIVE NEGATIVE   Leukocytes,Ua NEGATIVE NEGATIVE   RBC / HPF 0-5 0 - 5 RBC/hpf   WBC, UA 0-5 0 - 5 WBC/hpf   Bacteria, UA RARE (A) NONE SEEN   Squamous Epithelial / LPF 0-5 0 - 5  Comprehensive metabolic panel  Result Value Ref Range   Sodium 140 135 - 145 mmol/L   Potassium 3.9 3.5 - 5.1 mmol/L   Chloride 102 98 - 111 mmol/L   CO2 27 22 - 32 mmol/L   Glucose, Bld 95 70 - 99 mg/dL   BUN 29 (H) 8 - 23 mg/dL   Creatinine, Ser 1.08 (H) 0.44 - 1.00 mg/dL   Calcium 9.2 8.9 - 10.3 mg/dL   Total Protein 6.8 6.5 - 8.1 g/dL   Albumin  3.9 3.5 - 5.0 g/dL   AST 17 15 - 41 U/L   ALT 12 0 - 44 U/L   Alkaline Phosphatase 87 38 - 126 U/L   Total Bilirubin 0.9 0.3 - 1.2 mg/dL   GFR calc non Af Amer 52 (L) >60 mL/min   GFR calc Af Amer >60 >60 mL/min   Anion gap 11 5 - 15  HIV Antibody (routine testing w rflx)  Result Value Ref Range   HIV Screen 4th Generation wRfx Non Reactive Non Reactive  Basic metabolic panel  Result Value Ref Range   Sodium 141 135 - 145 mmol/L   Potassium 4.3 3.5 - 5.1 mmol/L   Chloride 103 98 - 111 mmol/L   CO2 28 22 - 32 mmol/L   Glucose, Bld 116 (H) 70 - 99 mg/dL   BUN 28 (H) 8 - 23 mg/dL   Creatinine, Ser 0.93 0.44 - 1.00 mg/dL   Calcium 9.0 8.9 - 10.3 mg/dL   GFR calc non Af Amer >60 >60 mL/min   GFR calc Af Amer >60 >60 mL/min   Anion gap 10 5 - 15  CBC  Result Value Ref Range   WBC 9.2 4.0 - 10.5 K/uL   RBC 4.12 3.87 - 5.11 MIL/uL   Hemoglobin 12.5 12.0 - 15.0 g/dL   HCT 37.5 36 - 46 %   MCV 91.0 80.0 - 100.0 fL   MCH 30.3 26.0 - 34.0 pg   MCHC 33.3 30.0 - 36.0 g/dL  RDW 14.2 11.5 - 15.5 %   Platelets 218 150 - 400 K/uL   nRBC 0.0 0.0 - 0.2 %  CBC  Result Value Ref Range   WBC 10.5 4.0 - 10.5 K/uL   RBC 4.22 3.87 - 5.11 MIL/uL   Hemoglobin 12.7 12.0 - 15.0 g/dL   HCT 38.4 36 - 46 %   MCV 91.0 80.0 - 100.0 fL   MCH 30.1 26.0 - 34.0 pg   MCHC 33.1 30.0 - 36.0 g/dL   RDW 14.2 11.5 - 15.5 %   Platelets 242 150 - 400 K/uL   nRBC 0.0 0.0 - 0.2 %  Basic metabolic panel  Result Value Ref Range   Sodium 141 135 - 145 mmol/L   Potassium 4.7 3.5 - 5.1 mmol/L   Chloride 105 98 - 111 mmol/L   CO2 28 22 - 32 mmol/L   Glucose, Bld 131 (H) 70 - 99 mg/dL   BUN 25 (H) 8 - 23 mg/dL   Creatinine, Ser 0.93 0.44 - 1.00 mg/dL   Calcium 8.9 8.9 - 10.3 mg/dL   GFR calc non Af Amer >60 >60 mL/min   GFR calc Af Amer >60 >60 mL/min   Anion gap 8 5 - 15  CBC  Result Value Ref Range   WBC 9.8 4.0 - 10.5 K/uL   RBC 3.51 (L) 3.87 - 5.11 MIL/uL   Hemoglobin 10.7 (L) 12.0 - 15.0 g/dL   HCT  32.3 (L) 36 - 46 %   MCV 92.0 80.0 - 100.0 fL   MCH 30.5 26.0 - 34.0 pg   MCHC 33.1 30.0 - 36.0 g/dL   RDW 14.5 11.5 - 15.5 %   Platelets 182 150 - 400 K/uL   nRBC 0.0 0.0 - 0.2 %  Basic metabolic panel  Result Value Ref Range   Sodium 141 135 - 145 mmol/L   Potassium 3.7 3.5 - 5.1 mmol/L   Chloride 106 98 - 111 mmol/L   CO2 28 22 - 32 mmol/L   Glucose, Bld 96 70 - 99 mg/dL   BUN 26 (H) 8 - 23 mg/dL   Creatinine, Ser 1.02 (H) 0.44 - 1.00 mg/dL   Calcium 7.9 (L) 8.9 - 10.3 mg/dL   GFR calc non Af Amer 56 (L) >60 mL/min   GFR calc Af Amer >60 >60 mL/min   Anion gap 7 5 - 15  CBC  Result Value Ref Range   WBC 11.6 (H) 4.0 - 10.5 K/uL   RBC 3.43 (L) 3.87 - 5.11 MIL/uL   Hemoglobin 10.3 (L) 12.0 - 15.0 g/dL   HCT 31.6 (L) 36 - 46 %   MCV 92.1 80.0 - 100.0 fL   MCH 30.0 26.0 - 34.0 pg   MCHC 32.6 30.0 - 36.0 g/dL   RDW 14.6 11.5 - 15.5 %   Platelets 175 150 - 400 K/uL   nRBC 0.0 0.0 - 0.2 %  Basic metabolic panel  Result Value Ref Range   Sodium 141 135 - 145 mmol/L   Potassium 4.1 3.5 - 5.1 mmol/L   Chloride 107 98 - 111 mmol/L   CO2 29 22 - 32 mmol/L   Glucose, Bld 106 (H) 70 - 99 mg/dL   BUN 22 8 - 23 mg/dL   Creatinine, Ser 0.85 0.44 - 1.00 mg/dL   Calcium 7.7 (L) 8.9 - 10.3 mg/dL   GFR calc non Af Amer >60 >60 mL/min   GFR calc Af Amer >60 >60 mL/min   Anion  gap 5 5 - 15  Hemoglobin A1c  Result Value Ref Range   Hgb A1c MFr Bld 5.9 (H) 4.8 - 5.6 %   Mean Plasma Glucose 122.63 mg/dL  Lipid panel  Result Value Ref Range   Cholesterol 142 0 - 200 mg/dL   Triglycerides 82 <150 mg/dL   HDL 44 >40 mg/dL   Total CHOL/HDL Ratio 3.2 RATIO   VLDL 16 0 - 40 mg/dL   LDL Cholesterol 82 0 - 99 mg/dL  Urinalysis, Complete w Microscopic  Result Value Ref Range   Color, Urine YELLOW (A) YELLOW   APPearance HAZY (A) CLEAR   Specific Gravity, Urine 1.023 1.005 - 1.030   pH 5.0 5.0 - 8.0   Glucose, UA NEGATIVE NEGATIVE mg/dL   Hgb urine dipstick NEGATIVE NEGATIVE    Bilirubin Urine NEGATIVE NEGATIVE   Ketones, ur NEGATIVE NEGATIVE mg/dL   Protein, ur NEGATIVE NEGATIVE mg/dL   Nitrite NEGATIVE NEGATIVE   Leukocytes,Ua NEGATIVE NEGATIVE   RBC / HPF 0-5 0 - 5 RBC/hpf   WBC, UA 0-5 0 - 5 WBC/hpf   Bacteria, UA NONE SEEN NONE SEEN   Squamous Epithelial / LPF NONE SEEN 0 - 5   Mucus PRESENT   CBC  Result Value Ref Range   WBC 11.5 (H) 4.0 - 10.5 K/uL   RBC 3.29 (L) 3.87 - 5.11 MIL/uL   Hemoglobin 9.9 (L) 12.0 - 15.0 g/dL   HCT 30.2 (L) 36 - 46 %   MCV 91.8 80.0 - 100.0 fL   MCH 30.1 26.0 - 34.0 pg   MCHC 32.8 30.0 - 36.0 g/dL   RDW 14.4 11.5 - 15.5 %   Platelets 165 150 - 400 K/uL   nRBC 0.0 0.0 - 0.2 %  Basic metabolic panel  Result Value Ref Range   Sodium 138 135 - 145 mmol/L   Potassium 4.0 3.5 - 5.1 mmol/L   Chloride 105 98 - 111 mmol/L   CO2 27 22 - 32 mmol/L   Glucose, Bld 106 (H) 70 - 99 mg/dL   BUN 23 8 - 23 mg/dL   Creatinine, Ser 0.91 0.44 - 1.00 mg/dL   Calcium 7.9 (L) 8.9 - 10.3 mg/dL   GFR calc non Af Amer >60 >60 mL/min   GFR calc Af Amer >60 >60 mL/min   Anion gap 6 5 - 15  CBC  Result Value Ref Range   WBC 11.4 (H) 4.0 - 10.5 K/uL   RBC 3.35 (L) 3.87 - 5.11 MIL/uL   Hemoglobin 10.3 (L) 12.0 - 15.0 g/dL   HCT 30.5 (L) 36 - 46 %   MCV 91.0 80.0 - 100.0 fL   MCH 30.7 26.0 - 34.0 pg   MCHC 33.8 30.0 - 36.0 g/dL   RDW 14.3 11.5 - 15.5 %   Platelets 202 150 - 400 K/uL   nRBC 0.0 0.0 - 0.2 %  Basic metabolic panel  Result Value Ref Range   Sodium 140 135 - 145 mmol/L   Potassium 5.1 3.5 - 5.1 mmol/L   Chloride 107 98 - 111 mmol/L   CO2 29 22 - 32 mmol/L   Glucose, Bld 111 (H) 70 - 99 mg/dL   BUN 18 8 - 23 mg/dL   Creatinine, Ser 0.88 0.44 - 1.00 mg/dL   Calcium 8.6 (L) 8.9 - 10.3 mg/dL   GFR calc non Af Amer >60 >60 mL/min   GFR calc Af Amer >60 >60 mL/min   Anion gap 4 (L) 5 -  15  Magnesium  Result Value Ref Range   Magnesium 1.9 1.7 - 2.4 mg/dL  Glucose, capillary  Result Value Ref Range   Glucose-Capillary  139 (H) 70 - 99 mg/dL  CBC  Result Value Ref Range   WBC 10.8 (H) 4.0 - 10.5 K/uL   RBC 3.50 (L) 3.87 - 5.11 MIL/uL   Hemoglobin 10.6 (L) 12.0 - 15.0 g/dL   HCT 32.4 (L) 36 - 46 %   MCV 92.6 80.0 - 100.0 fL   MCH 30.3 26.0 - 34.0 pg   MCHC 32.7 30.0 - 36.0 g/dL   RDW 14.0 11.5 - 15.5 %   Platelets 233 150 - 400 K/uL   nRBC 0.0 0.0 - 0.2 %  Comprehensive metabolic panel  Result Value Ref Range   Sodium 137 135 - 145 mmol/L   Potassium 4.4 3.5 - 5.1 mmol/L   Chloride 103 98 - 111 mmol/L   CO2 25 22 - 32 mmol/L   Glucose, Bld 100 (H) 70 - 99 mg/dL   BUN 16 8 - 23 mg/dL   Creatinine, Ser 1.01 (H) 0.44 - 1.00 mg/dL   Calcium 8.1 (L) 8.9 - 10.3 mg/dL   Total Protein 5.9 (L) 6.5 - 8.1 g/dL   Albumin 2.8 (L) 3.5 - 5.0 g/dL   AST 19 15 - 41 U/L   ALT 15 0 - 44 U/L   Alkaline Phosphatase 68 38 - 126 U/L   Total Bilirubin 0.6 0.3 - 1.2 mg/dL   GFR calc non Af Amer 57 (L) >60 mL/min   GFR calc Af Amer >60 >60 mL/min   Anion gap 9 5 - 15  CBC  Result Value Ref Range   WBC 8.9 4.0 - 10.5 K/uL   RBC 3.23 (L) 3.87 - 5.11 MIL/uL   Hemoglobin 9.8 (L) 12.0 - 15.0 g/dL   HCT 29.9 (L) 36 - 46 %   MCV 92.6 80.0 - 100.0 fL   MCH 30.3 26.0 - 34.0 pg   MCHC 32.8 30.0 - 36.0 g/dL   RDW 13.9 11.5 - 15.5 %   Platelets 245 150 - 400 K/uL   nRBC 0.0 0.0 - 0.2 %  Comprehensive metabolic panel  Result Value Ref Range   Sodium 141 135 - 145 mmol/L   Potassium 4.5 3.5 - 5.1 mmol/L   Chloride 106 98 - 111 mmol/L   CO2 29 22 - 32 mmol/L   Glucose, Bld 106 (H) 70 - 99 mg/dL   BUN 14 8 - 23 mg/dL   Creatinine, Ser 0.83 0.44 - 1.00 mg/dL   Calcium 8.2 (L) 8.9 - 10.3 mg/dL   Total Protein 5.5 (L) 6.5 - 8.1 g/dL   Albumin 2.5 (L) 3.5 - 5.0 g/dL   AST 16 15 - 41 U/L   ALT 14 0 - 44 U/L   Alkaline Phosphatase 61 38 - 126 U/L   Total Bilirubin 0.6 0.3 - 1.2 mg/dL   GFR calc non Af Amer >60 >60 mL/min   GFR calc Af Amer >60 >60 mL/min   Anion gap 6 5 - 15  ABO/Rh  Result Value Ref Range    ABO/RH(D)      B NEG Performed at [facility], [address] [place], [place] [address]   Type and screen  Result Value Ref Range   ABO/RH(D) B NEG    Antibody Screen NEG    Sample Expiration      12/19/2019,2359 Performed at [facility], [place]  [address], [place], [place] [address]   Surgical pathology  Result Value Ref Range   SURGICAL PATHOLOGY      SURGICAL PATHOLOGY CASE: [id number] [phone] PATIENT: Sherolyn Buba Surgical Pathology Report     Specimen Submitted: A. Femoral head, right  Clinical History: right hip osteoarthritis     DIAGNOSIS: A. FEMORAL HEAD, RIGHT; TOTAL HIP ARTHROPLASTY: - OSTEONECROSIS WITH MARKED SECONDARY DEGENERATIVE CHANGES.  GROSS DESCRIPTION: A. Labeled: Right femoral head Received: Formalin Size of specimen:      Head -5 x 4.7 x 4.3 cm      Neck -5.2 x 3.5 x 2.5 cm      Additional tissue: 4.5 x 2.5 x 1.1 cm Articular surface: The articular surface is tan-white and lobulated with a 4.7 x 3.5 cm area of eburnation with pitting. Adjacent to this area the cartilage is jagged and slightly pulled away from the underlying bone. Cut surface: The bone underlying the area of eburnation is tan-white and firm.  The bone underlying the area of raised cartilage is erythematous. Other findings:  Block summary: 1 - representative eburnation, following decalcification 2 - representative ar ea of cartilage pulled away from bone with underlying erythema, following decalcification  Tissue decalcification: Yes, cassettes 1-2   Final Diagnosis performed by Bryan Lemma, MD.   Electronically signed 12/21/2019 5:23:53PM The electronic signature indicates that the named Attending Pathologist has evaluated the specimen Technical component performed at [facility], [address] [place], [place] [address] Lab: [phone] Dir: Rush Farmer, MD, MMM  Professional component performed at [facility], [facility], [place], [place], [place] [address] Lab: [phone] Dir: Dellia Nims. Rubinas, MD   Troponin I (High Sensitivity)  Result Value Ref Range   Troponin I (High Sensitivity) 5 <18 ng/L  Troponin I (High Sensitivity)  Result Value Ref Range   Troponin I (High Sensitivity) 4 <18 ng/L      Assessment & Plan:   Problem List Items Addressed This Visit      Cardiovascular and Mediastinum   Essential hypertension - Primary    Controlled hypertension.  BP is at goal < 130/80.  Patient reports having stopped taking lisinopril-HCTZ 20-25mg  upon leaving the nursing rehab. Complications: Obesity, hyperlipidemia  Plan: 1. Encouraged heart healthy diet and increasing exercise to 30 minutes most days of the week, going no more than 2 days in a row without exercise. 2. Check BP 1-2 x per week at home, keep log, and bring to clinic at next appointment. 3. Follow up 3 months.         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  Plan: 1. Continue lovenox 80mg  BID 2. RTC in 3 months 3. Keep scheduled post-operative appointments with all specialists      Relevant Medications   atorvastatin (LIPITOR) 20 MG tablet   enoxaparin (LOVENOX) 80 MG/0.8ML injection     Respiratory   Seasonal asthma    Stable and well controlled, not currently using.  Reports has inhaler refill at home.  No acute concerns.        Other   Reactive depression    Stable and well controlled on celexa 20mg  daily and continues to tolerate well.  Requesting refill.  Refill sent to pharmacy on file.  Will RTC in  3 months, sooner, should the need arise.      Relevant Medications   citalopram (CELEXA) 20 MG tablet   Lumbar stenosis with neurogenic claudication    Stable and well controlled with current  treatment of gabapentin 300mg  1 capsule in the morning and 2 capsules at bedtime.  Continue, refill sent to pharmacy on file.      Relevant Medications   citalopram (CELEXA) 20 MG tablet   gabapentin (NEURONTIN) 300 MG capsule   HYDROcodone-acetaminophen (NORCO) 5-325 MG tablet   Hospital discharge follow-up    HFU post discharge from [facility] and nursing rehab.  Reports doing well, ambulating with walker, has needs for medication refills, has post-operative follow up visit with Dr. Rudene Christians with [facility] on 01/27/2020 and 02/01/2020.      Post-op pain    Reports post-operative pain and has run out of her hydrocodone.  Requesting refill until she is able to get to her next post-operative follow up visit with Dr. Rudene Christians on 01/27/2020 and 02/01/2020.  Refilled and checked PDMP, is appropriate for refill.  Sent message to Dr. Rudene Christians to confirm treatment plan for pain post-operatively and if he would continue to write for this.      Relevant Medications   HYDROcodone-acetaminophen (NORCO) 5-325 MG tablet   Hyperlipidemia    Stable and well controlled with atorvastatin 20mg  daily.  Refill resent to pharmacy on file.      Relevant Medications   atorvastatin (LIPITOR) 20 MG tablet   enoxaparin (LOVENOX) 80 MG/0.8ML injection   Vertigo    Reports stable and well controlled with meclizine 12.5mg  TID PRN.  Requesting refill.  Refill sent to pharmacy on file.      Relevant Medications   meclizine (ANTIVERT) 12.5 MG tablet       Meds ordered this encounter  Medications  . atorvastatin (LIPITOR) 20 MG tablet    Sig: Take 1 tablet (20 mg total) by mouth daily.    Dispense:  90 tablet    Refill:  1  . citalopram (CELEXA) 20 MG tablet    Sig: Take 1 tablet (20 mg total) by mouth daily.    Dispense:  90 tablet    Refill:  0  . DISCONTD: apixaban (ELIQUIS) 5 MG TABS tablet    Sig: Take 1 tablet (5 mg total) by mouth 2 (two) times daily.    Dispense:  60 tablet    Refill:  1  . gabapentin  (NEURONTIN) 300 MG capsule    Sig: Take 1 capsule (300 mg total) by mouth as directed. One capsule in the morning and two capsules every day at bedtime    Dispense:  270 capsule    Refill:  1  . meclizine (ANTIVERT) 12.5 MG tablet    Sig: Take 1 tablet (12.5 mg total) by mouth 3 (three) times daily.    Dispense:  90 tablet    Refill:  0  . HYDROcodone-acetaminophen (NORCO) 5-325 MG tablet    Sig: Take 1-2 tablets by mouth every 6 (six) hours as needed for up to 7 days for moderate pain.    Dispense:  56 tablet    Refill:  0  . DISCONTD: enoxaparin (LOVENOX) 40 MG/0.4ML injection    Sig: Inject 0.4 mLs (40 mg total) into the skin daily.    Dispense:  36 mL    Refill:  0  . enoxaparin (LOVENOX) 80 MG/0.8ML injection    Sig: Inject 0.8 mLs (80 mg total) into the skin every 12 (  twelve) hours.    Dispense:  48 mL    Refill:  0    For DVT    Follow up plan: Return in about 4 weeks (around 02/11/2020) for Follow up visit.  Harlin Rain, FNP-C Family Nurse Practitioner [facility] 01/14/2020, 3:06 PM

## 2020-01-14 NOTE — Patient Instructions (Addendum)
Your medication refills have been sent to your pharmacy on file.  As we discussed, I can fill your pain medication weekly until you get in to see Dr. Rudene Christians again on 02/01/2020.  We will plan to see you back in 4 weeks for follow up visit  You will receive a survey after today's visit either digitally by e-mail or paper by Gantt mail. Your experiences and feedback matter to [place].  Please respond so we know how we are doing as we provide care for you.  Call us with any questions/concerns/needs.  It is my goal to be available to you for your health concerns.  Thanks for choosing me to be a partner in your healthcare needs!  Harlin Rain, FNP-C Family Nurse Practitioner [facility] Phone: [phone]

## 2020-01-18 ENCOUNTER — Telehealth: Payer: Self-pay | Admitting: Family Medicine

## 2020-01-18 NOTE — Telephone Encounter (Signed)
Re CRM  This a.m. regard script.. Reason for CRM: Pt called saying she needs the Hydrocodone sent to the pharmacy. Pt has found, hard copy given, is taking to pharmacy now, states sorry for trouble

## 2020-01-18 NOTE — Telephone Encounter (Signed)
Unable to reach the patient left message since tramadol were not Rx by Elmy[facility] Ricks ?

## 2020-01-18 NOTE — Telephone Encounter (Signed)
Covering inbox for Linda Skeeters, FNP while she is out of office.  I am not involved with this patient's recent care, and have looked through her chart for more information.  This question should be asked to the provider who is writing her pain medication.  She has seen Dr [facility] for her Total Hip Arthroplasty surgery 12/2019.  She also was followed by Dr Sharlet Salina [facility]) in past and they were prescribing pain medication.  She should contact these doctors regarding her pain medicine, and which rx to fill.  It is up to her, I would recommend she only take ONE pain medicine at a time, and not take both Hydrocodone and tramadol. She should follow their advice.  Linda Farley, [facility] 01/18/2020, 12:17 PM

## 2020-01-18 NOTE — Telephone Encounter (Signed)
Spoke to the patient and informed as per Dr Raliegh Ip she wanted to know about taking both pain together and advised as per Dr Raliegh Ip she does not have any more question.

## 2020-01-18 NOTE — Telephone Encounter (Signed)
-  Pt called back to clarify that she is only wanting to know if Elmyra Ricks approves of this medication, she is not requesting it. It has been prescribed by another Dr. Marland Kitchen she just wants to know if PCP approves of her using this or not. Please advise  Best contact: [phone] (cell)

## 2020-01-18 NOTE — Telephone Encounter (Signed)
Pt called and is requesting to know if it is okay for her to pick up the prescription for tramadol from another provider. Please advise.

## 2020-01-19 DIAGNOSIS — J45998 Other asthma: Secondary | ICD-10-CM | POA: Insufficient documentation

## 2020-01-19 DIAGNOSIS — E785 Hyperlipidemia, unspecified: Secondary | ICD-10-CM | POA: Insufficient documentation

## 2020-01-19 DIAGNOSIS — R42 Dizziness and giddiness: Secondary | ICD-10-CM | POA: Insufficient documentation

## 2020-01-19 DIAGNOSIS — G8918 Other acute postprocedural pain: Secondary | ICD-10-CM | POA: Insufficient documentation

## 2020-01-19 DIAGNOSIS — I82561 Chronic embolism and thrombosis of right calf muscular vein: Secondary | ICD-10-CM | POA: Insufficient documentation

## 2020-01-19 NOTE — Assessment & Plan Note (Signed)
Controlled hypertension.  BP is at goal < 130/80.  Patient reports having stopped taking lisinopril-HCTZ 20-25mg  upon leaving the nursing rehab. Complications: Obesity, hyperlipidemia  Plan: 1. Encouraged heart healthy diet and increasing exercise to 30 minutes most days of the week, going no more than 2 days in a row without exercise. 2. Check BP 1-2 x per week at home, keep log, and bring to clinic at next appointment. 3. Follow up 3 months.

## 2020-01-19 NOTE — Assessment & Plan Note (Signed)
Currently on Lovenox 80mg  SubQ BID x 3 months for DVT after surgery.  Plan: 1. Continue lovenox 80mg  BID 2. RTC in 3 months 3. Keep scheduled post-operative appointments with all specialists

## 2020-01-19 NOTE — Telephone Encounter (Signed)
Sent message to Dr. Rudene Christians for update on pain medication requests and his post-operative tx plan

## 2020-01-20 DIAGNOSIS — Z96641 Presence of right artificial hip joint: Secondary | ICD-10-CM | POA: Diagnosis not present

## 2020-01-20 DIAGNOSIS — Z9181 History of falling: Secondary | ICD-10-CM | POA: Diagnosis not present

## 2020-01-20 DIAGNOSIS — L299 Pruritus, unspecified: Secondary | ICD-10-CM | POA: Diagnosis not present

## 2020-01-20 DIAGNOSIS — I82409 Acute embolism and thrombosis of unspecified deep veins of unspecified lower extremity: Secondary | ICD-10-CM | POA: Diagnosis not present

## 2020-01-20 DIAGNOSIS — Z471 Aftercare following joint replacement surgery: Secondary | ICD-10-CM | POA: Diagnosis not present

## 2020-01-20 DIAGNOSIS — Z79891 Long term (current) use of opiate analgesic: Secondary | ICD-10-CM | POA: Diagnosis not present

## 2020-01-20 DIAGNOSIS — I10 Essential (primary) hypertension: Secondary | ICD-10-CM | POA: Diagnosis not present

## 2020-01-20 DIAGNOSIS — K219 Gastro-esophageal reflux disease without esophagitis: Secondary | ICD-10-CM | POA: Diagnosis not present

## 2020-01-20 DIAGNOSIS — E559 Vitamin D deficiency, unspecified: Secondary | ICD-10-CM | POA: Diagnosis not present

## 2020-01-20 DIAGNOSIS — M4800 Spinal stenosis, site unspecified: Secondary | ICD-10-CM | POA: Diagnosis not present

## 2020-01-20 DIAGNOSIS — E785 Hyperlipidemia, unspecified: Secondary | ICD-10-CM | POA: Diagnosis not present

## 2020-01-20 DIAGNOSIS — Z8673 Personal history of transient ischemic attack (TIA), and cerebral infarction without residual deficits: Secondary | ICD-10-CM | POA: Diagnosis not present

## 2020-01-20 DIAGNOSIS — F418 Other specified anxiety disorders: Secondary | ICD-10-CM | POA: Diagnosis not present

## 2020-01-20 NOTE — Assessment & Plan Note (Signed)
Stable and well controlled on celexa 20mg  daily and continues to tolerate well.  Requesting refill.  Refill sent to pharmacy on file.  Will RTC in 3 months, sooner, should the need arise.

## 2020-01-20 NOTE — Assessment & Plan Note (Signed)
Stable and well controlled with atorvastatin 20mg  daily.  Refill resent to pharmacy on file.

## 2020-01-20 NOTE — Assessment & Plan Note (Signed)
HFU post discharge from [facility] and nursing rehab.  Reports doing well, ambulating with walker, has needs for medication refills, has post-operative follow up visit with Dr. Rudene Christians with [facility] on 01/27/2020 and 02/01/2020.

## 2020-01-20 NOTE — Assessment & Plan Note (Signed)
Reports stable and well controlled with meclizine 12.5mg  TID PRN.  Requesting refill.  Refill sent to pharmacy on file.

## 2020-01-20 NOTE — Assessment & Plan Note (Signed)
Reports post-operative pain and has run out of her hydrocodone.  Requesting refill until she is able to get to her next post-operative follow up visit with Dr. Rudene Christians on 01/27/2020 and 02/01/2020.  Refilled and checked PDMP, is appropriate for refill.  Sent message to Dr. Rudene Christians to confirm treatment plan for pain post-operatively and if he would continue to write for this.

## 2020-01-20 NOTE — Assessment & Plan Note (Signed)
Stable and well controlled with current treatment of gabapentin 300mg  1 capsule in the morning and 2 capsules at bedtime.  Continue, refill sent to pharmacy on file.

## 2020-01-20 NOTE — Assessment & Plan Note (Signed)
Stable and well controlled, not currently using.  Reports has inhaler refill at home.  No acute concerns.

## 2020-01-27 ENCOUNTER — Telehealth: Payer: Self-pay

## 2020-01-27 NOTE — Telephone Encounter (Signed)
Call to [facility], Dr. Rudene Christians office [phone], office will reopen at 1pm.  To call and discuss patient's post-operative pain medication management before additional refills.

## 2020-01-27 NOTE — Telephone Encounter (Signed)
Telephone call to Dr. Rudene Christians at [facility] in [place], reports patient has not completed her post-operative appointment and they typically refill hydrocodone pain medication through 1 month post-operatively and then discontinue, would not recommend refill and encourage patient to call the office to schedule her post-operative appointment.  Telephone all to patient and verbalized information received from Dr. Rudene Christians.  Patient verbalized understanding and will contact [facility] in [place] to schedule her post-operative appointment.

## 2020-01-27 NOTE — Telephone Encounter (Signed)
Copied from [place] [id number]. Topic: General - Other >> Jan 26, 2020  3:45 PM Keene Breath wrote: Reason for CRM: Patient called to ask when she could come by to pick up the script for the hydrocodone.  If there are any questions, please call back at [phone]

## 2020-02-01 DIAGNOSIS — Z96641 Presence of right artificial hip joint: Secondary | ICD-10-CM | POA: Diagnosis not present

## 2020-02-02 ENCOUNTER — Ambulatory Visit: Payer: PPO

## 2020-02-02 ENCOUNTER — Ambulatory Visit: Payer: PPO | Admitting: Family Medicine

## 2020-02-03 DIAGNOSIS — M7552 Bursitis of left shoulder: Secondary | ICD-10-CM | POA: Diagnosis not present

## 2020-02-03 DIAGNOSIS — M5416 Radiculopathy, lumbar region: Secondary | ICD-10-CM | POA: Diagnosis not present

## 2020-02-03 DIAGNOSIS — M48062 Spinal stenosis, lumbar region with neurogenic claudication: Secondary | ICD-10-CM | POA: Diagnosis not present

## 2020-02-03 DIAGNOSIS — M5136 Other intervertebral disc degeneration, lumbar region: Secondary | ICD-10-CM | POA: Diagnosis not present

## 2020-02-03 DIAGNOSIS — M7551 Bursitis of right shoulder: Secondary | ICD-10-CM | POA: Diagnosis not present

## 2020-02-03 DIAGNOSIS — M1611 Unilateral primary osteoarthritis, right hip: Secondary | ICD-10-CM | POA: Diagnosis not present

## 2020-02-10 ENCOUNTER — Telehealth: Payer: Self-pay

## 2020-02-10 NOTE — Telephone Encounter (Signed)
Copied from [place] [id number]. Topic: General - Other >> Feb 10, 2020 12:20 PM Rainey Pines A wrote: Patient would like a callback in regards to getting a bone density exam set up. Please advise [phone]

## 2020-02-11 ENCOUNTER — Ambulatory Visit (INDEPENDENT_AMBULATORY_CARE_PROVIDER_SITE_OTHER): Payer: PPO | Admitting: Family Medicine

## 2020-02-11 ENCOUNTER — Encounter: Payer: Self-pay | Admitting: Family Medicine

## 2020-02-11 ENCOUNTER — Other Ambulatory Visit: Payer: Self-pay

## 2020-02-11 VITALS — BP 185/91 | HR 99 | Temp 99.2°F | Resp 17 | Ht 65.0 in | Wt 206.2 lb

## 2020-02-11 DIAGNOSIS — Z13820 Encounter for screening for osteoporosis: Secondary | ICD-10-CM | POA: Diagnosis not present

## 2020-02-11 DIAGNOSIS — R42 Dizziness and giddiness: Secondary | ICD-10-CM | POA: Diagnosis not present

## 2020-02-11 DIAGNOSIS — R7303 Prediabetes: Secondary | ICD-10-CM

## 2020-02-11 DIAGNOSIS — M5136 Other intervertebral disc degeneration, lumbar region: Secondary | ICD-10-CM

## 2020-02-11 DIAGNOSIS — I10 Essential (primary) hypertension: Secondary | ICD-10-CM | POA: Diagnosis not present

## 2020-02-11 DIAGNOSIS — T466X5A Adverse effect of antihyperlipidemic and antiarteriosclerotic drugs, initial encounter: Secondary | ICD-10-CM | POA: Insufficient documentation

## 2020-02-11 LAB — POCT GLYCOSYLATED HEMOGLOBIN (HGB A1C): Hemoglobin A1C: 5.3 % (ref 4.0–5.6)

## 2020-02-11 MED ORDER — MECLIZINE HCL 12.5 MG PO TABS: 12.5000 mg | ORAL_TABLET | Freq: Three times a day (TID) | ORAL | 0 refills | Status: AC

## 2020-02-11 MED ORDER — LISINOPRIL-HYDROCHLOROTHIAZIDE 20-25 MG PO TABS: 1.0000 | ORAL_TABLET | Freq: Every day | ORAL | 3 refills | Status: DC

## 2020-02-11 MED ORDER — CELECOXIB 100 MG PO CAPS: 100.0000 mg | ORAL_CAPSULE | Freq: Two times a day (BID) | ORAL | 1 refills | Status: DC

## 2020-02-11 NOTE — Assessment & Plan Note (Signed)
A1C 5.3% today, improved from 5.9% on 12/19/2019.  Continue working on lifestyle modifications.

## 2020-02-11 NOTE — Assessment & Plan Note (Signed)
Atorvastatin caused yellow stool and myopathy.  Since stopping atorvastatin, both have resolved

## 2020-02-11 NOTE — Progress Notes (Signed)
Subjective:    Patient ID: DANAYAH SMYRE, female    DOB: [birth year], 69 y.o.   MRN: [id number]  HAYDEN MABIN is a 69 y.o. female presenting on 02/11/2020 for Hypertension (pt requesting bone density. Pt want to discuss starting on Celebrex to help with pain. She also discontinued the Lipitor because she was having issues with restless legs and yellow stool. Since stoppin the medication she notice the yellow stool subsided. ) and Edema (pt concern about fluid retention, mostly in the legs. Post surgery)   HPI   Ms. Sweeney presents to clinic for follow up on her hypertension, medication concern and request for bone density testing.  Reports she had stopped her lisinopril-HCTZ after leaving rehab and her blood pressure was well regulated, as of recent it has been elevated with lower extremity swelling, is interested in returning to her lisinopril-HCTZ dosage.  Has concerns for taking her atorvastatin 20mg , causing myalgia and yellow stools.  Reports once she stopped the atorvastatin the discolored stools have resolved and her myalgias have subsided.  Depression screen [facility] 2/9 08/04/2019 03/09/2019  Decreased Interest 1 1  Down, Depressed, Hopeless 0 1  PHQ - 2 Score 1 2  Altered sleeping 3 1  Tired, decreased energy 3 1  Change in appetite 0 0  Feeling bad or failure about yourself  0 0  Trouble concentrating 1 0  Moving slowly or fidgety/restless 0 0  Suicidal thoughts 0 0  PHQ-9 Score 8 4  Difficult doing work/chores Somewhat difficult Somewhat difficult    Social History   Tobacco Use  . Smoking status: Never Smoker  . Smokeless tobacco: Never Used  Vaping Use  . Vaping Use: Never used  Substance Use Topics  . Alcohol use: Yes    Alcohol/week: 1.0 standard drink    Types: 1 Glasses of wine per week    Comment: 3 wine coolers weekly  . Drug use: Never    Review of Systems  Constitutional: Negative.   HENT: Negative.   Eyes: Negative.   Respiratory: Negative.     Cardiovascular: Positive for leg swelling. Negative for chest pain and palpitations.  Gastrointestinal: Negative.   Endocrine: Negative.   Genitourinary: Negative.   Musculoskeletal: Negative.   Skin: Negative.   Allergic/Immunologic: Negative.   Neurological: Negative.   Hematological: Negative.   Psychiatric/Behavioral: Negative.    Per HPI unless specifically indicated above     Objective:    BP (!) 185/91 (BP Location: Right Arm, Patient Position: Sitting, Cuff Size: Normal)   Pulse 99   Temp 99.2 F (37.3 C) (Oral)   Resp 17   Ht 5\' 5"  (1.651 m)   Wt 206 lb 3.2 oz (93.5 kg)   SpO2 99%   BMI 34.31 kg/m   Wt Readings from Last 3 Encounters:  02/11/20 206 lb 3.2 oz (93.5 kg)  01/14/20 194 lb 12.8 oz (88.4 kg)  12/14/19 180 lb (81.6 kg)    Physical Exam Vitals reviewed.  Constitutional:      General: She is not in acute distress.    Appearance: Normal appearance. She is well-developed and well-groomed. She is obese. She is not ill-appearing or toxic-appearing.  HENT:     Head: Normocephalic and atraumatic.     Nose:     Comments: Lizbeth Bark is in place, covering mouth and nose. Eyes:     General: Lids are normal. Vision grossly intact.        Right eye: No discharge.  Left eye: No discharge.     Extraocular Movements: Extraocular movements intact.     Conjunctiva/sclera: Conjunctivae normal.     Pupils: Pupils are equal, round, and reactive to light.  Cardiovascular:     Rate and Rhythm: Normal rate and regular rhythm.     Pulses: Normal pulses.          Dorsalis pedis pulses are 2+ on the right side and 2+ on the left side.     Heart sounds: Normal heart sounds. No murmur heard.  No friction rub. No gallop.   Pulmonary:     Effort: Pulmonary effort is normal. No respiratory distress.     Breath sounds: Normal breath sounds.  Musculoskeletal:     Right lower leg: 1+ Edema present.     Left lower leg: 1+ Edema present.  Skin:    General: Skin is warm  and dry.     Capillary Refill: Capillary refill takes less than 2 seconds.  Neurological:     General: No focal deficit present.     Mental Status: She is alert and oriented to person, place, and time.  Psychiatric:        Attention and Perception: Attention and perception normal.        Mood and Affect: Mood and affect normal.        Speech: Speech normal.        Behavior: Behavior normal. Behavior is cooperative.        Thought Content: Thought content normal.        Cognition and Memory: Cognition and memory normal.        Judgment: Judgment normal.    Results for orders placed or performed in visit on 02/11/20  POCT glycosylated hemoglobin (Hb A1C)  Result Value Ref Range   Hemoglobin A1C 5.3 4.0 - 5.6 %   HbA1c POC (<> result, manual entry)     HbA1c, POC (prediabetic range)     HbA1c, POC (controlled diabetic range)        Assessment & Plan:   Problem List Items Addressed This Visit      Cardiovascular and Mediastinum   Essential hypertension - Primary    Uncontrolled hypertension.  BP is not at goal < 130/80.  Pt is not working on lifestyle modifications.  Taking medications tolerating well without side effects. Encouraged to return to clinic in 2 weeks for recheck on BP since restarting on lisinopril-HCTZ, patient declines, reports will call if BP > 130/80. Complications:  Obesity, Prediabetes, HLD  Plan: 1. RESTART on lisinopril-HCTZ 20-25mg  daily 2. Obtain labs in 6 months  3. Encouraged heart healthy diet and increasing exercise to 30 minutes most days of the week, going no more than 2 days in a row without exercise. 4. Check BP 1-2 x per week at home, keep log, and bring to clinic at next appointment. 5. Follow up 3 months.         Relevant Medications   aspirin EC 81 MG tablet   lisinopril-hydrochlorothiazide (ZESTORETIC) 20-25 MG tablet     Musculoskeletal and Integument   DDD (degenerative disc disease), lumbar    Requesting to start on Celebrex at  direction of her orthopedic provider.  Plan: 1. Can begin celebrex 100mg  BID for chronic pain      Relevant Medications   HYDROcodone-acetaminophen (NORCO/VICODIN) 5-325 MG tablet   traMADol (ULTRAM) 50 MG tablet   aspirin EC 81 MG tablet   celecoxib (CELEBREX) 100 MG capsule  Other   Prediabetes    A1C 5.3% today, improved from 5.9% on 12/19/2019.  Continue working on lifestyle modifications.      Relevant Orders   POCT glycosylated hemoglobin (Hb A1C) (Completed)   Vertigo    Reports stable and well controlled with meclizine 12.5mg  TID PRN.  Requesting refill.  Refill sent to pharmacy on file.      Relevant Medications   meclizine (ANTIVERT) 12.5 MG tablet   Adverse reaction to antihyperlipidemic drug, initial encounter    Atorvastatin caused yellow stool and myopathy.  Since stopping atorvastatin, both have resolved      Screening for osteoporosis    Pt postmenopausal w/o history of prior DEXA scan.    Plan: 1. Obtain DG bone density.         Relevant Orders   DG Bone Density      Meds ordered this encounter  Medications  . lisinopril-hydrochlorothiazide (ZESTORETIC) 20-25 MG tablet    Sig: Take 1 tablet by mouth daily.    Dispense:  90 tablet    Refill:  3  . celecoxib (CELEBREX) 100 MG capsule    Sig: Take 1 capsule (100 mg total) by mouth 2 (two) times daily.    Dispense:  180 capsule    Refill:  1  . meclizine (ANTIVERT) 12.5 MG tablet    Sig: Take 1 tablet (12.5 mg total) by mouth 3 (three) times daily.    Dispense:  90 tablet    Refill:  0   Follow up plan: Return in about 3 months (around 05/12/2020) for Hypertension follow up visit.   Harlin Rain, [facility] Nurse Practitioner [facility] 02/11/2020, 4:48 PM

## 2020-02-11 NOTE — Assessment & Plan Note (Signed)
Uncontrolled hypertension.  BP is not at goal < 130/80.  Pt is not working on lifestyle modifications.  Taking medications tolerating well without side effects. Encouraged to return to clinic in 2 weeks for recheck on BP since restarting on lisinopril-HCTZ, patient declines, reports will call if BP > 130/80. Complications:  Obesity, Prediabetes, HLD  Plan: 1. RESTART on lisinopril-HCTZ 20-25mg  daily 2. Obtain labs in 6 months  3. Encouraged heart healthy diet and increasing exercise to 30 minutes most days of the week, going no more than 2 days in a row without exercise. 4. Check BP 1-2 x per week at home, keep log, and bring to clinic at next appointment. 5. Follow up 3 months.

## 2020-02-11 NOTE — Telephone Encounter (Signed)
She has an OV today (9/2) we can review then

## 2020-02-11 NOTE — Assessment & Plan Note (Signed)
Requesting to start on Celebrex at direction of her orthopedic provider.  Plan: 1. Can begin celebrex 100mg  BID for chronic pain

## 2020-02-11 NOTE — Assessment & Plan Note (Signed)
Reports stable and well controlled with meclizine 12.5mg  TID PRN.  Requesting refill.  Refill sent to pharmacy on file.

## 2020-02-11 NOTE — Assessment & Plan Note (Signed)
Pt postmenopausal w/o history of prior DEXA scan.    Plan: 1. Obtain DG bone density.

## 2020-02-11 NOTE — Patient Instructions (Signed)
I have sent in your prescription for Lisinopril-HCTZ 20-25mg  to take 1 tablet daily.    Try to get exercise a minimum of 30 minutes per day at least 5 days per week as well as  adequate water intake all while measuring blood pressure a few times per week.  Keep a blood pressure log and bring back to clinic at your next visit.  If your readings are consistently over 130/80 to contact our office/send me a MyChart message and we will see you sooner.  Can try DASH and Mediterranean diet options, avoiding processed foods, lowering sodium intake, avoiding pork products, and eating a plant based diet for optimal health.  I have sent in a prescription for celebrex to take as directed  Continue all medications as prescribed  We will plan to see you back in 3 months for hypertension follow up visit and to contact us within the next 2 weeks if you are not seeing improvement in your blood pressure  You will receive a survey after today's visit either digitally by e-mail or paper by [place] mail. Your experiences and feedback matter to [place].  Please respond so we know how we are doing as we provide care for you.  Call us with any questions/concerns/needs.  It is my goal to be available to you for your health concerns.  Thanks for choosing me to be a partner in your healthcare needs!  Harlin Rain, FNP-C Family Nurse Practitioner [facility] Phone: [phone]

## 2020-02-12 DIAGNOSIS — H532 Diplopia: Secondary | ICD-10-CM | POA: Diagnosis not present

## 2020-02-19 ENCOUNTER — Ambulatory Visit
Admission: RE | Admit: 2020-02-19 | Discharge: 2020-02-19 | Disposition: A | Discharge status: Home / Self Care | Payer: PPO | Source: Ambulatory Visit | Attending: Orthopedic Surgery | Admitting: Orthopedic Surgery

## 2020-02-19 ENCOUNTER — Other Ambulatory Visit: Payer: Self-pay

## 2020-02-19 ENCOUNTER — Other Ambulatory Visit: Payer: Self-pay | Admitting: Orthopedic Surgery

## 2020-02-19 ENCOUNTER — Ambulatory Visit: Payer: Self-pay

## 2020-02-19 DIAGNOSIS — M79604 Pain in right leg: Secondary | ICD-10-CM

## 2020-02-19 DIAGNOSIS — M7989 Other specified soft tissue disorders: Secondary | ICD-10-CM | POA: Diagnosis not present

## 2020-02-19 DIAGNOSIS — I82411 Acute embolism and thrombosis of right femoral vein: Secondary | ICD-10-CM | POA: Diagnosis not present

## 2020-02-19 DIAGNOSIS — I82890 Acute embolism and thrombosis of other specified veins: Secondary | ICD-10-CM | POA: Diagnosis not present

## 2020-02-19 DIAGNOSIS — I82401 Acute embolism and thrombosis of unspecified deep veins of right lower extremity: Secondary | ICD-10-CM | POA: Diagnosis not present

## 2020-02-19 NOTE — Telephone Encounter (Addendum)
   MW   Linda Farley. Linda Farley Female, 69 y.o., 1950/10/23 MRN:  [id number] Phone:  [phone] (H) ... PCP:  [name], FNP Coverage:  [employer]po Next Appt With Internal Medicine 04/26/2020 at 1:20 PM Message from Scherrie Gerlach sent at 02/19/2020 7:14 AM EDT  pt thinks she has another blood clot in her leg. Pt states it hurts really bad and has lumps in it. Pt states she just got over one that is why she feels this is another. No appts available. Please advise    Call History   Type Contact Phone  02/19/2020 07:12 AM EDT Phone (Incoming) [name], Mini (Self) [phone] Jerilynn Mages)  User: Scherrie Gerlach   Pt. Reports she started having pain in right calf 3 days ago. Reports she had a blood clot in this leg while in the hospital for hip surgery.Was on Lovenox at home and finished the treatment 02/02/20. "I can feel lumps on my calf." Mild swelling. Pain 6/10 when walking. No availability in the office.Office currently closed. Pt. Wants to know if she can be worked in. " I don't want to go to UC/ED." Please advise pt.Instructed to go to ED for worsening of symptoms. Answer Assessment - Initial Assessment Questions 1. ONSET: "When did the pain start?"      3 days ago 2. LOCATION: "Where is the pain located?"      Right calf 3. PAIN: "How bad is the pain?"    (Scale 1-10; or mild, moderate, severe)   -  MILD (1-3): doesn't interfere with normal activities    -  MODERATE (4-7): interferes with normal activities (e.g., work or school) or awakens from sleep, limping    -  SEVERE (8-10): excruciating pain, unable to do any normal activities, unable to walk     6 when walking 4. WORK OR EXERCISE: "Has there been any recent work or exercise that involved this part of the body?"      No 5. CAUSE: "What do you think is causing the leg pain?"     Blood clot 6. OTHER SYMPTOMS: "Do you have any other symptoms?" (e.g., chest pain, back pain, breathing  difficulty, swelling, rash, fever, numbness, weakness)     No 7. PREGNANCY: "Is there any chance you are pregnant?" "When was your last menstrual period?"     No  Protocols used: LEG PAIN-A-AH

## 2020-02-19 NOTE — Telephone Encounter (Signed)
Pt states Dr Collene Mares who did her first surgery for same thing is going to see her this afternoon at 3:45.  ----- Message from Scherrie Gerlach sent at 02/19/2020 7:14 AM EDT -----  pt thinks she has another blood clot in her leg. Pt states it hurts really bad and has lumps in it. Pt states she just got over one that is why she feels this is another. No appts available. Please advise   Call to patient- she reports she has just notified the office that Dr Collene Mares is going to see her today. Patient advised- please call back for any needs.

## 2020-02-25 DIAGNOSIS — R0683 Snoring: Secondary | ICD-10-CM | POA: Diagnosis not present

## 2020-02-25 DIAGNOSIS — Z8673 Personal history of transient ischemic attack (TIA), and cerebral infarction without residual deficits: Secondary | ICD-10-CM | POA: Diagnosis not present

## 2020-03-14 DIAGNOSIS — Z96641 Presence of right artificial hip joint: Secondary | ICD-10-CM | POA: Diagnosis not present

## 2020-03-14 DIAGNOSIS — M1611 Unilateral primary osteoarthritis, right hip: Secondary | ICD-10-CM | POA: Diagnosis not present

## 2020-04-07 ENCOUNTER — Other Ambulatory Visit: Payer: Self-pay

## 2020-04-07 ENCOUNTER — Ambulatory Visit
Admission: RE | Admit: 2020-04-07 | Discharge: 2020-04-07 | Disposition: A | Discharge status: Home / Self Care | Payer: PPO | Source: Ambulatory Visit | Attending: Family Medicine | Admitting: Family Medicine

## 2020-04-07 DIAGNOSIS — Z13820 Encounter for screening for osteoporosis: Secondary | ICD-10-CM | POA: Diagnosis not present

## 2020-04-07 DIAGNOSIS — M85852 Other specified disorders of bone density and structure, left thigh: Secondary | ICD-10-CM | POA: Diagnosis not present

## 2020-04-07 DIAGNOSIS — Z780 Asymptomatic menopausal state: Secondary | ICD-10-CM | POA: Diagnosis not present

## 2020-04-08 ENCOUNTER — Telehealth: Payer: Self-pay

## 2020-04-08 NOTE — Telephone Encounter (Signed)
Copied from [place] [id number]. Topic: General - Other >> Apr 08, 2020  9:25 AM Rainey Pines A wrote: Patient requesting a callback to go over bone density results. Please advise   Left a detail message on the patient personal vm.

## 2020-04-08 NOTE — Telephone Encounter (Signed)
Pt is calling back and would like bone density results before the weekend

## 2020-04-12 NOTE — Telephone Encounter (Signed)
DEXA results show osteopenia, would encourage her to increase her calcium (1200mg  daily) and vitamin D intake (can take an over the counter supplement) and increase her walking to 30 minutes daily.  Can plan to repeat the DEXA in 2-3 years.

## 2020-04-14 ENCOUNTER — Telehealth: Payer: Self-pay

## 2020-04-14 NOTE — Telephone Encounter (Signed)
I left a message on the patient home vm on yesterday.Elmyra Ricks released the results to her mychart. I just called the patient again and left a detail message on the patient personal cellphone vm.

## 2020-04-14 NOTE — Telephone Encounter (Signed)
Copied from [place] [phone] [phone]. Topic: General - Other >> Apr 13, 2020  1:27 PM Rainey Pines A wrote: Patient stated that she has been waiting on callback for bone density results  and would like a callback today and results mailed to her . Please advise

## 2020-04-26 ENCOUNTER — Other Ambulatory Visit: Payer: Self-pay

## 2020-04-26 ENCOUNTER — Ambulatory Visit (INDEPENDENT_AMBULATORY_CARE_PROVIDER_SITE_OTHER): Payer: PPO | Admitting: Family Medicine

## 2020-04-26 ENCOUNTER — Encounter: Payer: Self-pay | Admitting: Family Medicine

## 2020-04-26 VITALS — BP 95/50 | HR 53 | Temp 99.0°F | Resp 18 | Ht 65.0 in | Wt 205.0 lb

## 2020-04-26 DIAGNOSIS — R42 Dizziness and giddiness: Secondary | ICD-10-CM

## 2020-04-26 DIAGNOSIS — I10 Essential (primary) hypertension: Secondary | ICD-10-CM | POA: Diagnosis not present

## 2020-04-26 MED ORDER — MECLIZINE HCL 12.5 MG PO TABS: 12.5000 mg | ORAL_TABLET | Freq: Three times a day (TID) | ORAL | 1 refills | Status: DC

## 2020-04-26 NOTE — Assessment & Plan Note (Signed)
Controlled hypertension.  BP is at goal < 130/80.  Pt is not working on lifestyle modifications.  Taking medications tolerating well without side effects.  Discussed possibly be being overmedicated with BP in clinic 95/50.  Patient reports feeling well and declines medication decrease at this time. Complications:  Obesity, prediabetes, HLD  Plan: 1. Continue taking lisinopril-HCTZ 20-25mg  daily 2. Obtain labs in the next 1-2 wees  3. Encouraged heart healthy diet and increasing exercise to 30 minutes most days of the week, going no more than 2 days in a row without exercise. 4. Check BP 1-2 x per week at home, keep log, and bring to clinic at next appointment. 5. Follow up 6 months.

## 2020-04-26 NOTE — Patient Instructions (Signed)
Continue all medications as prescribed.  Try to get exercise a minimum of 30 minutes per day at least 5 days per week as well as  adequate water intake all while measuring blood pressure a few times per week.  Keep a blood pressure log and bring back to clinic at your next visit.  If your readings are consistently over 130/80 to contact our office/send me a MyChart message and we will see you sooner.  Can try DASH and Mediterranean diet options, avoiding processed foods, lowering sodium intake, avoiding pork products, and eating a plant based diet for optimal health.  We will plan to see you back in 6 months for hypertension follow up visit  You will receive a survey after today's visit either digitally by e-mail or paper by [place] mail. Your experiences and feedback matter to [place].  Please respond so we know how we are doing as we provide care for you.  Call us with any questions/concerns/needs.  It is my goal to be available to you for your health concerns.  Thanks for choosing me to be a partner in your healthcare needs!  Harlin Rain, FNP-C Family Nurse Practitioner [facility] Phone: [phone]

## 2020-04-26 NOTE — Assessment & Plan Note (Signed)
Stable and well controlled with meclizine 12.5mg  TID PRN.  Requesting refills.  Will send refill in to pharmacy on file.

## 2020-04-26 NOTE — Progress Notes (Signed)
Subjective:    Patient ID: Linda Farley, female    DOB: 08/23/1950, 69 y.o.   MRN: [id number]  Linda Farley is a 69 y.o. female presenting on 04/26/2020 for Hypertension and Medication Refill   HPI  Hypertension - She is not checking BP at home or outside of clinic.    - Current medications: lisinopril-HCTZ 20-25mg  daily, tolerating well without side effects - She is not currently symptomatic. - Pt denies headache, lightheadedness, dizziness, changes in vision, chest tightness/pressure, palpitations, leg swelling, sudden loss of speech or loss of consciousness. - She  reports no regular exercise routine. - Her diet is high in salt, high in fat, and high in carbohydrates.  Depression screen [facility] 2/9 08/04/2019 03/09/2019  Decreased Interest 1 1  Down, Depressed, Hopeless 0 1  PHQ - 2 Score 1 2  Altered sleeping 3 1  Tired, decreased energy 3 1  Change in appetite 0 0  Feeling bad or failure about yourself  0 0  Trouble concentrating 1 0  Moving slowly or fidgety/restless 0 0  Suicidal thoughts 0 0  PHQ-9 Score 8 4  Difficult doing work/chores Somewhat difficult Somewhat difficult    Social History   Tobacco Use  . Smoking status: Never Smoker  . Smokeless tobacco: Never Used  Vaping Use  . Vaping Use: Never used  Substance Use Topics  . Alcohol use: Yes    Alcohol/week: 1.0 standard drink    Types: 1 Glasses of wine per week    Comment: 3 wine coolers weekly  . Drug use: Never    Review of Systems  Constitutional: Negative.   HENT: Negative.   Eyes: Negative.   Respiratory: Negative.   Cardiovascular: Negative.   Gastrointestinal: Negative.   Endocrine: Negative.   Genitourinary: Negative.   Musculoskeletal: Negative.   Skin: Negative.   Allergic/Immunologic: Negative.   Neurological: Positive for dizziness. Negative for tremors, seizures, syncope, facial asymmetry, speech difficulty, weakness, light-headedness, numbness and headaches.  Hematological:  Negative.   Psychiatric/Behavioral: Negative.    Per HPI unless specifically indicated above     Objective:    BP (!) 95/50 (BP Location: Right Arm, Patient Position: Sitting, Cuff Size: Large)   Pulse (!) 53   Temp 99 F (37.2 C) (Oral)   Resp 18   Ht 5\' 5"  (1.651 m)   Wt 205 lb (93 kg)   SpO2 97%   BMI 34.11 kg/m   Wt Readings from Last 3 Encounters:  04/26/20 205 lb (93 kg)  02/11/20 206 lb 3.2 oz (93.5 kg)  01/14/20 194 lb 12.8 oz (88.4 kg)    Physical Exam Vitals and nursing note reviewed.  Constitutional:      General: She is not in acute distress.    Appearance: Normal appearance. She is well-developed and well-groomed. She is obese. She is not ill-appearing or toxic-appearing.  HENT:     Head: Normocephalic and atraumatic.     Nose:     Comments: Linda Farley is in place, covering mouth and nose. Eyes:     General: Lids are normal. Vision grossly intact.        Right eye: No discharge.        Left eye: No discharge.     Extraocular Movements: Extraocular movements intact.     Conjunctiva/sclera: Conjunctivae normal.     Pupils: Pupils are equal, round, and reactive to light.  Cardiovascular:     Rate and Rhythm: Normal rate and regular rhythm.  Pulses: Normal pulses.     Heart sounds: Normal heart sounds. No murmur heard.  No friction rub. No gallop.   Pulmonary:     Effort: Pulmonary effort is normal. No respiratory distress.     Breath sounds: Normal breath sounds.  Skin:    General: Skin is warm and dry.     Capillary Refill: Capillary refill takes less than 2 seconds.  Neurological:     General: No focal deficit present.     Mental Status: She is alert and oriented to person, place, and time.  Psychiatric:        Attention and Perception: Attention and perception normal.        Mood and Affect: Mood and affect normal.        Speech: Speech normal.        Behavior: Behavior normal. Behavior is cooperative.        Thought Content: Thought content  normal.        Cognition and Memory: Cognition and memory normal.        Judgment: Judgment normal.    Results for orders placed or performed in visit on 02/11/20  POCT glycosylated hemoglobin (Hb A1C)  Result Value Ref Range   Hemoglobin A1C 5.3 4.0 - 5.6 %   HbA1c POC (<> result, manual entry)     HbA1c, POC (prediabetic range)     HbA1c, POC (controlled diabetic range)        Assessment & Plan:   Problem List Items Addressed This Visit      Cardiovascular and Mediastinum   Essential hypertension    Controlled hypertension.  BP is at goal < 130/80.  Pt is not working on lifestyle modifications.  Taking medications tolerating well without side effects.  Discussed possibly be being overmedicated with BP in clinic 95/50.  Patient reports feeling well and declines medication decrease at this time. Complications:  Obesity, prediabetes, HLD  Plan: 1. Continue taking lisinopril-HCTZ 20-25mg  daily 2. Obtain labs in the next 1-2 wees  3. Encouraged heart healthy diet and increasing exercise to 30 minutes most days of the week, going no more than 2 days in a row without exercise. 4. Check BP 1-2 x per week at home, keep log, and bring to clinic at next appointment. 5. Follow up 6 months.       Relevant Medications   ELIQUIS 5 MG TABS tablet     Other   Vertigo - Primary    Stable and well controlled with meclizine 12.5mg  TID PRN.  Requesting refills.  Will send refill in to pharmacy on file.      Relevant Medications   meclizine (ANTIVERT) 12.5 MG tablet      Meds ordered this encounter  Medications  . meclizine (ANTIVERT) 12.5 MG tablet    Sig: Take 1 tablet (12.5 mg total) by mouth 3 (three) times daily.    Dispense:  90 tablet    Refill:  1    Follow up plan: Return in about 6 months (around 10/24/2020) for HTN F/U.   Linda Farley, [facility] Nurse Practitioner [facility] 04/26/2020, 2:50 PM

## 2020-04-27 DIAGNOSIS — M48062 Spinal stenosis, lumbar region with neurogenic claudication: Secondary | ICD-10-CM | POA: Diagnosis not present

## 2020-04-27 DIAGNOSIS — M5416 Radiculopathy, lumbar region: Secondary | ICD-10-CM | POA: Diagnosis not present

## 2020-04-27 DIAGNOSIS — M7551 Bursitis of right shoulder: Secondary | ICD-10-CM | POA: Diagnosis not present

## 2020-04-27 DIAGNOSIS — M7552 Bursitis of left shoulder: Secondary | ICD-10-CM | POA: Diagnosis not present

## 2020-04-27 DIAGNOSIS — M5136 Other intervertebral disc degeneration, lumbar region: Secondary | ICD-10-CM | POA: Diagnosis not present

## 2020-05-11 ENCOUNTER — Other Ambulatory Visit: Payer: Self-pay | Admitting: Family Medicine

## 2020-05-11 DIAGNOSIS — F329 Major depressive disorder, single episode, unspecified: Secondary | ICD-10-CM

## 2020-06-13 ENCOUNTER — Other Ambulatory Visit: Payer: Self-pay | Admitting: Family Medicine

## 2020-06-13 DIAGNOSIS — J45998 Other asthma: Secondary | ICD-10-CM

## 2020-07-25 ENCOUNTER — Other Ambulatory Visit: Payer: Self-pay | Admitting: Family Medicine

## 2020-07-25 MED ORDER — ELIQUIS 5 MG PO TABS
5.0000 mg | ORAL_TABLET | Freq: Two times a day (BID) | ORAL | 0 refills | Status: DC
Start: 2020-07-25 — End: 2020-08-25

## 2020-07-25 NOTE — Telephone Encounter (Signed)
   Notes to clinic:  would like PCP to prescribe a 1 month supply until she can get into the office. Patient would like request expedited Cyndia Skeeters is no longer at the practice  Review for refill    Requested Prescriptions  Pending Prescriptions Disp Refills   ELIQUIS 5 MG TABS tablet 60 tablet 0    Sig: Take 1 tablet (5 mg total) by mouth 2 (two) times daily.      Hematology:  Anticoagulants Failed - 07/25/2020  3:40 PM      Failed - HGB in normal range and within 360 days    Hemoglobin  Date Value Ref Range Status  12/23/2019 9.8 (L) 12.0 - 15.0 g/dL Final          Failed - HCT in normal range and within 360 days    HCT  Date Value Ref Range Status  12/23/2019 29.9 (L) 36.0 - 46.0 % Final          Passed - PLT in normal range and within 360 days    Platelets  Date Value Ref Range Status  12/23/2019 245 150 - 400 K/uL Final          Passed - Cr in normal range and within 360 days    Creat  Date Value Ref Range Status  09/03/2019 1.01 (H) 0.50 - 0.99 mg/dL Final    Comment:    For patients >55 years of age, the reference limit for Creatinine is approximately 13% higher for people identified as African-American. .    Creatinine, Ser  Date Value Ref Range Status  12/23/2019 0.83 0.44 - 1.00 mg/dL Final          Passed - Valid encounter within last 12 months    Recent Outpatient Visits           3 months ago [facility] M[place], Lupita Raider, FNP   5 months ago Essential hypertension   [facility], Lupita Raider, FNP   6 months ago Essential hypertension   [facility], Lupita Raider, FNP   11 months ago Essential hypertension   [facility], Lupita Raider, [place]   1 year ago Reactive depression   [facility] Merrilyn Puma, Jerrel Ivory, NP       Future Appointments             In 3 months [place], Lupita Raider, [facility], [facility]

## 2020-07-25 NOTE — Telephone Encounter (Signed)
Patient requesting ELIQUIS 5 MG TABS tablet and would like PCP to prescribe a 1 month supply until she can get into the office. Patient would like request expedited.    [place] ([place]), [place] - [address] Phone:  [phone]  Fax:  [phone]

## 2020-08-01 DIAGNOSIS — M1611 Unilateral primary osteoarthritis, right hip: Secondary | ICD-10-CM | POA: Diagnosis not present

## 2020-08-01 DIAGNOSIS — Z96641 Presence of right artificial hip joint: Secondary | ICD-10-CM | POA: Diagnosis not present

## 2020-08-02 DIAGNOSIS — M5136 Other intervertebral disc degeneration, lumbar region: Secondary | ICD-10-CM | POA: Diagnosis not present

## 2020-08-02 DIAGNOSIS — M5416 Radiculopathy, lumbar region: Secondary | ICD-10-CM | POA: Diagnosis not present

## 2020-08-02 DIAGNOSIS — M7552 Bursitis of left shoulder: Secondary | ICD-10-CM | POA: Diagnosis not present

## 2020-08-02 DIAGNOSIS — M7551 Bursitis of right shoulder: Secondary | ICD-10-CM | POA: Diagnosis not present

## 2020-08-02 DIAGNOSIS — M48062 Spinal stenosis, lumbar region with neurogenic claudication: Secondary | ICD-10-CM | POA: Diagnosis not present

## 2020-08-02 DIAGNOSIS — M1611 Unilateral primary osteoarthritis, right hip: Secondary | ICD-10-CM | POA: Diagnosis not present

## 2020-08-25 ENCOUNTER — Other Ambulatory Visit: Payer: Self-pay | Admitting: Family Medicine

## 2020-08-25 NOTE — Telephone Encounter (Signed)
Requested Prescriptions  Pending Prescriptions Disp Refills  . ELIQUIS 5 MG TABS tablet [Pharmacy Med Name: Eliquis 5 MG Oral Tablet] 90 tablet 0    Sig: Take 1 tablet by mouth twice daily     Hematology:  Anticoagulants Failed - 08/25/2020  5:31 AM      Failed - HGB in normal range and within 360 days    Hemoglobin  Date Value Ref Range Status  12/23/2019 9.8 (L) 12.0 - 15.0 g/dL Final         Failed - HCT in normal range and within 360 days    HCT  Date Value Ref Range Status  12/23/2019 29.9 (L) 36.0 - 46.0 % Final         Passed - PLT in normal range and within 360 days    Platelets  Date Value Ref Range Status  12/23/2019 245 150 - 400 K/uL Final         Passed - Cr in normal range and within 360 days    Creat  Date Value Ref Range Status  09/03/2019 1.01 (H) 0.50 - 0.99 mg/dL Final    Comment:    For patients >95 years of age, the reference limit for Creatinine is approximately 13% higher for people identified as African-American. .    Creatinine, Ser  Date Value Ref Range Status  12/23/2019 0.83 0.44 - 1.00 mg/dL Final         Passed - Valid encounter within last 12 months    Recent Outpatient Visits          4 months ago [facility] M[facility]i, Lupita Raider, FNP   6 months ago Essential hypertension   [facility], Lupita Raider, FNP   7 months ago Essential hypertension   [place], [place]   1 year ago Essential hypertension   [facility], Lupita Raider, [place]   1 year ago Reactive depression   [facility] Merrilyn Puma, Jerrel Ivory, NP      Future Appointments            In 2 months Malfi, Lupita Raider, [facility], [facility]

## 2020-10-25 ENCOUNTER — Ambulatory Visit (INDEPENDENT_AMBULATORY_CARE_PROVIDER_SITE_OTHER): Payer: PPO | Admitting: Internal Medicine

## 2020-10-25 ENCOUNTER — Encounter: Payer: Self-pay | Admitting: Internal Medicine

## 2020-10-25 ENCOUNTER — Other Ambulatory Visit: Payer: Self-pay

## 2020-10-25 VITALS — BP 131/81 | HR 75 | Temp 97.1°F | Resp 18 | Ht 65.0 in | Wt 203.2 lb

## 2020-10-25 DIAGNOSIS — Z8673 Personal history of transient ischemic attack (TIA), and cerebral infarction without residual deficits: Secondary | ICD-10-CM

## 2020-10-25 DIAGNOSIS — I82561 Chronic embolism and thrombosis of right calf muscular vein: Secondary | ICD-10-CM | POA: Diagnosis not present

## 2020-10-25 DIAGNOSIS — R7303 Prediabetes: Secondary | ICD-10-CM | POA: Diagnosis not present

## 2020-10-25 DIAGNOSIS — G2581 Restless legs syndrome: Secondary | ICD-10-CM

## 2020-10-25 DIAGNOSIS — B372 Candidiasis of skin and nail: Secondary | ICD-10-CM

## 2020-10-25 DIAGNOSIS — J45998 Other asthma: Secondary | ICD-10-CM | POA: Diagnosis not present

## 2020-10-25 DIAGNOSIS — M150 Primary generalized (osteo)arthritis: Secondary | ICD-10-CM

## 2020-10-25 DIAGNOSIS — M8949 Other hypertrophic osteoarthropathy, multiple sites: Secondary | ICD-10-CM

## 2020-10-25 DIAGNOSIS — E7800 Pure hypercholesterolemia, unspecified: Secondary | ICD-10-CM

## 2020-10-25 DIAGNOSIS — I10 Essential (primary) hypertension: Secondary | ICD-10-CM

## 2020-10-25 DIAGNOSIS — M159 Polyosteoarthritis, unspecified: Secondary | ICD-10-CM

## 2020-10-25 DIAGNOSIS — F329 Major depressive disorder, single episode, unspecified: Secondary | ICD-10-CM

## 2020-10-25 MED ORDER — FUROSEMIDE 20 MG PO TABS
20.0000 mg | ORAL_TABLET | Freq: Every day | ORAL | 0 refills | Status: DC
Start: 2020-10-25 — End: 2021-01-23

## 2020-10-25 MED ORDER — NYSTATIN 100000 UNIT/GM EX POWD: 1.0000 "application " | Freq: Three times a day (TID) | CUTANEOUS | 0 refills | Status: DC

## 2020-10-25 MED ORDER — ROPINIROLE HCL ER 2 MG PO TB24: 2.0000 mg | ORAL_TABLET | Freq: Every day | ORAL | 2 refills | Status: DC

## 2020-10-25 MED ORDER — LISINOPRIL 20 MG PO TABS: 20.0000 mg | ORAL_TABLET | Freq: Every day | ORAL | 0 refills | Status: DC

## 2020-10-25 MED ORDER — ASPIRIN EC 325 MG PO TBEC: 325.0000 mg | DELAYED_RELEASE_TABLET | Freq: Every day | ORAL | 0 refills | Status: DC

## 2020-10-25 NOTE — Progress Notes (Signed)
Subjective:    Patient ID: Linda Farley, female    DOB: 03/15/1951, 70 y.o.   MRN: [id number]  HPI  Patient presents the clinic today for follow-up of chronic conditions.  She is establishing care with me today, transferring care from [facility], NP.  HTN: Her BP today is 131/81.  She is taking Lisinopril HCT as prescribed. She does report some lower extremity edema. ECG from 12/2019 reviewed.  HLD s/p Stroke: Her last LDL was 82, triglycerides 82, 12/2019.  She denies myalgias on Atorvastatin.  She tries to consume a low-fat diet.  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  She is taking a baby ASA daily.  She follows with vascular surgery.  Depression: Chronic, managed on Citalopram.  She is not currently seeing a therapist.  She denies anxiety, SI/HI.  OA: Mainly in her lower back, shoulders.  She takes Gabapentin and Hydrocodone as prescribed with good relief of symptoms. She follows with Dr. Sharlet Salina.  Prediabetes: Her last A1c was 5.3%, 02/2020.  She is not currently taking any oral diabetic medication.  She does not check her sugars.  Asthma: Seasonal.  Managed with Albuterol as needed.  There are no PFTs on file.  RLS: She is taking Gabapentin, which she does not feel like is effective. She is wondering if there is anything else she can take.  She reports rash under her right axilla. She noticed this about a month ago. It does burn at times. She has been using Triamcinolone cream with some relief of symptoms but reports the rash will just not go away.   Review of Systems  Past Medical History:  Diagnosis Date  . Allergy   . Anxiety   . Arthritis   . Depression   . Hypertension     Current Outpatient Medications  Medication Sig Dispense Refill  . albuterol (VENTOLIN HFA) 108 (90 Base) MCG/ACT inhaler INHALE 2 PUFFS BY MOUTH EVERY 6 HOURS AS NEEDED FOR WHEEZING OR  SHORTNESS  OF  BREATH 9 g 2  . atorvastatin (LIPITOR) 20 MG tablet Take 1 tablet (20 mg total) by  mouth daily. 90 tablet 1  . calcium carbonate (TUMS - DOSED IN MG ELEMENTAL CALCIUM) 500 MG chewable tablet Chew 1 tablet by mouth daily.    . Cholecalciferol (D3 SUPER STRENGTH) 50 MCG (2000 UT) CAPS Take by mouth.    . citalopram (CELEXA) 20 MG tablet Take 1 tablet by mouth once daily 90 tablet 1  . ELIQUIS 5 MG TABS tablet Take 1 tablet by mouth twice daily 90 tablet 0  . FLUZONE HIGH-DOSE QUADRIVALENT 0.7 ML SUSY     . gabapentin (NEURONTIN) 300 MG capsule Take 1 capsule (300 mg total) by mouth as directed. One capsule in the morning and two capsules every day at bedtime 270 capsule 1  . HYDROcodone-acetaminophen (NORCO) 7.5-325 MG tablet 1 po qday prn    . lisinopril-hydrochlorothiazide (ZESTORETIC) 20-25 MG tablet Take 1 tablet by mouth daily. 90 tablet 3  . Magnesium 500 MG TABS Take 1 tablet by mouth daily.     . meclizine (ANTIVERT) 12.5 MG tablet Take 1 tablet (12.5 mg total) by mouth 3 (three) times daily. 90 tablet 1  . traMADol (ULTRAM) 50 MG tablet Take 50 mg by mouth in the morning, at noon, and at bedtime.      No current facility-administered medications for this visit.    Allergies  Allergen Reactions  . Chlorhexidine   . Penicillins Rash  Family History  Problem Relation Age of Onset  . Prostate cancer Father   . Bone cancer Brother   . Heart disease Brother     Social History   Socioeconomic History  . Marital status: Married    Spouse name: Not on file  . Number of children: Not on file  . Years of education: Not on file  . Highest education level: Not on file  Occupational History  . Not on file  Tobacco Use  . Smoking status: Never Smoker  . Smokeless tobacco: Never Used  Vaping Use  . Vaping Use: Never used  Substance and Sexual Activity  . Alcohol use: Yes    Alcohol/week: 1.0 standard drink    Types: 1 Glasses of wine per week    Comment: 3 wine coolers weekly  . Drug use: Never  . Sexual activity: Not on file  Other Topics Concern  .  Not on file  Social History Narrative  . Not on file   Social Determinants of Health   Financial Resource Strain: Not on file  Food Insecurity: Not on file  Transportation Needs: Not on file  Physical Activity: Not on file  Stress: Not on file  Social Connections: Not on file  Intimate Partner Violence: Not on file     Constitutional: Denies fever, malaise, fatigue, headache or abrupt weight changes.  HEENT: Denies eye pain, eye redness, ear pain, ringing in the ears, wax buildup, runny nose, nasal congestion, bloody nose, or sore throat. Respiratory: Denies difficulty breathing, shortness of breath, cough or sputum production.   Cardiovascular: Pt reports swelling in legs. Denies chest pain, chest tightness, palpitations or swelling in the hands.  Gastrointestinal: Denies abdominal pain, bloating, constipation, diarrhea or blood in the stool.  GU: Denies urgency, frequency, pain with urination, burning sensation, blood in urine, odor or discharge. Musculoskeletal: Patient reports history of chronic shoulder and back pain, decrease in range of motion.  Denies difficulty with gait, muscle pain or joint swelling.  Skin: Pt reports rash of right axilla. Denies lesions or ulcercations.  Neurological: Denies dizziness, difficulty with memory, difficulty with speech or problems with balance and coordination.  Psych: Patient has a history of depression. denies anxiety, SI/HI.  No other specific complaints in a complete review of systems (except as listed in HPI above).     Objective:   Physical Exam   BP 131/81 (BP Location: Right Arm, Patient Position: Sitting, Cuff Size: Normal)   Pulse 75   Temp (!) 97.1 F (36.2 C) (Temporal)   Resp 18   Ht 5\' 5"  (1.651 m)   Wt 203 lb 3.2 oz (92.2 kg)   SpO2 100%   BMI 33.81 kg/m   Wt Readings from Last 3 Encounters:  04/26/20 205 lb (93 kg)  02/11/20 206 lb 3.2 oz (93.5 kg)  01/14/20 194 lb 12.8 oz (88.4 kg)    General: Appears her  stated age, obese, in NAD. Skin: Warm, dry and intact. Yeast rash noted of right axilla. HEENT: Head: normal shape and size; Eyes: sclera white and EOMs intact; Neck:  Neck supple, trachea midline. No masses, lumps or thyromegaly present.  Cardiovascular: Normal rate and rhythm. S1,S2 noted.  No murmur, rubs or gallops noted. Trace pitting BLE edema. No carotid bruits noted. Pulmonary/Chest: Normal effort and positive vesicular breath sounds. No respiratory distress. No wheezes, rales or ronchi noted.  Musculoskeletal: Decreased external rotation of bilateral shoulders. Normal internal rotation of bilateral shoulders. Bony tenderness noted over the lumbar  spine. Strength 4/5 BUE. Strength 5/5 BLE. No difficulty with gait.  Neurological: Alert and oriented.   Psychiatric: Mood and affect mildly flat. Behavior is normal. Judgment and thought content normal.    BMET    Component Value Date/Time   NA 141 12/23/2019 0508   K 4.5 12/23/2019 0508   CL 106 12/23/2019 0508   CO2 29 12/23/2019 0508   GLUCOSE 106 (H) 12/23/2019 0508   BUN 14 12/23/2019 0508   CREATININE 0.83 12/23/2019 0508   CREATININE 1.01 (H) 09/03/2019 0928   CALCIUM 8.2 (L) 12/23/2019 0508   GFRNONAA >60 12/23/2019 0508   GFRAA >60 12/23/2019 0508    Lipid Panel     Component Value Date/Time   CHOL 142 12/19/2019 0552   TRIG 82 12/19/2019 0552   HDL 44 12/19/2019 0552   CHOLHDL 3.2 12/19/2019 0552   VLDL 16 12/19/2019 0552   LDLCALC 82 12/19/2019 0552   LDLCALC 95 09/03/2019 0928    CBC    Component Value Date/Time   WBC 8.9 12/23/2019 0508   RBC 3.23 (L) 12/23/2019 0508   HGB 9.8 (L) 12/23/2019 0508   HCT 29.9 (L) 12/23/2019 0508   PLT 245 12/23/2019 0508   MCV 92.6 12/23/2019 0508   MCH 30.3 12/23/2019 0508   MCHC 32.8 12/23/2019 0508   RDW 13.9 12/23/2019 0508   LYMPHSABS 2,651 09/03/2019 0928   EOSABS 74 09/03/2019 0928   BASOSABS 74 09/03/2019 0928    Hgb A1C Lab Results  Component Value Date    HGBA1C 5.3 02/11/2020           Assessment & Plan:   Yeast Infection, Right Axilla:  Stop Triamcinolone cream RX for Nystatin Powder 3 x day Try to keep this area as dry as possible  RTC in 6 months for you Medicare Wellness Exam Webb Silversmith, NP This visit occurred during the SARS-CoV-2 public health emergency.  Safety protocols were in place, including screening questions prior to the visit, additional usage of staff PPE, and extensive cleaning of exam room while observing appropriate contact time as indicated for disinfecting solutions.

## 2020-10-26 DIAGNOSIS — Z8673 Personal history of transient ischemic attack (TIA), and cerebral infarction without residual deficits: Secondary | ICD-10-CM | POA: Insufficient documentation

## 2020-10-26 DIAGNOSIS — M1990 Unspecified osteoarthritis, unspecified site: Secondary | ICD-10-CM | POA: Insufficient documentation

## 2020-10-26 DIAGNOSIS — G2581 Restless legs syndrome: Secondary | ICD-10-CM | POA: Insufficient documentation

## 2020-10-26 NOTE — Assessment & Plan Note (Signed)
She will schedule lab only appt for A1C Encouraged her to consume a low carb diet and exercise for weight loss.

## 2020-10-26 NOTE — Assessment & Plan Note (Signed)
Continue Citalopram, wean not indicated Support offered today

## 2020-10-26 NOTE — Patient Instructions (Signed)

## 2020-10-26 NOTE — Assessment & Plan Note (Signed)
Continue Gabapentin and Hydrocodone, prescribed by Dr. Sharlet Salina Encouraged regular physical activity

## 2020-10-26 NOTE — Assessment & Plan Note (Signed)
Unable to afford Eliquis Encouraged her to increase ASA to 325 mg daily Could refer to CCM services if agreeable

## 2020-10-26 NOTE — Assessment & Plan Note (Signed)
She will schedule lab visit for CMET and lipid profile. Continue Atorvastatin Encouraged her to consume a low fat diet

## 2020-10-26 NOTE — Assessment & Plan Note (Signed)
No residual effect Continue Atorvastatin, Lisinopril and ASA Will monitor She will schedule lab appt for CBC, CMET and Lipid profile

## 2020-10-26 NOTE — Assessment & Plan Note (Signed)
Will trial Requip 2 mg XR daily

## 2020-10-26 NOTE — Assessment & Plan Note (Signed)
Continue Albuterol as needed Will monitor

## 2020-10-26 NOTE — Assessment & Plan Note (Addendum)
Given edema, will d/c Lisinopril HCT RX for Lisinopril sent to pharmacy RX for Furosemide sent to pharmacy Encouraged DASH diet, exercise for weight loss Encouraged elevation and TED's, which she has but does not like to wear She will schedule lab visit for CMET

## 2020-10-27 ENCOUNTER — Telehealth: Payer: Self-pay

## 2020-10-27 NOTE — Telephone Encounter (Signed)
Copied from [place] [id number]. Topic: General - Other >> Oct 26, 2020  4:54 PM Loma [place] wrote: Reason for CRM: pt was just seen yesterday and forgot to mention that she wondering if since she no longer on blood thinners would she be a considered  to take Celebrex. If is not a stroke risk. She only wants if Dr thinks is ok, if it is... She would like it called in to [place] ([address]), [place] [address] [place] [address] [place] [address] Phone: [phone] Fax: [phone] Hours: Not open 24 hours

## 2020-10-28 NOTE — Telephone Encounter (Signed)
I would need her to get her labs first so that I can assess her kidney function, but with chronic DVT, HTN and DM, she is a stroke risk.

## 2020-11-01 ENCOUNTER — Other Ambulatory Visit: Payer: PPO

## 2020-11-01 ENCOUNTER — Other Ambulatory Visit: Payer: Self-pay

## 2020-11-01 DIAGNOSIS — R7303 Prediabetes: Secondary | ICD-10-CM

## 2020-11-01 DIAGNOSIS — E7800 Pure hypercholesterolemia, unspecified: Secondary | ICD-10-CM | POA: Diagnosis not present

## 2020-11-01 DIAGNOSIS — I10 Essential (primary) hypertension: Secondary | ICD-10-CM

## 2020-11-01 NOTE — Telephone Encounter (Signed)
The pt was notified of the provides recommendation. She said she will hold off from taking the celebrex.   She wanted to know how can she go about getting a DNR. Please advise

## 2020-11-01 NOTE — Telephone Encounter (Signed)
I can fill this out for her, will need to order some DNR forms

## 2020-11-02 ENCOUNTER — Other Ambulatory Visit: Payer: Self-pay

## 2020-11-02 DIAGNOSIS — F329 Major depressive disorder, single episode, unspecified: Secondary | ICD-10-CM

## 2020-11-02 LAB — HEMOGLOBIN A1C
Hgb A1c MFr Bld: 5.8 % of total Hgb — ABNORMAL HIGH (ref <5.7)
Mean Plasma Glucose: 120 mg/dL
eAG (mmol/L): 6.6 mmol/L

## 2020-11-02 LAB — LIPID PANEL
Cholesterol: 154 mg/dL (ref <200)
HDL: 55 mg/dL (ref >=50)
LDL Cholesterol (Calc): 78 mg/dL (calc)
Non-HDL Cholesterol (Calc): 99 mg/dL (calc) (ref <130)
Total CHOL/HDL Ratio: 2.8 (calc) (ref <5.0)
Triglycerides: 130 mg/dL (ref <150)

## 2020-11-02 LAB — CBC
HCT: 40.7 % (ref 35.0–45.0)
Hemoglobin: 13.5 g/dL (ref 11.7–15.5)
MCH: 30.6 pg (ref 27.0–33.0)
MCHC: 33.2 g/dL (ref 32.0–36.0)
MCV: 92.3 fL (ref 80.0–100.0)
MPV: 12.2 fL (ref 7.5–12.5)
Platelets: 191 10*3/uL (ref 140–400)
RBC: 4.41 10*6/uL (ref 3.80–5.10)
RDW: 13.3 % (ref 11.0–15.0)
WBC: 6.9 10*3/uL (ref 3.8–10.8)

## 2020-11-02 LAB — COMPREHENSIVE METABOLIC PANEL
AG Ratio: 1.9 (calc) (ref 1.0–2.5)
ALT: 10 U/L (ref 6–29)
AST: 15 U/L (ref 10–35)
Albumin: 3.9 g/dL (ref 3.6–5.1)
Alkaline phosphatase (APISO): 61 U/L (ref 37–153)
BUN/Creatinine Ratio: 23 (calc) — ABNORMAL HIGH (ref 6–22)
BUN: 24 mg/dL (ref 7–25)
CO2: 32 mmol/L (ref 20–32)
Calcium: 9.3 mg/dL (ref 8.6–10.4)
Chloride: 103 mmol/L (ref 98–110)
Creat: 1.06 mg/dL — ABNORMAL HIGH (ref 0.60–0.93)
Globulin: 2.1 g/dL (calc) (ref 1.9–3.7)
Glucose, Bld: 77 mg/dL (ref 65–99)
Potassium: 4.2 mmol/L (ref 3.5–5.3)
Sodium: 141 mmol/L (ref 135–146)
Total Bilirubin: 0.5 mg/dL (ref 0.2–1.2)
Total Protein: 6 g/dL — ABNORMAL LOW (ref 6.1–8.1)

## 2020-11-03 MED ORDER — CITALOPRAM HYDROBROMIDE 20 MG PO TABS: 20.0000 mg | ORAL_TABLET | Freq: Every day | ORAL | 1 refills | Status: DC

## 2020-11-08 ENCOUNTER — Telehealth: Payer: Self-pay | Admitting: Internal Medicine

## 2020-11-08 ENCOUNTER — Telehealth: Payer: Self-pay

## 2020-11-08 ENCOUNTER — Other Ambulatory Visit: Payer: Self-pay | Admitting: Internal Medicine

## 2020-11-08 NOTE — Telephone Encounter (Signed)
Requested medication (s) are due for refill today: yes  Requested medication (s) are on the active medication list: yes  Last refill:  10/25/20  Future visit scheduled: yes  Notes to clinic:  med not assigned to a protocol   Requested Prescriptions  Pending Prescriptions Disp Refills   NYSTATIN powder [Pharmacy Med Name: Nystop 100000 UNIT/GM External Powder] 30 g 0    Sig: APPLY TOPICALLY THREE TIMES DAILY      Off-Protocol Failed - 11/08/2020  9:05 AM      Failed - Medication not assigned to a protocol, review manually.      Passed - Valid encounter within last 12 months    Recent Outpatient Visits           2 weeks ago Essential hypertension   [place], Coralie Keens, NP   6 months ago [facility] Malfi, Lupita Raider, FNP   9 months ago Essential hypertension   [facility], Lupita Raider, FNP   9 months ago Essential hypertension   [place], [place]   1 year ago Essential hypertension   [facility], Lupita Raider, [place]       Future Appointments             In 6 months [place], Coralie Keens, NP [facility], [facility]

## 2020-11-08 NOTE — Telephone Encounter (Signed)
Patient inquiring about lab results

## 2020-11-08 NOTE — Telephone Encounter (Signed)
Copied from [place] [phone]. Topic: Quick Communication - Lab Results (Clinic Use ONLY) >> Nov 04, 2020 11:01 AM Lennox Solders wrote: Pt would like blood work results   Pt notified of lab results. No questions or concerns.

## 2020-11-08 NOTE — Telephone Encounter (Signed)
The pt was notified of her lab results. No questions or concerns.

## 2020-11-21 DIAGNOSIS — M1611 Unilateral primary osteoarthritis, right hip: Secondary | ICD-10-CM | POA: Diagnosis not present

## 2020-11-21 DIAGNOSIS — M48062 Spinal stenosis, lumbar region with neurogenic claudication: Secondary | ICD-10-CM | POA: Diagnosis not present

## 2020-11-21 DIAGNOSIS — M7551 Bursitis of right shoulder: Secondary | ICD-10-CM | POA: Diagnosis not present

## 2020-11-21 DIAGNOSIS — M5136 Other intervertebral disc degeneration, lumbar region: Secondary | ICD-10-CM | POA: Diagnosis not present

## 2020-11-21 DIAGNOSIS — M7552 Bursitis of left shoulder: Secondary | ICD-10-CM | POA: Diagnosis not present

## 2020-11-21 DIAGNOSIS — M5416 Radiculopathy, lumbar region: Secondary | ICD-10-CM | POA: Diagnosis not present

## 2020-12-07 ENCOUNTER — Encounter: Payer: Self-pay | Admitting: Family Medicine

## 2020-12-07 ENCOUNTER — Other Ambulatory Visit: Payer: Self-pay

## 2020-12-07 ENCOUNTER — Ambulatory Visit (INDEPENDENT_AMBULATORY_CARE_PROVIDER_SITE_OTHER): Payer: PPO | Admitting: Family Medicine

## 2020-12-07 VITALS — Ht 65.0 in | Wt 203.0 lb

## 2020-12-07 DIAGNOSIS — M109 Gout, unspecified: Secondary | ICD-10-CM

## 2020-12-07 MED ORDER — PREDNISONE 10 MG PO TABS: ORAL_TABLET | ORAL | 0 refills | Status: DC

## 2020-12-07 NOTE — Patient Instructions (Addendum)
Start prednisone taper Follow-up if unresolved May need blood work for Uric acid in future Consider switching Lisinopril to Losartan to reduce gout risk Also consider gout prevention medicines if persistent problem.   Please schedule a Follow-up Appointment to: Return if symptoms worsen or fail to improve, for gout.  If you have any other questions or concerns, please feel free to call the office or send a message through [place]. You may also schedule an earlier appointment if necessary.  Additionally, you may be receiving a survey about your experience at our office within a few days to 1 week by e-mail or mail. We value your feedback.  Nobie Putnam, DO [place]

## 2020-12-07 NOTE — Progress Notes (Signed)
Virtual Visit via Telephone The purpose of this virtual visit is to provide medical care while limiting exposure to the novel coronavirus (COVID19) for both patient and office staff.  Consent was obtained for phone visit:  Yes.   Answered questions that patient had about telehealth interaction:  Yes.   I discussed the limitations, risks, security and privacy concerns of performing an evaluation and management service by telephone. I also discussed with the patient that there may be a patient responsible charge related to this service. The patient expressed understanding and agreed to proceed.  Patient Location: Home Provider Location: [facility] (Office)  Participants in virtual visit: - Patient: Linda Farley - CMA: Orinda Kenner, CMA - Provider: Dr Parks Ranger  ---------------------------------------------------------------------- Chief Complaint  Patient presents with   Gout   Patient presents for a same day appointment. PCP Webb Silversmith, FNP   S: Reviewed CMA documentation. I have called patient and gathered additional HPI as follows:  Acute Gout, R Foot / Great Toe Reports that symptoms started 11 days ago, initially pain in R ankle and then moved to R foot with gout flare with most of symptoms in R Great Toe joint. Similar to prior. - She tries to avoid food triggers - She cannot take OTC NSAIDs due to history of Stroke. She has taken Prednisone in past for gout.  HTN Additionally she takes Lisinopril 20mg  daily admits some cough, has improved however Other update, her PCP has adjusted her BP medication from HCTZ to Furosemide on 10/25/20, and patient is asking if this could contribute to causing flare. She said HCTZ caused flare when she first started it years ago.   Denies any fevers, chills, sweats, body ache, cough, shortness of breath, sinus pain or pressure, headache, abdominal pain, diarrhea  Past Medical History:  Diagnosis Date   Allergy     Anxiety    Arthritis    Depression    Hypertension    Social History   Tobacco Use   Smoking status: Never   Smokeless tobacco: Never  Vaping Use   Vaping Use: Never used  Substance Use Topics   Alcohol use: Yes    Alcohol/week: 1.0 standard drink    Types: 1 Glasses of wine per week    Comment: 3 wine coolers weekly   Drug use: Never    Current Outpatient Medications:    albuterol (VENTOLIN HFA) 108 (90 Base) MCG/ACT inhaler, INHALE 2 PUFFS BY MOUTH EVERY 6 HOURS AS NEEDED FOR WHEEZING OR  SHORTNESS  OF  BREATH, Disp: 9 g, Rfl: 2   aspirin EC 325 MG tablet, Take 1 tablet (325 mg total) by mouth daily., Disp: 30 tablet, Rfl: 0   calcium carbonate (TUMS - DOSED IN MG ELEMENTAL CALCIUM) 500 MG chewable tablet, Chew 1 tablet by mouth daily., Disp: , Rfl:    Cholecalciferol (D3 SUPER STRENGTH) 50 MCG (2000 UT) CAPS, Take by mouth., Disp: , Rfl:    citalopram (CELEXA) 20 MG tablet, Take 1 tablet (20 mg total) by mouth daily., Disp: 90 tablet, Rfl: 1   furosemide (LASIX) 20 MG tablet, Take 1 tablet (20 mg total) by mouth daily., Disp: 90 tablet, Rfl: 0   lisinopril (ZESTRIL) 20 MG tablet, Take 1 tablet (20 mg total) by mouth daily., Disp: 90 tablet, Rfl: 0   meclizine (ANTIVERT) 12.5 MG tablet, Take 1 tablet (12.5 mg total) by mouth 3 (three) times daily., Disp: 90 tablet, Rfl: 1   NYSTATIN powder, APPLY TOPICALLY THREE  TIMES DAILY, Disp: 30 g, Rfl: 0   predniSONE (DELTASONE) 10 MG tablet, Take 4 tablets (40mg ) for 2 days, then 3 tab (30mg ) for 2 days, then 2 tab (20mg ) for 2 days, then 1 tab (10mg ) for 2 days., Disp: 20 tablet, Rfl: 0   rOPINIRole (REQUIP XL) 2 MG 24 hr tablet, Take 1 tablet (2 mg total) by mouth at bedtime., Disp: 30 tablet, Rfl: 2   atorvastatin (LIPITOR) 20 MG tablet, Take 1 tablet (20 mg total) by mouth daily., Disp: 90 tablet, Rfl: 1   gabapentin (NEURONTIN) 300 MG capsule, Take 1 capsule (300 mg total) by mouth as directed. One capsule in the morning and two  capsules every day at bedtime, Disp: 270 capsule, Rfl: 1   HYDROcodone-acetaminophen (NORCO) 7.5-325 MG tablet, 1 po qday prn (Patient not taking: Reported on 12/07/2020), Disp: , Rfl:   Depression screen [facility] 2/9 10/25/2020 08/04/2019 03/09/2019  Decreased Interest 2 1 1   Down, Depressed, Hopeless 1 0 1  PHQ - 2 Score 3 1 2   Altered sleeping 2 3 1   Tired, decreased energy 3 3 1   Change in appetite 2 0 0  Feeling bad or failure about yourself  2 0 0  Trouble concentrating 2 1 0  Moving slowly or fidgety/restless 1 0 0  Suicidal thoughts 0 0 0  PHQ-9 Score 15 8 4   Difficult doing work/chores Not difficult at all Somewhat difficult Somewhat difficult    GAD 7 : Generalized Anxiety Score 10/25/2020 03/09/2019  Nervous, Anxious, on Edge 1 0  Control/stop worrying 2 0  Worry too much - different things 2 0  Trouble relaxing 2 0  Restless 0 0  Easily annoyed or irritable 1 1  Afraid - awful might happen 2 0  Total GAD 7 Score 10 1  Anxiety Difficulty Not difficult at all Not difficult at all    -------------------------------------------------------------------------- O: No physical exam performed due to remote telephone encounter.  Lab results reviewed.  Recent Results (from the past 2160 hour(s))  Hemoglobin A1c     Status: Abnormal   Collection Time: 11/01/20  7:55 AM  Result Value Ref Range   Hgb A1c MFr Bld 5.8 (H) <5.7 % of total Hgb    Comment: For someone without known diabetes, a hemoglobin  A1c value between 5.7% and 6.4% is consistent with prediabetes and should be confirmed with a  follow-up test. . For someone with known diabetes, a value <7% indicates that their diabetes is well controlled. A1c targets should be individualized based on duration of diabetes, age, comorbid conditions, and other considerations. . This assay result is consistent with an increased risk of diabetes. . Currently, no consensus exists regarding use of hemoglobin A1c for diagnosis of  diabetes for children. .    Mean Plasma Glucose 120 mg/dL   eAG (mmol/L) 6.6 mmol/L  Lipid panel     Status: None   Collection Time: 11/01/20  7:55 AM  Result Value Ref Range   Cholesterol 154 <200 mg/dL   HDL 55 > OR = 50 mg/dL   Triglycerides 130 <150 mg/dL   LDL Cholesterol (Calc) 78 mg/dL (calc)    Comment: Reference range: <100 . Desirable range <100 mg/dL for primary prevention;   <70 mg/dL for patients with CHD or diabetic patients  with > or = 2 CHD risk factors. Marland Kitchen LDL-C is now calculated using the Martin-Hopkins  calculation, which is a validated novel method providing  better accuracy than the Friedewald equation in  the  estimation of LDL-C.  Cresenciano Genre et al. Annamaria Helling. 2130;865(78): 2061-2068  (http://education.[employer].com/faq/FAQ164)    Total CHOL/HDL Ratio 2.8 <5.0 (calc)   Non-HDL Cholesterol (Calc) 99 <130 mg/dL (calc)    Comment: For patients with diabetes plus 1 major ASCVD risk  factor, treating to a non-HDL-C goal of <100 mg/dL  (LDL-C of <70 mg/dL) is considered a therapeutic  option.   Comprehensive metabolic panel     Status: Abnormal   Collection Time: 11/01/20  7:55 AM  Result Value Ref Range   Glucose, Bld 77 65 - 99 mg/dL    Comment: .            Fasting reference interval .    BUN 24 7 - 25 mg/dL   Creat 1.06 (H) 0.60 - 0.93 mg/dL    Comment: For patients >73 years of age, the reference limit for Creatinine is approximately 13% higher for people identified as African-American. .    BUN/Creatinine Ratio 23 (H) 6 - 22 (calc)   Sodium 141 135 - 146 mmol/L   Potassium 4.2 3.5 - 5.3 mmol/L   Chloride 103 98 - 110 mmol/L   CO2 32 20 - 32 mmol/L   Calcium 9.3 8.6 - 10.4 mg/dL   Total Protein 6.0 (L) 6.1 - 8.1 g/dL   Albumin 3.9 3.6 - 5.1 g/dL   Globulin 2.1 1.9 - 3.7 g/dL (calc)   AG Ratio 1.9 1.0 - 2.5 (calc)   Total Bilirubin 0.5 0.2 - 1.2 mg/dL   Alkaline phosphatase (APISO) 61 37 - 153 U/L   AST 15 10 - 35 U/L   ALT 10 6 - 29 U/L   CBC     Status: None   Collection Time: 11/01/20  7:55 AM  Result Value Ref Range   WBC 6.9 3.8 - 10.8 Thousand/uL   RBC 4.41 3.80 - 5.10 Million/uL   Hemoglobin 13.5 11.7 - 15.5 g/dL   HCT 40.7 35.0 - 45.0 %   MCV 92.3 80.0 - 100.0 fL   MCH 30.6 27.0 - 33.0 pg   MCHC 33.2 32.0 - 36.0 g/dL   RDW 13.3 11.0 - 15.0 %   Platelets 191 140 - 400 Thousand/uL   MPV 12.2 7.5 - 12.5 fL    -------------------------------------------------------------------------- A&P:  Problem List Items Addressed This Visit   None Visit Diagnoses     Acute gout involving toe of right foot, unspecified cause    -  Primary   Relevant Medications   predniSONE (DELTASONE) 10 MG tablet      Clinically consistent with acute gout flare of Right great toe MTP, onset 11 days ago Without improvement. Known history of gout similar flare in past Possible trigger switch from HCTZ to Furosemide. However has had triggered by HCTZ in past. - Not on uric acid medicine or had lvl checked Cannot take NSAID due to history of CVA  Plan: Start Prednisone taper over 8 days Avoid excessive ambulation, relative rest, ice if helps, can take Tylenol PRN Avoid food triggers (red meat, alcohol)  Also recommended may switch Lisinopril 20mg  to Losartan 50mg  daily for HTN, and to avoid ACEi Cough that she seems to report and will reduce risk of gout flares. - Forward this consideration to PCP for review, may notify patient sooner if plans to pursue this or at next visit.    Meds ordered this encounter  Medications   predniSONE (DELTASONE) 10 MG tablet    Sig: Take 4 tablets (40mg ) for 2  days, then 3 tab (30mg ) for 2 days, then 2 tab (20mg ) for 2 days, then 1 tab (10mg ) for 2 days.    Dispense:  20 tablet    Refill:  0    Follow-up: - Return in 2-4 weeks as needed if gout returns or unresolved  Patient verbalizes understanding with the above medical recommendations including the limitation of remote medical  advice.  Specific follow-up and call-back criteria were given for patient to follow-up or seek medical care more urgently if needed.   - Time spent in direct consultation with patient on phone: 12 minutes   Nobie Putnam, [facility] 12/07/2020, 2:46 PM

## 2020-12-08 NOTE — Progress Notes (Signed)
Call pt, see if she is willing to do a virtual visit with me to discuss her diuretics and gout flares, and her ACEI induced cough, or see if she just wants to follow up on this at her next visit?

## 2020-12-14 ENCOUNTER — Telehealth: Payer: Self-pay | Admitting: Internal Medicine

## 2020-12-14 NOTE — Telephone Encounter (Signed)
Pt called stating that she is currently on a prednisone taper for gout in her leg. She states that it is not helping and that she is having returning pain. Please advise.      [place] ([address]), [place] [address] [place] [address] [place] [address]  Phone: [phone] Fax: [phone]  Hours: Not open 24 hours

## 2020-12-15 NOTE — Telephone Encounter (Signed)
If no better, recommend she follow up with me. If no spots available, we can work her in

## 2020-12-15 NOTE — Telephone Encounter (Signed)
Attempted to contact the patient several times and left a message on her voicemail.

## 2020-12-15 NOTE — Telephone Encounter (Signed)
Pt did not want to schedule an appt, please advise if the pt can still get the medication requested.

## 2020-12-16 NOTE — Telephone Encounter (Signed)
Attempted to contact the patient again, no answer.

## 2021-01-04 ENCOUNTER — Other Ambulatory Visit: Payer: Self-pay | Admitting: Internal Medicine

## 2021-01-13 ENCOUNTER — Other Ambulatory Visit: Payer: Self-pay

## 2021-01-13 DIAGNOSIS — J45998 Other asthma: Secondary | ICD-10-CM

## 2021-01-13 MED ORDER — ALBUTEROL SULFATE HFA 108 (90 BASE) MCG/ACT IN AERS: INHALATION_SPRAY | RESPIRATORY_TRACT | 3 refills | Status: DC

## 2021-01-21 ENCOUNTER — Other Ambulatory Visit: Payer: Self-pay | Admitting: Internal Medicine

## 2021-01-21 NOTE — Telephone Encounter (Signed)
Requested medication (s) are due for refill today: yes  Requested medication (s) are on the active medication list: yes  Last refill:  10/25/20 for both meds  Future visit scheduled: yes  Notes to clinic:  overdue labs   Requested Prescriptions  Pending Prescriptions Disp Refills   lisinopril (ZESTRIL) 20 MG tablet [Pharmacy Med Name: Lisinopril 20 MG Oral Tablet] 90 tablet 0    Sig: Take 1 tablet by mouth once daily     Cardiovascular:  ACE Inhibitors Failed - 01/21/2021 10:35 AM      Failed - Cr in normal range and within 180 days    Creat  Date Value Ref Range Status  11/01/2020 1.06 (H) 0.60 - 0.93 mg/dL Final    Comment:    For patients >77 years of age, the reference limit for Creatinine is approximately 13% higher for people identified as African-American. .           Passed - K in normal range and within 180 days    Potassium  Date Value Ref Range Status  11/01/2020 4.2 3.5 - 5.3 mmol/L Final          Passed - Patient is not pregnant      Passed - Last BP in normal range    BP Readings from Last 1 Encounters:  10/25/20 131/81          Passed - Valid encounter within last 6 months    Recent Outpatient Visits           1 month ago Acute gout involving toe of right foot, unspecified cause   [facility] [place], Devonne Doughty, DO   2 months ago Essential hypertension   [facility] [place], Coralie Keens, NP   9 months ago [facility] [place], Lupita Raider, FNP   11 months ago Essential hypertension   [place], [place]   1 year ago Essential hypertension   [place], FNP       Future Appointments             In 3 months Baity, Coralie Keens, NP [facility]             furosemide (LASIX) 20 MG tablet [Pharmacy Med Name: Furosemide 20 MG Oral Tablet] 90 tablet 0    Sig: Take 1 tablet by mouth once daily      Cardiovascular:  Diuretics - Loop Failed - 01/21/2021 10:35 AM      Failed - Cr in normal range and within 360 days    Creat  Date Value Ref Range Status  11/01/2020 1.06 (H) 0.60 - 0.93 mg/dL Final    Comment:    For patients >62 years of age, the reference limit for Creatinine is approximately 13% higher for people identified as African-American. .           Passed - K in normal range and within 360 days    Potassium  Date Value Ref Range Status  11/01/2020 4.2 3.5 - 5.3 mmol/L Final          Passed - Ca in normal range and within 360 days    Calcium  Date Value Ref Range Status  11/01/2020 9.3 8.6 - 10.4 mg/dL Final          Passed - Na in normal range and within 360 days    Sodium  Date  Value Ref Range Status  11/01/2020 141 135 - 146 mmol/L Final          Passed - Last BP in normal range    BP Readings from Last 1 Encounters:  10/25/20 131/81          Passed - Valid encounter within last 6 months    Recent Outpatient Visits           1 month ago Acute gout involving toe of right foot, unspecified cause   [facility] [place], Devonne Doughty, DO   2 months ago Essential hypertension   [facility] [place], Coralie Keens, NP   9 months ago [facility] [place], Lupita Raider, FNP   11 months ago Essential hypertension   [place], [place]   1 year ago Essential hypertension   [facility], Lupita Raider, FNP       Future Appointments             In 3 months Baity, Coralie Keens, NP [facility], [facility]

## 2021-02-17 ENCOUNTER — Other Ambulatory Visit: Payer: Self-pay | Admitting: Internal Medicine

## 2021-02-20 ENCOUNTER — Other Ambulatory Visit: Payer: Self-pay | Admitting: Internal Medicine

## 2021-02-20 NOTE — Telephone Encounter (Signed)
Requested medication (s) are due for refill today:  Provider to review  Requested medication (s) are on the active medication list:   Yes  Future visit scheduled:   Yes   Last ordered: 11/08/2020 #30 g, 0 refills  Returned because there isn't a protocol assigned to this med.   Requested Prescriptions  Pending Prescriptions Disp Refills   NYSTATIN powder [Pharmacy Med Name: Nystop 100000 UNIT/GM External Powder] 30 g 0    Sig: APPLY  POWDER TOPICALLY TO AFFECTED AREA THREE TIMES DAILY     Off-Protocol Failed - 02/20/2021  9:35 AM      Failed - Medication not assigned to a protocol, review manually.      Passed - Valid encounter within last 12 months    Recent Outpatient Visits           2 months ago Acute gout involving toe of right foot, unspecified cause   [place], DO   3 months ago Essential hypertension   [facility] [place], Coralie Keens, NP   10 months ago [facility] [place], Lupita Raider, FNP   1 year ago Essential hypertension   [place], [place]   1 year ago Essential hypertension   [place], FNP       Future Appointments             In 2 months Baity, Coralie Keens, NP [facility], [facility]

## 2021-03-17 ENCOUNTER — Ambulatory Visit: Payer: Self-pay | Admitting: *Deleted

## 2021-03-17 NOTE — Telephone Encounter (Signed)
Call to patient- I let her know that I was not able to get response from office- reviewed healthy sleep habits- warm shower, decreased screen time, avoiding caffeine, trying to relax the mind- finding peaceful place to meditate-clear mind of stresses. Patient states she has family member for support- advised her to reach out to them. Patient is ware she can call crisis center for help if needed- offered numbers of support systems in [place]- patient states she will reach out to family if she needs.

## 2021-03-17 NOTE — Telephone Encounter (Signed)
Reason for Disposition  [1] Depression AND [2] worsening (e.g.,sleeping poorly, less able to do activities of daily living)  Answer Assessment - Initial Assessment Questions 1. DESCRIPTION: "Tell me about your sleeping problem."      Patient always gets weepy without sleep- patient is fatigued- but is having trouble sleeping 2. ONSET: "How long have you been having trouble sleeping?" (e.g., days, weeks, months)     Time to time- had leg cramps that would keep her awake- but since she lost her husband sleep has been worse 3. RECURRENT: "Have you had sleeping problems before?"  If Yes, ask: "What happened that time?" "What helped your sleeping problem go away in the past?"      Yes- long time ago- she used something short term- had lost her mother 4. STRESS: "Is there anything in your life that is making you feel stressed or tense?"     Yes- loss of husband- 03/03/21 5. PAIN: "Do you have any pain that is keeping you awake?" (e.g., back pain, headache, abdominal pain)     Yes- shoulder pain- patient is under treatment 6. CAFFEINE ABUSE: "Do you drink caffeinated beverages, and how much each day?" (e.g., coffee, tea, colas)     Quit coffee- uses decaf 7. ALCOHOL USE OR SUBSTANCE USE (DRUG USE): "Do you drink alcohol or use any illegal drugs?"     Wine coolers- but not recently 8. OTHER SYMPTOMS: "Do you have any other symptoms?"  (e.g., difficulty breathing)     no  Answer Assessment - Initial Assessment Questions 1. CONCERN: "What happened that made you call today?"     *No Answer* 2. DEPRESSION SYMPTOM SCREENING: "How are you feeling overall?" (e.g., decreased energy, increased sleeping or difficulty sleeping, difficulty concentrating, feelings of sadness, guilt, hopelessness, or worthlessness)     *No Answer* 3. RISK OF HARM - SUICIDAL IDEATION:  "Do you ever have thoughts of hurting or killing yourself?"  (e.g., yes, no, no but preoccupation with thoughts about death)   - INTENT:  "Do you  have thoughts of hurting or killing yourself right NOW?" (e.g., yes, no, N/A)   - PLAN: "Do you have a specific plan for how you would do this?" (e.g., gun, knife, overdose, no plan, N/A)     no 4. RISK OF HARM - HOMICIDAL IDEATION:  "Do you ever have thoughts of hurting or killing someone else?"  (e.g., yes, no, no but preoccupation with thoughts about death)   - INTENT:  "Do you have thoughts of hurting or killing someone right NOW?" (e.g., yes, no, N/A)   - PLAN: "Do you have a specific plan for how you would do this?" (e.g., gun, knife, no plan, N/A)      *No Answer* 5. FUNCTIONAL IMPAIRMENT: "How have things been going for you overall? Have you had more difficulty than usual doing your normal daily activities?"  (e.g., better, same, worse; self-care, school, work, interactions)     *No Answer* 6. SUPPORT: "Who is with you now?" "Who do you live with?" "Do you have family or friends who you can talk to?"      *No Answer* 7. THERAPIST: "Do you have a counselor or therapist? Name?"     *No Answer* 8. STRESSORS: "Has there been any new stress or recent changes in your life?"     *No Answer* 9. ALCOHOL USE OR SUBSTANCE USE (DRUG USE): "Do you drink alcohol or use any illegal drugs?"     *No Answer* 10. OTHER: "Do  you have any other physical symptoms right now?" (e.g., fever)       *No Answer* 11. PREGNANCY: "Is there any chance you are pregnant?" "When was your last menstrual period?"       *No Answer*  Protocols used: Insomnia-A-AH, Depression-A-AH

## 2021-03-17 NOTE — Telephone Encounter (Signed)
Patient is calling to report she is having trouble sleeping at night- patient lost her husband of 62 years on 03/03/21 and she is overwhelmed with all the things that need to be done. Patient states she is on antidepressant and she is not suicidal. Patient states she lays down to sleep at night and just can't fall asleep. Patient advised it may be Monday before she can speak to provider about medication and she declines appointment to come into the office.

## 2021-03-22 NOTE — Telephone Encounter (Signed)
Pt grieving the lost of her husband of 14 yrs on 03/03/2021. She cannot sleep at night and feeling very overwhelmed. I scheduled her an appt with Rollene Fare at 1pm to discuss her depression.

## 2021-03-23 DIAGNOSIS — M4802 Spinal stenosis, cervical region: Secondary | ICD-10-CM | POA: Diagnosis not present

## 2021-03-23 DIAGNOSIS — M7552 Bursitis of left shoulder: Secondary | ICD-10-CM | POA: Diagnosis not present

## 2021-03-23 DIAGNOSIS — M5030 Other cervical disc degeneration, unspecified cervical region: Secondary | ICD-10-CM | POA: Diagnosis not present

## 2021-03-23 DIAGNOSIS — M7551 Bursitis of right shoulder: Secondary | ICD-10-CM | POA: Diagnosis not present

## 2021-03-24 ENCOUNTER — Other Ambulatory Visit: Payer: Self-pay

## 2021-03-24 ENCOUNTER — Encounter: Payer: Self-pay | Admitting: Family Medicine

## 2021-03-24 ENCOUNTER — Ambulatory Visit (INDEPENDENT_AMBULATORY_CARE_PROVIDER_SITE_OTHER): Payer: PPO | Admitting: Family Medicine

## 2021-03-24 VITALS — BP 142/78 | HR 81 | Temp 97.1°F | Resp 18 | Ht 65.0 in | Wt 205.4 lb

## 2021-03-24 DIAGNOSIS — F4321 Adjustment disorder with depressed mood: Secondary | ICD-10-CM

## 2021-03-24 DIAGNOSIS — F5104 Psychophysiologic insomnia: Secondary | ICD-10-CM

## 2021-03-24 DIAGNOSIS — Z23 Encounter for immunization: Secondary | ICD-10-CM

## 2021-03-24 MED ORDER — ZOLPIDEM TARTRATE 5 MG PO TABS: 5.0000 mg | ORAL_TABLET | Freq: Every day | ORAL | 1 refills | Status: DC

## 2021-03-24 MED ORDER — TRAZODONE HCL 50 MG PO TABS
50.0000 mg | ORAL_TABLET | Freq: Every day | ORAL | 1 refills | Status: DC
Start: 2021-03-24 — End: 2021-09-19

## 2021-03-24 NOTE — Patient Instructions (Addendum)
Thank you for coming to the office today.  Start the Zolpidem (Ambien) 5 mg nightly as needed for sleep/insomnia, start this first to see if you can catch up on rest. Caution with grogginess, sedation. If you do well on this and feel okay with your mix of other meds, then you may start the Trazodone 50mg  nightly medication this med takes time to work a few weeks, can start with this at 50mg  and may adjust in future. Take it every night for 4-6 weeks at first and longer if it is helping. In future we can reduce this and take it ONLY AS NEEDED.  For now continue the Citalopram 20mg  daily, in future this could be adjusted if need but for now keep this dose.  Please schedule a Follow-up Appointment to: Return if symptoms worsen or fail to improve.  If you have any other questions or concerns, please feel free to call the office or send a message through [place]. You may also schedule an earlier appointment if necessary.  Additionally, you may be receiving a survey about your experience at our office within a few days to 1 week by e-mail or mail. We value your feedback.  Nobie Putnam, DO [place]

## 2021-03-24 NOTE — Progress Notes (Addendum)
Subjective:    Patient ID: Linda Farley, female    DOB: 1950/11/21, 70 y.o.   MRN: [id number]  HAIDYN Farley is a 70 y.o. female presenting on 03/24/2021 for Depression (The pt comes in today grieving from the loss of her husband of 63yrs  that passed away 03/07/21, pt complains of feeling down and not able to sleep because of the crying spells. ) and Insomnia (Difficulty falling asleep, staying asleep because she have nightmares since her husband passed away. She state that she's tired Benadryl and melatonin with no improvement .)  PCP Webb Silversmith, FNP   HPI  Adjustment Disorder, depression Insomnia Here today due to acute depression adjustment symptoms following recent passing of her husband of 34 years. She has had difficulty with crying spells and depressed mood with anxiety and poor sleep now with worse insomnia. On Citalopram 20mg  daily. In the past she has taken Ambien before with good results. Failed OTC meds benadryl, melatonin supplement   RLS Taking Ropinirole XL 2mg  at bedtime, was helping more before, now with poor sleep she has had worsening RLS because she has not been able to sleep.  Chronic Pain, Shoulders / Back DDD Lumbar On Hydrocodone PRN On Gabapentin 300mg  1 in AM and 2 at PM. Also steroid shot from Dr Sharlet Salina    Depression screen [facility] 2/9 03/24/2021 10/25/2020 08/04/2019  Decreased Interest 3 2 1   Down, Depressed, Hopeless 3 1 0  PHQ - 2 Score 6 3 1   Altered sleeping 3 2 3   Tired, decreased energy 3 3 3   Change in appetite 3 2 0  Feeling bad or failure about yourself  3 2 0  Trouble concentrating 3 2 1   Moving slowly or fidgety/restless 0 1 0  Suicidal thoughts 0 0 0  PHQ-9 Score 21 15 8   Difficult doing work/chores Very difficult Not difficult at all Somewhat difficult   GAD 7 : Generalized Anxiety Score 10/25/2020 03/09/2019  Nervous, Anxious, on Edge 1 0  Control/stop worrying 2 0  Worry too much - different things 2 0  Trouble relaxing 2 0   Restless 0 0  Easily annoyed or irritable 1 1  Afraid - awful might happen 2 0  Total GAD 7 Score 10 1  Anxiety Difficulty Not difficult at all Not difficult at all      Social History   Tobacco Use   Smoking status: Never   Smokeless tobacco: Never  Vaping Use   Vaping Use: Never used  Substance Use Topics   Alcohol use: Yes    Alcohol/week: 1.0 standard drink    Types: 1 Glasses of wine per week    Comment: 3 wine coolers weekly   Drug use: Never    Review of Systems Per HPI unless specifically indicated above     Objective:    BP (!) 142/78 (BP Location: Left Arm, Patient Position: Sitting, Cuff Size: Large)   Pulse 81   Temp (!) 97.1 F (36.2 C) (Temporal)   Resp 18   Ht 5\' 5"  (1.651 m)   Wt 205 lb 6.4 oz (93.2 kg)   SpO2 100%   BMI 34.18 kg/m   Wt Readings from Last 3 Encounters:  03/24/21 205 lb 6.4 oz (93.2 kg)  12/07/20 203 lb (92.1 kg)  10/25/20 203 lb 3.2 oz (92.2 kg)    Physical Exam Vitals and nursing note reviewed.  Constitutional:      General: She is not in acute distress.  Appearance: Normal appearance. She is well-developed. She is not diaphoretic.     Comments: Well-appearing, comfortable, cooperative  HENT:     Head: Normocephalic and atraumatic.  Eyes:     General:        Right eye: No discharge.        Left eye: No discharge.     Conjunctiva/sclera: Conjunctivae normal.  Cardiovascular:     Rate and Rhythm: Normal rate.  Pulmonary:     Effort: Pulmonary effort is normal.  Skin:    General: Skin is warm and dry.     Findings: No erythema or rash.  Neurological:     Mental Status: She is alert and oriented to person, place, and time.  Psychiatric:        Mood and Affect: Mood normal.        Behavior: Behavior normal.        Thought Content: Thought content normal.     Comments: Well groomed, good eye contact, normal speech and thoughts   Results for orders placed or performed in visit on 11/01/20  Hemoglobin A1c  Result  Value Ref Range   Hgb A1c MFr Bld 5.8 (H) <5.7 % of total Hgb   Mean Plasma Glucose 120 mg/dL   eAG (mmol/L) 6.6 mmol/L  Lipid panel  Result Value Ref Range   Cholesterol 154 <200 mg/dL   HDL 55 > OR = 50 mg/dL   Triglycerides 130 <150 mg/dL   LDL Cholesterol (Calc) 78 mg/dL (calc)   Total CHOL/HDL Ratio 2.8 <5.0 (calc)   Non-HDL Cholesterol (Calc) 99 <130 mg/dL (calc)  Comprehensive metabolic panel  Result Value Ref Range   Glucose, Bld 77 65 - 99 mg/dL   BUN 24 7 - 25 mg/dL   Creat 1.06 (H) 0.60 - 0.93 mg/dL   BUN/Creatinine Ratio 23 (H) 6 - 22 (calc)   Sodium 141 135 - 146 mmol/L   Potassium 4.2 3.5 - 5.3 mmol/L   Chloride 103 98 - 110 mmol/L   CO2 32 20 - 32 mmol/L   Calcium 9.3 8.6 - 10.4 mg/dL   Total Protein 6.0 (L) 6.1 - 8.1 g/dL   Albumin 3.9 3.6 - 5.1 g/dL   Globulin 2.1 1.9 - 3.7 g/dL (calc)   AG Ratio 1.9 1.0 - 2.5 (calc)   Total Bilirubin 0.5 0.2 - 1.2 mg/dL   Alkaline phosphatase (APISO) 61 37 - 153 U/L   AST 15 10 - 35 U/L   ALT 10 6 - 29 U/L  CBC  Result Value Ref Range   WBC 6.9 3.8 - 10.8 Thousand/uL   RBC 4.41 3.80 - 5.10 Million/uL   Hemoglobin 13.5 11.7 - 15.5 g/dL   HCT 40.7 35.0 - 45.0 %   MCV 92.3 80.0 - 100.0 fL   MCH 30.6 27.0 - 33.0 pg   MCHC 33.2 32.0 - 36.0 g/dL   RDW 13.3 11.0 - 15.0 %   Platelets 191 140 - 400 Thousand/uL   MPV 12.2 7.5 - 12.5 fL      Assessment & Plan:   Problem List Items Addressed This Visit   None Visit Diagnoses     Adjustment disorder with depressed mood    -  Primary   Relevant Medications   traZODone (DESYREL) 50 MG tablet   Need for immunization against influenza       Relevant Orders   Flu Vaccine QUAD High Dose(Fluad) (Completed)   Psychophysiologic insomnia       Relevant Medications  zolpidem (AMBIEN) 5 MG tablet   traZODone (DESYREL) 50 MG tablet       Adjustment disorder w/ depressed mood / Bereavement reaction Hx Depression 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  Will also add Zolpidem IR 5mg  nightly PRN, with goodrx coupon to help get sleep now for improving her mood overall. Has worked before for her. Can use regular for few days to reset sleep cycle then take PRN only. May not be warranted long term. She has other medications that are high risk we reviewed this and precautions.  Additionally she has completed some initial hospice bereavement grief counseling. She has support system with sister. She denies any further mental health referral or counseling/therapy today  Meds ordered this encounter  Medications   zolpidem (AMBIEN) 5 MG tablet    Sig: Take 1 tablet (5 mg total) by mouth at bedtime.    Dispense:  30 tablet    Refill:  1   traZODone (DESYREL) 50 MG tablet    Sig: Take 1 tablet (50 mg total) by mouth at bedtime.    Dispense:  90 tablet    Refill:  1      Follow up plan: Return if symptoms worsen or fail to improve.   Nobie Putnam, [facility] 03/24/2021, 3:05 PM

## 2021-04-22 ENCOUNTER — Other Ambulatory Visit: Payer: Self-pay | Admitting: Internal Medicine

## 2021-04-22 NOTE — Telephone Encounter (Signed)
Requested Prescriptions  Pending Prescriptions Disp Refills  . lisinopril (ZESTRIL) 20 MG tablet [Pharmacy Med Name: Lisinopril 20 MG Oral Tablet] 90 tablet 1    Sig: Take 1 tablet by mouth once daily     Cardiovascular:  ACE Inhibitors Failed - 04/22/2021  3:52 AM      Failed - Cr in normal range and within 180 days    Creat  Date Value Ref Range Status  11/01/2020 1.06 (H) 0.60 - 0.93 mg/dL Final    Comment:    For patients >71 years of age, the reference limit for Creatinine is approximately 13% higher for people identified as African-American. .          Failed - Last BP in normal range    BP Readings from Last 1 Encounters:  03/24/21 (!) 142/78         Passed - K in normal range and within 180 days    Potassium  Date Value Ref Range Status  11/01/2020 4.2 3.5 - 5.3 mmol/L Final         Passed - Patient is not pregnant      Passed - Valid encounter within last 6 months    Recent Outpatient Visits          4 weeks ago Adjustment disorder with depressed mood   [place], DO   4 months ago Acute gout involving toe of right foot, unspecified cause   [facility], Devonne Doughty, DO   5 months ago Essential hypertension   [facility] [place], Coralie Keens, NP   12 months ago [facility] [place], Lupita Raider, FNP   1 year ago Essential hypertension   [facility], Lupita Raider, FNP      Future Appointments            In 2 weeks Garnette Gunner, Coralie Keens, NP [facility]           . furosemide (LASIX) 20 MG tablet [Pharmacy Med Name: Furosemide 20 MG Oral Tablet] 90 tablet 1    Sig: Take 1 tablet by mouth once daily     Cardiovascular:  Diuretics - Loop Failed - 04/22/2021  3:52 AM      Failed - Cr in normal range and within 360 days    Creat  Date Value Ref Range Status  11/01/2020 1.06 (H) 0.60 - 0.93 mg/dL Final    Comment:     For patients >52 years of age, the reference limit for Creatinine is approximately 13% higher for people identified as African-American. .          Failed - Last BP in normal range    BP Readings from Last 1 Encounters:  03/24/21 (!) 142/78         Passed - K in normal range and within 360 days    Potassium  Date Value Ref Range Status  11/01/2020 4.2 3.5 - 5.3 mmol/L Final         Passed - Ca in normal range and within 360 days    Calcium  Date Value Ref Range Status  11/01/2020 9.3 8.6 - 10.4 mg/dL Final         Passed - Na in normal range and within 360 days    Sodium  Date Value Ref Range Status  11/01/2020 141 135 - 146 mmol/L Final  Passed - Valid encounter within last 6 months    Recent Outpatient Visits          4 weeks ago Adjustment disorder with depressed mood   [place], DO   4 months ago Acute gout involving toe of right foot, unspecified cause   [place], DO   5 months ago Essential hypertension   [place], NP   12 months ago [facility] [place], Lupita Raider, FNP   1 year ago Essential hypertension   [facility], Lupita Raider, FNP      Future Appointments            In 2 weeks Garnette Gunner, Coralie Keens, NP [facility], [facility]

## 2021-05-09 ENCOUNTER — Other Ambulatory Visit: Payer: Self-pay

## 2021-05-09 ENCOUNTER — Ambulatory Visit (INDEPENDENT_AMBULATORY_CARE_PROVIDER_SITE_OTHER): Payer: PPO | Admitting: Internal Medicine

## 2021-05-09 ENCOUNTER — Telehealth: Payer: Self-pay | Admitting: Internal Medicine

## 2021-05-09 ENCOUNTER — Encounter: Payer: Self-pay | Admitting: Internal Medicine

## 2021-05-09 VITALS — BP 119/99 | HR 80 | Temp 97.9°F | Resp 17 | Ht 65.0 in | Wt 203.0 lb

## 2021-05-09 DIAGNOSIS — F5104 Psychophysiologic insomnia: Secondary | ICD-10-CM

## 2021-05-09 DIAGNOSIS — Z6833 Body mass index (BMI) 33.0-33.9, adult: Secondary | ICD-10-CM | POA: Diagnosis not present

## 2021-05-09 DIAGNOSIS — Z0001 Encounter for general adult medical examination with abnormal findings: Secondary | ICD-10-CM

## 2021-05-09 DIAGNOSIS — F329 Major depressive disorder, single episode, unspecified: Secondary | ICD-10-CM

## 2021-05-09 DIAGNOSIS — E6609 Other obesity due to excess calories: Secondary | ICD-10-CM

## 2021-05-09 NOTE — Progress Notes (Signed)
Subjective:    Patient ID: Linda Farley, female    DOB: [birth year], 70 y.o.   MRN: [id number]  HPI  Pt presents to the clinic today for her annual exam.  Flu: 03/2021 Tetanus: unsure Pneumovax: 12/2019 Prevnar: never Shingrix: never Covid: Moderna x 3 Pap smear: no longer screening Mammogram: no longer screening Bone density: 03/2020 Colon screening: 09/2019 Vision screening: as needed Dentist: as needed  Diet: She does eat meat. She consumes more veggies than fruits. She does eat some fried food. She drinks mostly Dt. Mt Dew. Exercise: None  Review of Systems     Past Medical History:  Diagnosis Date   Allergy    Anxiety    Arthritis    Depression    Hypertension     Current Outpatient Medications  Medication Sig Dispense Refill   albuterol (VENTOLIN HFA) 108 (90 Base) MCG/ACT inhaler INHALE 2 PUFFS BY MOUTH EVERY 6 HOURS AS NEEDED FOR WHEEZING OR  SHORTNESS  OF  BREATH 9 g 3   aspirin EC 325 MG tablet Take 1 tablet (325 mg total) by mouth daily. 30 tablet 0   atorvastatin (LIPITOR) 20 MG tablet Take 1 tablet (20 mg total) by mouth daily. (Patient not taking: Reported on 03/24/2021) 90 tablet 1   calcium carbonate (TUMS - DOSED IN MG ELEMENTAL CALCIUM) 500 MG chewable tablet Chew 1 tablet by mouth daily.     Cholecalciferol (D3 SUPER STRENGTH) 50 MCG (2000 UT) CAPS Take by mouth.     citalopram (CELEXA) 20 MG tablet Take 1 tablet (20 mg total) by mouth daily. 90 tablet 1   furosemide (LASIX) 20 MG tablet Take 1 tablet by mouth once daily 90 tablet 1   gabapentin (NEURONTIN) 300 MG capsule Take 1 capsule (300 mg total) by mouth as directed. One capsule in the morning and two capsules every day at bedtime 270 capsule 1   HYDROcodone-acetaminophen (NORCO) 7.5-325 MG tablet      lisinopril (ZESTRIL) 20 MG tablet Take 1 tablet by mouth once daily 90 tablet 1   meclizine (ANTIVERT) 12.5 MG tablet Take 1 tablet (12.5 mg total) by mouth 3 (three) times daily. (Patient not  taking: Reported on 03/24/2021) 90 tablet 1   NYSTATIN powder APPLY  POWDER TOPICALLY TO AFFECTED AREA THREE TIMES DAILY 30 g 0   rOPINIRole (REQUIP XL) 2 MG 24 hr tablet TAKE 1 TABLET BY MOUTH AT BEDTIME 90 tablet 2   traZODone (DESYREL) 50 MG tablet Take 1 tablet (50 mg total) by mouth at bedtime. 90 tablet 1   zolpidem (AMBIEN) 5 MG tablet Take 1 tablet (5 mg total) by mouth at bedtime. 30 tablet 1   No current facility-administered medications for this visit.    Allergies  Allergen Reactions   Chlorhexidine    Penicillins Rash    Family History  Problem Relation Age of Onset   Prostate cancer Father    Bone cancer Brother    Heart disease Brother     Social History   Socioeconomic History   Marital status: Married    Spouse name: Not on file   Number of children: Not on file   Years of education: Not on file   Highest education level: Not on file  Occupational History   Not on file  Tobacco Use   Smoking status: Never   Smokeless tobacco: Never  Vaping Use   Vaping Use: Never used  Substance and Sexual Activity   Alcohol use: Yes  Alcohol/week: 1.0 standard drink    Types: 1 Glasses of wine per week    Comment: 3 wine coolers weekly   Drug use: Never   Sexual activity: Not on file  Other Topics Concern   Not on file  Social History Narrative   Not on file   Social Determinants of Health   Financial Resource Strain: Not on file  Food Insecurity: Not on file  Transportation Needs: Not on file  Physical Activity: Not on file  Stress: Not on file  Social Connections: Not on file  Intimate Partner Violence: Not on file     Constitutional: Denies fever, malaise, fatigue, headache or abrupt weight changes.  HEENT: Denies eye pain, eye redness, ear pain, ringing in the ears, wax buildup, runny nose, nasal congestion, bloody nose, or sore throat. Respiratory: Denies difficulty breathing, shortness of breath, cough or sputum production.   Cardiovascular:  Denies chest pain, chest tightness, palpitations or swelling in the hands or feet.  Gastrointestinal: Denies abdominal pain, bloating, constipation, diarrhea or blood in the stool.  GU: Denies urgency, frequency, pain with urination, burning sensation, blood in urine, odor or discharge. Musculoskeletal: Denies decrease in range of motion, difficulty with gait, muscle pain or joint pain and swelling.  Skin: Pt reports discoloration of lower extremities. Denies redness, rashes, lesions or ulcercations.  Neurological: Pt reports restless legs. Denies dizziness, difficulty with memory, difficulty with speech or problems with balance and coordination.  Psych: Pt has a history of depression. Denies anxiety, SI/HI.  No other specific complaints in a complete review of systems (except as listed in HPI above).  Objective:   Physical Exam  BP (!) 119/99 (BP Location: Left Arm, Patient Position: Sitting, Cuff Size: Large)   Pulse 80   Temp 97.9 F (36.6 C) (Temporal)   Resp 17   Ht 5\' 5"  (1.651 m)   Wt 203 lb (92.1 kg)   SpO2 98%   BMI 33.78 kg/m   Wt Readings from Last 3 Encounters:  03/24/21 205 lb 6.4 oz (93.2 kg)  12/07/20 203 lb (92.1 kg)  10/25/20 203 lb 3.2 oz (92.2 kg)    General: Appears her stated age, obese, in NAD. Skin: Warm, dry and intact. Bluish discoloration noted of BLE, but warm to touch. Purpura noted. HEENT: Head: normal shape and size; Eyes: sclera white and EOMs intact;  Neck:  Neck supple, trachea midline. No masses, lumps or thyromegaly present.  Cardiovascular: Normal rate and rhythm. S1,S2 noted.  No murmur, rubs or gallops noted. No 1 + BLE edema. No carotid bruits noted. Pulmonary/Chest: Normal effort and positive vesicular breath sounds. No respiratory distress. No wheezes, rales or ronchi noted.  Abdomen: Soft and nontender. Normal bowel sounds. No distention or masses noted. Liver, spleen and kidneys non palpable. Musculoskeletal: Decreased external and  internal rotation of the shoulders. Strength 4/5 BUE, 5/5 BLE. No difficulty with gait.  Neurological: Alert and oriented. Cranial nerves II-XII grossly intact. Coordination normal.  Psychiatric: Mood and affect normal. Tearful. Judgment and thought content normal.    BMET    Component Value Date/Time   NA 141 11/01/2020 0755   K 4.2 11/01/2020 0755   CL 103 11/01/2020 0755   CO2 32 11/01/2020 0755   GLUCOSE 77 11/01/2020 0755   BUN 24 11/01/2020 0755   CREATININE 1.06 (H) 11/01/2020 0755   CALCIUM 9.3 11/01/2020 0755   GFRNONAA >60 12/23/2019 0508   GFRAA >60 12/23/2019 0508    Lipid Panel  Component Value Date/Time   CHOL 154 11/01/2020 0755   TRIG 130 11/01/2020 0755   HDL 55 11/01/2020 0755   CHOLHDL 2.8 11/01/2020 0755   VLDL 16 12/19/2019 0552   LDLCALC 78 11/01/2020 0755    CBC    Component Value Date/Time   WBC 6.9 11/01/2020 0755   RBC 4.41 11/01/2020 0755   HGB 13.5 11/01/2020 0755   HCT 40.7 11/01/2020 0755   PLT 191 11/01/2020 0755   MCV 92.3 11/01/2020 0755   MCH 30.6 11/01/2020 0755   MCHC 33.2 11/01/2020 0755   RDW 13.3 11/01/2020 0755   LYMPHSABS 2,651 09/03/2019 0928   EOSABS 74 09/03/2019 0928   BASOSABS 74 09/03/2019 0928    Hgb A1C Lab Results  Component Value Date   HGBA1C 5.8 (H) 11/01/2020           Assessment & Plan:   Preventative Health Maintenance:  Flu shot UTD She declines tetanus for financial reasons Pneumovax UTD She declines prevnar Discussed Shingrix vaccine, she will get this at the pharmacy if she decides to get it Encouraged her to get her covid vaccine She no longer wants to screen for cervical cancer She no longer wants to screen for breast cancer Bone density UTD Colon screening UTD Encouraged her to consume a balanced diet and exercise regimen Advised her to see an eye doctor and dentist annually Labs reviewed.   RTC in 6 months, follow up chronic conditions  Webb Silversmith, NP This visit  occurred during the SARS-CoV-2 public health emergency.  Safety protocols were in place, including screening questions prior to the visit, additional usage of staff PPE, and extensive cleaning of exam room while observing appropriate contact time as indicated for disinfecting solutions.

## 2021-05-09 NOTE — Telephone Encounter (Signed)
Pharmacy called in for patient states patient was told wuld have welbutrin sent in but no record of this. Please call back

## 2021-05-10 DIAGNOSIS — E6609 Other obesity due to excess calories: Secondary | ICD-10-CM | POA: Insufficient documentation

## 2021-05-10 DIAGNOSIS — F5104 Psychophysiologic insomnia: Secondary | ICD-10-CM | POA: Insufficient documentation

## 2021-05-10 DIAGNOSIS — E66811 Obesity, class 1: Secondary | ICD-10-CM | POA: Insufficient documentation

## 2021-05-10 MED ORDER — BUPROPION HCL ER (XL) 150 MG PO TB24: 150.0000 mg | ORAL_TABLET | Freq: Every day | ORAL | 2 refills | Status: DC

## 2021-05-10 MED ORDER — ZOLPIDEM TARTRATE 5 MG PO TABS: 5.0000 mg | ORAL_TABLET | Freq: Every day | ORAL | 0 refills | Status: DC

## 2021-05-10 NOTE — Telephone Encounter (Signed)
I did not mention anything to her about prednisone for her shoulder pain.  She needs shoulder replacement and she is aware of this.  The Wellbutrin has been sent in.

## 2021-05-10 NOTE — Assessment & Plan Note (Signed)
Deteriorated Will add Wellbutrin to regimen Support offered

## 2021-05-10 NOTE — Telephone Encounter (Signed)
Pt states she and Rollene Fare discussed her also getting a prednisone dose pack called in for her shoulder pain. Would like to know if Rollene Fare will call that in along with the wellbutrin?  [place] (N), [place]

## 2021-05-10 NOTE — Patient Instructions (Signed)
Health Maintenance for Postmenopausal Women ?Menopause is a normal process in which your ability to get pregnant comes to an end. This process happens slowly over many months or years, usually between the ages of 48 and 55. Menopause is complete when you have missed your menstrual period for 12 months. ?It is important to talk with your health care provider about some of the most common conditions that affect women after menopause (postmenopausal women). These include heart disease, cancer, and bone loss (osteoporosis). Adopting a healthy lifestyle and getting preventive care can help to promote your health and wellness. The actions you take can also lower your chances of developing some of these common conditions. ?What are the signs and symptoms of menopause? ?During menopause, you may have the following symptoms: ?Hot flashes. These can be moderate or severe. ?Night sweats. ?Decrease in sex drive. ?Mood swings. ?Headaches. ?Tiredness (fatigue). ?Irritability. ?Memory problems. ?Problems falling asleep or staying asleep. ?Talk with your health care provider about treatment options for your symptoms. ?Do I need hormone replacement therapy? ?Hormone replacement therapy is effective in treating symptoms that are caused by menopause, such as hot flashes and night sweats. ?Hormone replacement carries certain risks, especially as you become older. If you are thinking about using estrogen or estrogen with progestin, discuss the benefits and risks with your health care provider. ?How can I reduce my risk for heart disease and stroke? ?The risk of heart disease, heart attack, and stroke increases as you age. One of the causes may be a change in the body's hormones during menopause. This can affect how your body uses dietary fats, triglycerides, and cholesterol. Heart attack and stroke are medical emergencies. There are many things that you can do to help prevent heart disease and stroke. ?Watch your blood pressure ?High  blood pressure causes heart disease and increases the risk of stroke. This is more likely to develop in people who have high blood pressure readings or are overweight. ?Have your blood pressure checked: ?Every 3-5 years if you are 18-39 years of age. ?Every year if you are 40 years old or older. ?Eat a healthy diet ? ?Eat a diet that includes plenty of vegetables, fruits, low-fat dairy products, and lean protein. ?Do not eat a lot of foods that are high in solid fats, added sugars, or sodium. ?Get regular exercise ?Get regular exercise. This is one of the most important things you can do for your health. Most adults should: ?Try to exercise for at least 150 minutes each week. The exercise should increase your heart rate and make you sweat (moderate-intensity exercise). ?Try to do strengthening exercises at least twice each week. Do these in addition to the moderate-intensity exercise. ?Spend less time sitting. Even light physical activity can be beneficial. ?Other tips ?Work with your health care provider to achieve or maintain a healthy weight. ?Do not use any products that contain nicotine or tobacco. These products include cigarettes, chewing tobacco, and vaping devices, such as e-cigarettes. If you need help quitting, ask your health care provider. ?Know your numbers. Ask your health care provider to check your cholesterol and your blood sugar (glucose). Continue to have your blood tested as directed by your health care provider. ?Do I need screening for cancer? ?Depending on your health history and family history, you may need to have cancer screenings at different stages of your life. This may include screening for: ?Breast cancer. ?Cervical cancer. ?Lung cancer. ?Colorectal cancer. ?What is my risk for osteoporosis? ?After menopause, you may be   at increased risk for osteoporosis. Osteoporosis is a condition in which bone destruction happens more quickly than new bone creation. To help prevent osteoporosis or  the bone fractures that can happen because of osteoporosis, you may take the following actions: ?If you are 19-50 years old, get at least 1,000 mg of calcium and at least 600 international units (IU) of vitamin D per day. ?If you are older than age 50 but younger than age 70, get at least 1,200 mg of calcium and at least 600 international units (IU) of vitamin D per day. ?If you are older than age 70, get at least 1,200 mg of calcium and at least 800 international units (IU) of vitamin D per day. ?Smoking and drinking excessive alcohol increase the risk of osteoporosis. Eat foods that are rich in calcium and vitamin D, and do weight-bearing exercises several times each week as directed by your health care provider. ?How does menopause affect my mental health? ?Depression may occur at any age, but it is more common as you become older. Common symptoms of depression include: ?Feeling depressed. ?Changes in sleep patterns. ?Changes in appetite or eating patterns. ?Feeling an overall lack of motivation or enjoyment of activities that you previously enjoyed. ?Frequent crying spells. ?Talk with your health care provider if you think that you are experiencing any of these symptoms. ?General instructions ?See your health care provider for regular wellness exams and vaccines. This may include: ?Scheduling regular health, dental, and eye exams. ?Getting and maintaining your vaccines. These include: ?Influenza vaccine. Get this vaccine each year before the flu season begins. ?Pneumonia vaccine. ?Shingles vaccine. ?Tetanus, diphtheria, and pertussis (Tdap) booster vaccine. ?Your health care provider may also recommend other immunizations. ?Tell your health care provider if you have ever been abused or do not feel safe at home. ?Summary ?Menopause is a normal process in which your ability to get pregnant comes to an end. ?This condition causes hot flashes, night sweats, decreased interest in sex, mood swings, headaches, or lack  of sleep. ?Treatment for this condition may include hormone replacement therapy. ?Take actions to keep yourself healthy, including exercising regularly, eating a healthy diet, watching your weight, and checking your blood pressure and blood sugar levels. ?Get screened for cancer and depression. Make sure that you are up to date with all your vaccines. ?This information is not intended to replace advice given to you by your health care provider. Make sure you discuss any questions you have with your health care provider. ?Document Revised: 10/17/2020 Document Reviewed: 10/17/2020 ?Elsevier Patient Education ? 2022 [employer] ? ?

## 2021-05-10 NOTE — Assessment & Plan Note (Signed)
Not completely comfortable with her taking Trazadone and Ambien together, will discuss further at next visit.  Ambien refilled today

## 2021-05-10 NOTE — Assessment & Plan Note (Signed)
Encouraged diet and exercise for weight loss ?

## 2021-05-21 ENCOUNTER — Other Ambulatory Visit: Payer: Self-pay | Admitting: Internal Medicine

## 2021-05-21 DIAGNOSIS — F329 Major depressive disorder, single episode, unspecified: Secondary | ICD-10-CM

## 2021-05-21 NOTE — Telephone Encounter (Signed)
Requested Prescriptions  Pending Prescriptions Disp Refills  . citalopram (CELEXA) 20 MG tablet [Pharmacy Med Name: Citalopram Hydrobromide 20 MG Oral Tablet] 90 tablet 1    Sig: Take 1 tablet by mouth once daily     Psychiatry:  Antidepressants - SSRI Passed - 05/21/2021  2:18 AM      Passed - Completed PHQ-2 or PHQ-9 in the last 360 days      Passed - Valid encounter within last 6 months    Recent Outpatient Visits          1 week ago Encounter for general adult medical examination with abnormal findings   [facility] [place], Coralie Keens, NP   1 month ago Adjustment disorder with depressed mood   [place], DO   5 months ago Acute gout involving toe of right foot, unspecified cause   [facility] [place], Devonne Doughty, DO   6 months ago Essential hypertension   [facility] [place], Coralie Keens, NP   1 year ago [facility] [place], Lupita Raider, FNP      Future Appointments            In 5 months Baity, Coralie Keens, NP [facility], [facility]

## 2021-06-18 ENCOUNTER — Other Ambulatory Visit: Payer: Self-pay | Admitting: Internal Medicine

## 2021-06-18 DIAGNOSIS — F5104 Psychophysiologic insomnia: Secondary | ICD-10-CM

## 2021-06-18 NOTE — Telephone Encounter (Signed)
Requested medication (s) are due for refill today: yes  Requested medication (s) are on the active medication list: yes  Last refill:  05/10/21 #30  Future visit scheduled: yes  Notes to clinic:  med not delegated to NT to RF   Requested Prescriptions  Pending Prescriptions Disp Refills   zolpidem (AMBIEN) 5 MG tablet [Pharmacy Med Name: Zolpidem Tartrate 5 MG Oral Tablet] 30 tablet 0    Sig: TAKE 1 TABLET BY MOUTH AT BEDTIME     Not Delegated - Psychiatry:  Anxiolytics/Hypnotics Failed - 06/18/2021  6:11 PM      Failed - This refill cannot be delegated      Failed - Urine Drug Screen completed in last 360 days      Passed - Valid encounter within last 6 months    Recent Outpatient Visits           1 month ago Encounter for general adult medical examination with abnormal findings   [facility] [place], Coralie Keens, NP   2 months ago Adjustment disorder with depressed mood   [place], DO   6 months ago Acute gout involving toe of right foot, unspecified cause   [facility] [place], Devonne Doughty, DO   7 months ago Essential hypertension   [facility] [place], Coralie Keens, NP   1 year ago [facility] [place], Lupita Raider, FNP       Future Appointments             In 4 months Baity, Coralie Keens, NP [facility], [facility]

## 2021-06-22 ENCOUNTER — Other Ambulatory Visit: Payer: Self-pay | Admitting: Internal Medicine

## 2021-06-22 NOTE — Telephone Encounter (Signed)
Requested medications are due for refill today.  unsure  Requested medications are on the active medications list.  no  Last refill. 11/08/2020  Future visit scheduled.   yes  Notes to clinic.  Medication was d/c'd 02/21/2021. No protocol assigned for this medication. Please review.    Requested Prescriptions  Pending Prescriptions Disp Refills   nystatin (MYCOSTATIN/NYSTOP) powder [Pharmacy Med Name: Nystatin 100000 UNIT/GM External Powder] 30 g 0    Sig: APPLY  POWDER TOPICALLY TO AFFECTED AREA THREE TIMES DAILY     Off-Protocol Failed - 06/22/2021  5:30 AM      Failed - Medication not assigned to a protocol, review manually.      Passed - Valid encounter within last 12 months    Recent Outpatient Visits           1 month ago Encounter for general adult medical examination with abnormal findings   [facility] [place], Coralie Keens, NP   3 months ago Adjustment disorder with depressed mood   [place], DO   6 months ago Acute gout involving toe of right foot, unspecified cause   [facility] [place], Devonne Doughty, DO   8 months ago Essential hypertension   [facility] [place], Coralie Keens, NP   1 year ago [facility] [place], Lupita Raider, FNP       Future Appointments             In 4 months Baity, Coralie Keens, NP [facility], [facility]

## 2021-07-19 ENCOUNTER — Other Ambulatory Visit: Payer: Self-pay | Admitting: Internal Medicine

## 2021-07-19 DIAGNOSIS — F5104 Psychophysiologic insomnia: Secondary | ICD-10-CM

## 2021-07-19 NOTE — Telephone Encounter (Signed)
Requested medication (s) are due for refill today: yes  Requested medication (s) are on the active medication list: yes  Last refill:  06/19/21 #30 0 refills  Future visit scheduled: yes in 3 months  Notes to clinic:  not delegated per protocol      Requested Prescriptions  Pending Prescriptions Disp Refills   zolpidem (AMBIEN) 5 MG tablet [Pharmacy Med Name: Zolpidem Tartrate 5 MG Oral Tablet] 30 tablet 0    Sig: TAKE 1 TABLET BY MOUTH AT BEDTIME     Not Delegated - Psychiatry:  Anxiolytics/Hypnotics Failed - 07/19/2021 10:30 AM      Failed - This refill cannot be delegated      Failed - Urine Drug Screen completed in last 360 days      Passed - Valid encounter within last 6 months    Recent Outpatient Visits           2 months ago Encounter for general adult medical examination with abnormal findings   [facility] [place], Coralie Keens, NP   3 months ago Adjustment disorder with depressed mood   [place], DO   7 months ago Acute gout involving toe of right foot, unspecified cause   [facility] [place], Devonne Doughty, DO   8 months ago Essential hypertension   [facility] [place], Coralie Keens, NP   1 year ago [facility] [place], Lupita Raider, FNP       Future Appointments             In 3 months Baity, Coralie Keens, NP [facility], [facility]

## 2021-07-23 ENCOUNTER — Other Ambulatory Visit: Payer: Self-pay | Admitting: Internal Medicine

## 2021-07-23 DIAGNOSIS — F5104 Psychophysiologic insomnia: Secondary | ICD-10-CM

## 2021-07-24 NOTE — Telephone Encounter (Signed)
Requested medications are due for refill today.  no  Requested medications are on the active medications list.  yes  Last refill. 07/19/2021 #30  Future visit scheduled.   yes  Notes to clinic.  Medication not due for refill. Medication not delegated.    Requested Prescriptions  Pending Prescriptions Disp Refills   zolpidem (AMBIEN) 5 MG tablet [Pharmacy Med Name: Zolpidem Tartrate 5 MG Oral Tablet] 30 tablet 0    Sig: TAKE 1 TABLET BY MOUTH AT BEDTIME     Not Delegated - Psychiatry:  Anxiolytics/Hypnotics Failed - 07/23/2021 11:59 AM      Failed - This refill cannot be delegated      Failed - Urine Drug Screen completed in last 360 days      Passed - Valid encounter within last 6 months    Recent Outpatient Visits           2 months ago Encounter for general adult medical examination with abnormal findings   [facility] [place], Coralie Keens, NP   4 months ago Adjustment disorder with depressed mood   [place], DO   7 months ago Acute gout involving toe of right foot, unspecified cause   [facility] [place], Devonne Doughty, DO   9 months ago Essential hypertension   [facility] [place], Coralie Keens, NP   1 year ago [facility] [place], Lupita Raider, FNP       Future Appointments             In 3 months Baity, Coralie Keens, NP [facility], [facility]

## 2021-08-20 ENCOUNTER — Other Ambulatory Visit: Payer: Self-pay | Admitting: Internal Medicine

## 2021-08-20 DIAGNOSIS — F5104 Psychophysiologic insomnia: Secondary | ICD-10-CM

## 2021-08-21 NOTE — Telephone Encounter (Signed)
Requested Prescriptions  ?Pending Prescriptions Disp Refills  ?? zolpidem (AMBIEN) 5 MG tablet [Pharmacy Med Name: Zolpidem Tartrate 5 MG Oral Tablet] 30 tablet 0  ?  Sig: TAKE 1 TABLET BY MOUTH AT BEDTIME  ?  ? Not Delegated - Psychiatry:  Anxiolytics/Hypnotics Failed - 08/20/2021  5:16 PM  ?  ?  Failed - This refill cannot be delegated  ?  ?  Failed - Urine Drug Screen completed in last 360 days  ?  ?  Passed - Valid encounter within last 6 months  ?  Recent Outpatient Visits   ?      ? 3 months ago Encounter for general adult medical examination with abnormal findings  ? [facility] [place], [place], NP  ? 5 months ago Adjustment disorder with depressed mood  ? [place], DO  ? 8 months ago Acute gout involving toe of right foot, unspecified cause  ? [place], DO  ? 10 months ago Essential hypertension  ? [facility] [place], Coralie Keens, NP  ? 1 year ago Vertigo  ? [facility], Lupita Raider, FNP  ?  ?  ?Future Appointments   ?        ? In 2 months Baity, Coralie Keens, NP [facility], [place]  ?  ? ?  ?  ?  ?? buPROPion (WELLBUTRIN XL) 150 MG 24 hr tablet [Pharmacy Med Name: buPROPion HCl ER (XL) 150 MG Oral Tablet Extended Release 24 Hour] 30 tablet 0  ?  Sig: Take 1 tablet by mouth once daily  ?  ? Psychiatry: Antidepressants - bupropion Failed - 08/20/2021  5:16 PM  ?  ?  Failed - Cr in normal range and within 360 days  ?  Creat  ?Date Value Ref Range Status  ?11/01/2020 1.06 (H) 0.60 - 0.93 mg/dL Final  ?  Comment:  ?  For patients >51 years of age, the reference limit ?for Creatinine is approximately 13% higher for people ?identified as African-American. ?. ?  ?   ?  ?  Failed - Last BP in normal range  ?  BP Readings from Last 1 Encounters:  ?05/09/21 (!) 119/99  ?   ?  ?  Passed - AST in normal range and within 360 days  ?  AST  ?Date Value Ref Range Status  ?11/01/2020  15 10 - 35 U/L Final  ?   ?  ?  Passed - ALT in normal range and within 360 days  ?  ALT  ?Date Value Ref Range Status  ?11/01/2020 10 6 - 29 U/L Final  ?   ?  ?  Passed - Completed PHQ-2 or PHQ-9 in the last 360 days  ?  ?  Passed - Valid encounter within last 6 months  ?  Recent Outpatient Visits   ?      ? 3 months ago Encounter for general adult medical examination with abnormal findings  ? [facility], [place], NP  ? 5 months ago Adjustment disorder with depressed mood  ? [place], DO  ? 8 months ago Acute gout involving toe of right foot, unspecified cause  ? [place], DO  ? 10 months ago Essential hypertension  ? [facility] [place], Coralie Keens, NP  ? 1 year ago Vertigo  ?  [facility], Lupita Raider, FNP  ?  ?  ?Future Appointments   ?        ? In 2 months Baity, Coralie Keens, NP [facility], [place]  ?  ? ?  ?  ?  ? ?

## 2021-08-21 NOTE — Telephone Encounter (Signed)
Requested medications are due for refill today.  yes ? ?Requested medications are on the active medications list.  yes ? ?Last refill. 07/19/2021 #30 0 refills ? ?Future visit scheduled.   yes ? ?Notes to clinic.  Medication refill not delegated. ? ? ? ?Requested Prescriptions  ?Pending Prescriptions Disp Refills  ? zolpidem (AMBIEN) 5 MG tablet [Pharmacy Med Name: Zolpidem Tartrate 5 MG Oral Tablet] 30 tablet 0  ?  Sig: TAKE 1 TABLET BY MOUTH AT BEDTIME  ?  ? Not Delegated - Psychiatry:  Anxiolytics/Hypnotics Failed - 08/20/2021  5:16 PM  ?  ?  Failed - This refill cannot be delegated  ?  ?  Failed - Urine Drug Screen completed in last 360 days  ?  ?  Passed - Valid encounter within last 6 months  ?  Recent Outpatient Visits   ? ?      ? 3 months ago Encounter for general adult medical examination with abnormal findings  ? [facility] [place], [place], NP  ? 5 months ago Adjustment disorder with depressed mood  ? [place], DO  ? 8 months ago Acute gout involving toe of right foot, unspecified cause  ? [place], DO  ? 10 months ago Essential hypertension  ? [facility] [place], Coralie Keens, NP  ? 1 year ago Vertigo  ? [facility], Lupita Raider, FNP  ? ?  ?  ?Future Appointments   ? ?        ? In 2 months Baity, Coralie Keens, NP [facility], [place]  ? ?  ? ?  ?  ?  ?Signed Prescriptions Disp Refills  ? buPROPion (WELLBUTRIN XL) 150 MG 24 hr tablet 30 tablet 2  ?  Sig: Take 1 tablet by mouth once daily  ?  ? Psychiatry: Antidepressants - bupropion Failed - 08/20/2021  5:16 PM  ?  ?  Failed - Cr in normal range and within 360 days  ?  Creat  ?Date Value Ref Range Status  ?11/01/2020 1.06 (H) 0.60 - 0.93 mg/dL Final  ?  Comment:  ?  For patients >61 years of age, the reference limit ?for Creatinine is approximately 13% higher for people ?identified as African-American. ?. ?  ?  ?  ?  ?   Failed - Last BP in normal range  ?  BP Readings from Last 1 Encounters:  ?05/09/21 (!) 119/99  ?  ?  ?  ?  Passed - AST in normal range and within 360 days  ?  AST  ?Date Value Ref Range Status  ?11/01/2020 15 10 - 35 U/L Final  ?  ?  ?  ?  Passed - ALT in normal range and within 360 days  ?  ALT  ?Date Value Ref Range Status  ?11/01/2020 10 6 - 29 U/L Final  ?  ?  ?  ?  Passed - Completed PHQ-2 or PHQ-9 in the last 360 days  ?  ?  Passed - Valid encounter within last 6 months  ?  Recent Outpatient Visits   ? ?      ? 3 months ago Encounter for general adult medical examination with abnormal findings  ? [facility] [place], [place], NP  ? 5 months ago Adjustment disorder with depressed mood  ? [place], DO  ? 8  months ago Acute gout involving toe of right foot, unspecified cause  ? [place], DO  ? 10 months ago Essential hypertension  ? [facility] [place], Coralie Keens, NP  ? 1 year ago Vertigo  ? [facility], Lupita Raider, FNP  ? ?  ?  ?Future Appointments   ? ?        ? In 2 months Baity, Coralie Keens, NP [facility], [place]  ? ?  ? ?  ?  ?  ?  ?

## 2021-09-04 ENCOUNTER — Other Ambulatory Visit: Payer: Self-pay | Admitting: Internal Medicine

## 2021-09-06 NOTE — Telephone Encounter (Signed)
Requested medication (s) are due for refill today: yes ? ?Requested medication (s) are on the active medication list: no ? ?Last refill:  11/08/20 ? ?Future visit scheduled: yes ? ?Notes to clinic:  rx was dc'd on 02/21/21. Please advise ? ? ?  ?Requested Prescriptions  ?Pending Prescriptions Disp Refills  ? nystatin (MYCOSTATIN/NYSTOP) powder [Pharmacy Med Name: Nystatin 100000 UNIT/GM External Powder] 30 g 0  ?  Sig: APPLY  POWDER TOPICALLY TO AFFECTED AREA THREE TIMES DAILY  ?  ? Off-Protocol Failed - 09/04/2021  2:31 PM  ?  ?  Failed - Medication not assigned to a protocol, review manually.  ?  ?  Passed - Valid encounter within last 12 months  ?  Recent Outpatient Visits   ? ?      ? 4 months ago Encounter for general adult medical examination with abnormal findings  ? [facility] [place], [place], NP  ? 5 months ago Adjustment disorder with depressed mood  ? [place], DO  ? 9 months ago Acute gout involving toe of right foot, unspecified cause  ? [place], DO  ? 10 months ago Essential hypertension  ? [facility] [place], Coralie Keens, NP  ? 1 year ago Vertigo  ? [facility], Lupita Raider, FNP  ? ?  ?  ?Future Appointments   ? ?        ? In 2 months Baity, Coralie Keens, NP [facility], [place]  ? ?  ? ?  ?  ?  ? ?

## 2021-09-15 IMAGING — CT CT HIP*R* W/O CM
3 series · 12 of 35 positions shown, 14 images · non-contrast
Comparison: Plain film from earlier in the same day.

CLINICAL DATA: Right hip pain, known degenerative change and recent
injury, initial encounter

EXAM:
CT OF THE RIGHT HIP WITHOUT CONTRAST
TECHNIQUE: Multidetector CT imaging of the right hip was performed according to
the standard protocol. Multiplanar CT image reconstructions were
also generated.

[Series 4: axial st · axial · 0.45mm/px · z∈[-720,-614]mm · 4 of 77 slices shown, 5 images]
[im 12/77  soft-tissue]
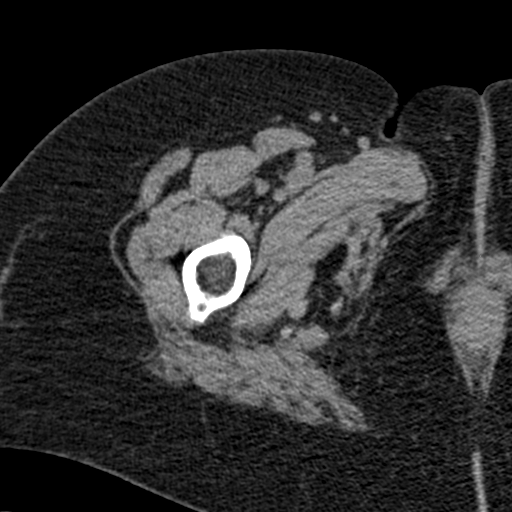
[im 12/77  bone]
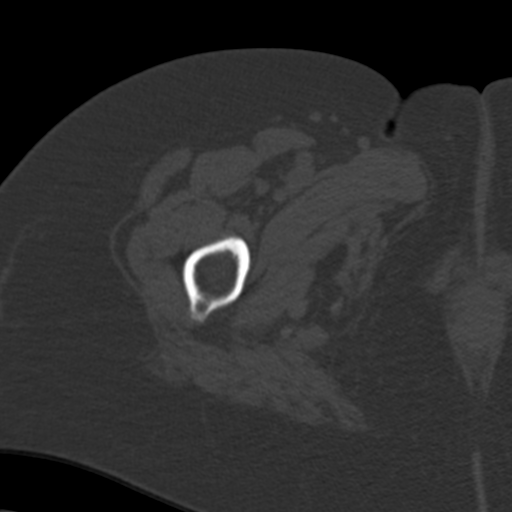
[im 30/77  bone]
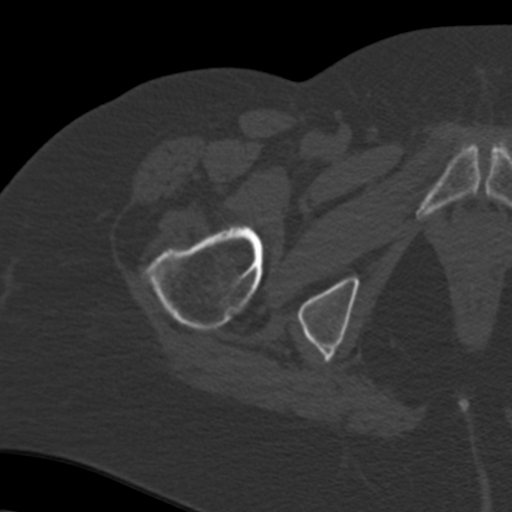
[im 47/77  bone]
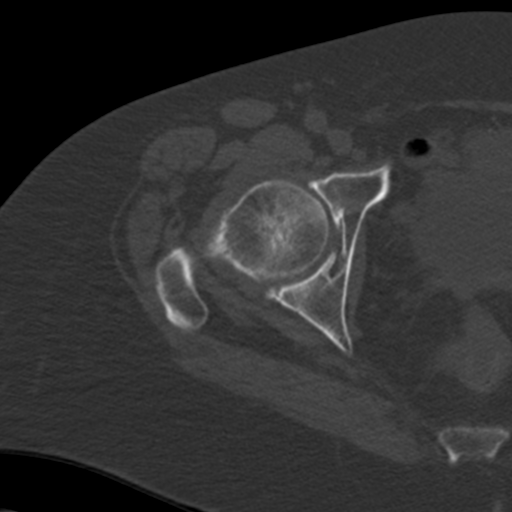
[im 65/77  bone]
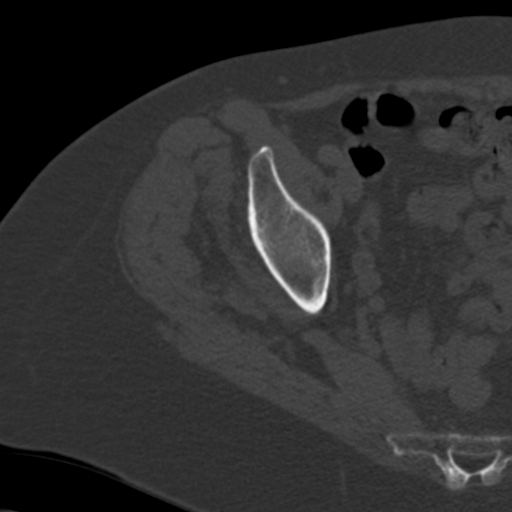

[Series 9: coronal st · coronal · 0.31mm/px · 3 of 125 slices shown]
[im 25/125  bone]
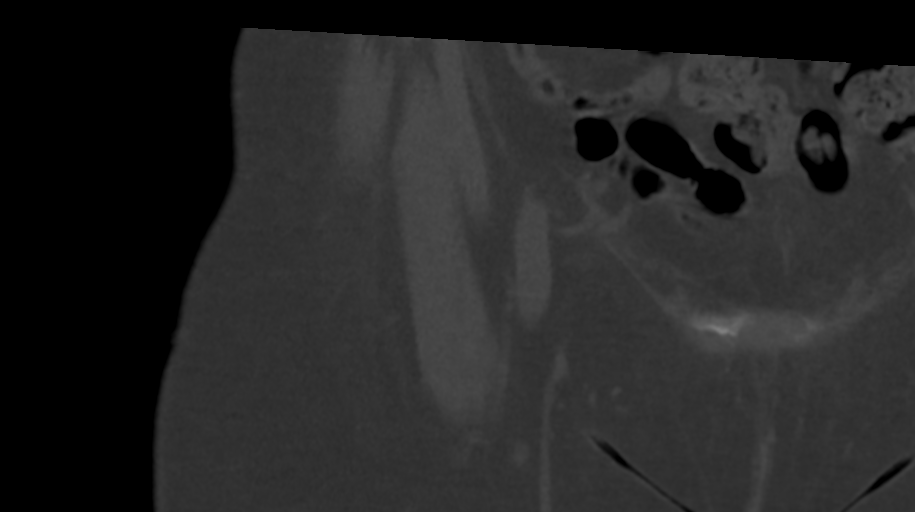
[im 50/125  bone]
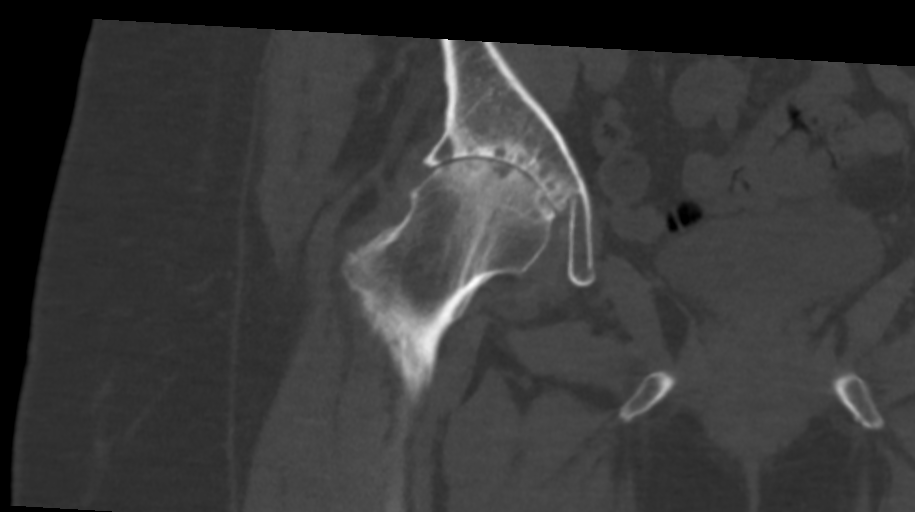
[im 75/125  bone]
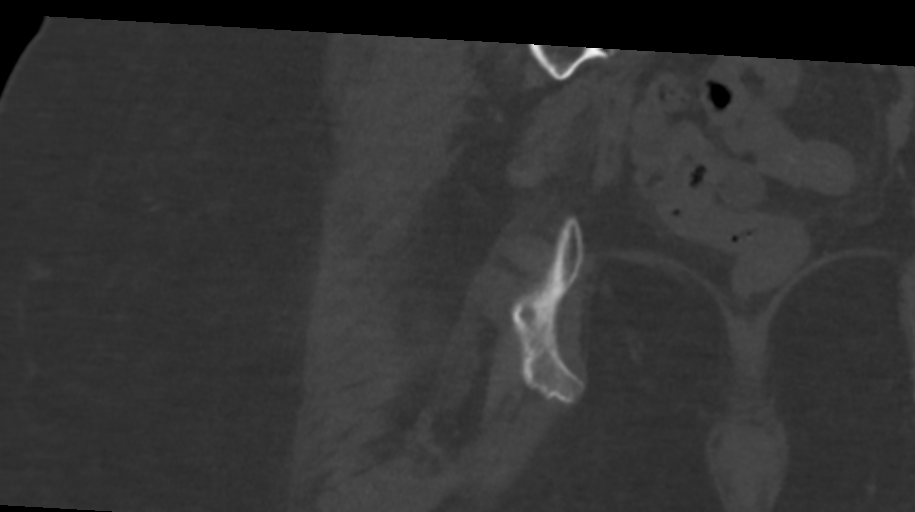

[Series 10: sagittal st · sagittal · 0.31mm/px · 5 of 144 slices shown, 6 images]
[im 48/144  bone]
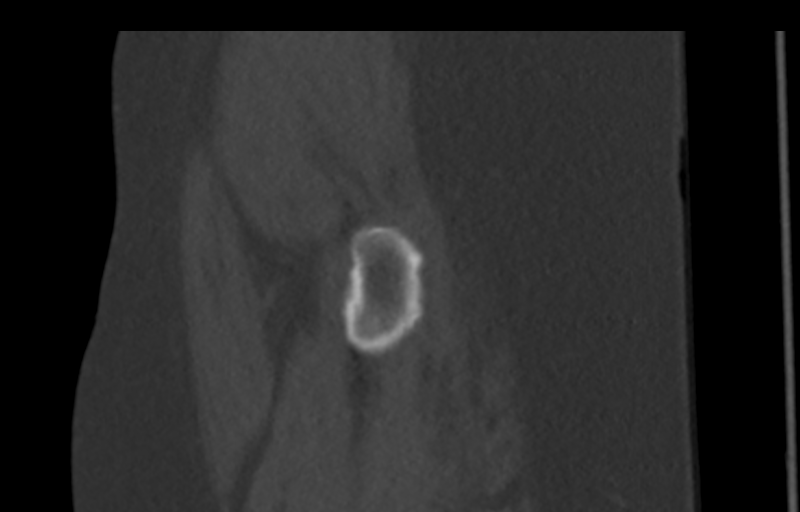
[im 60/144  bone]
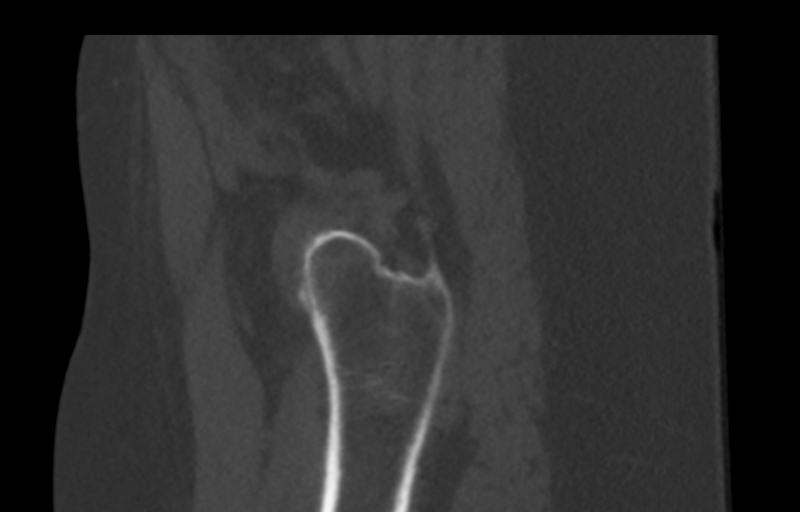
[im 72/144  soft-tissue]
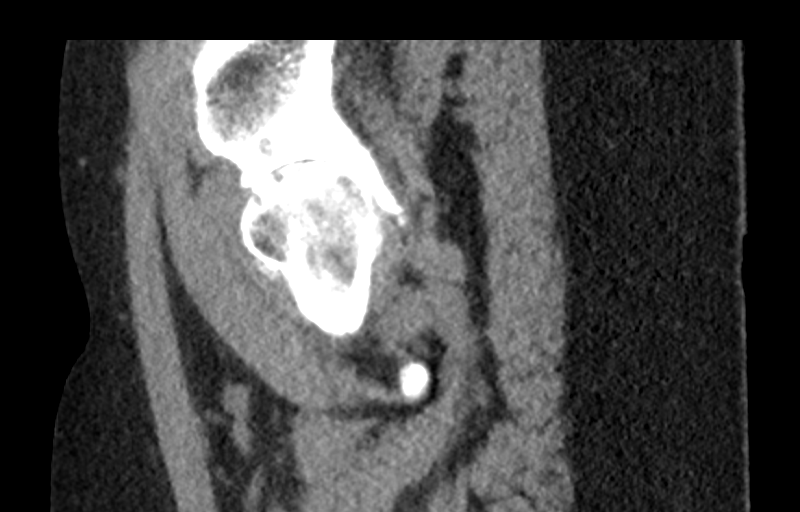
[im 72/144  bone]
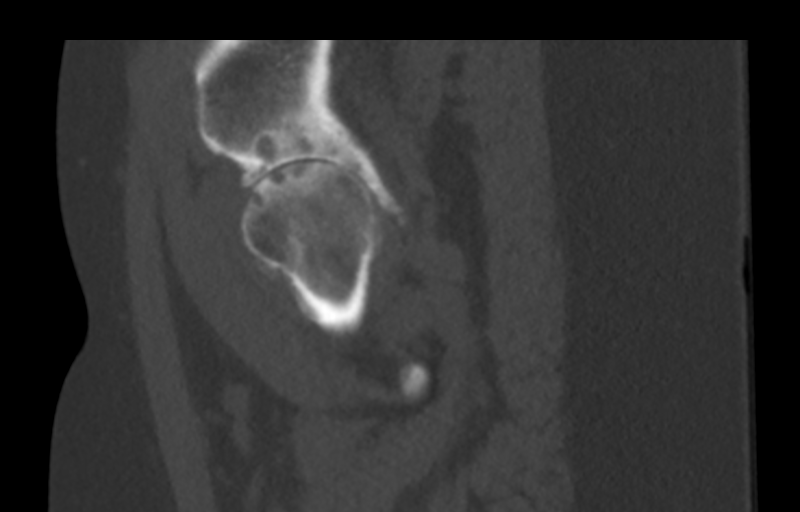
[im 84/144  bone]
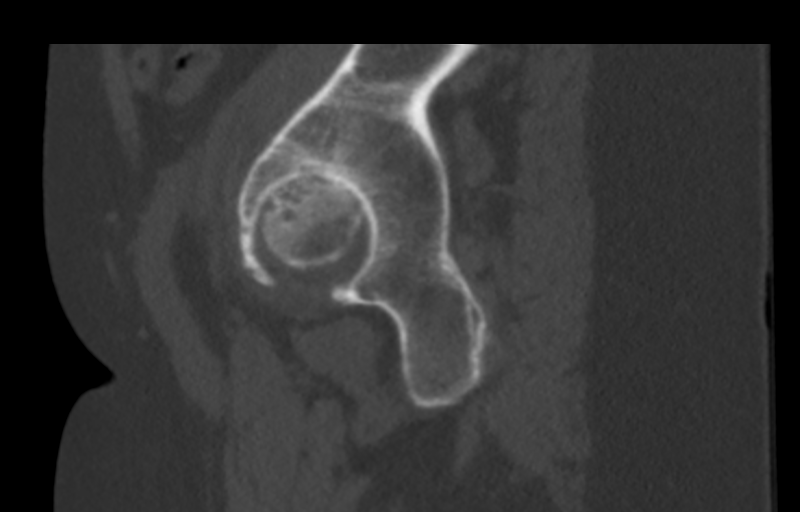
[im 96/144  bone]
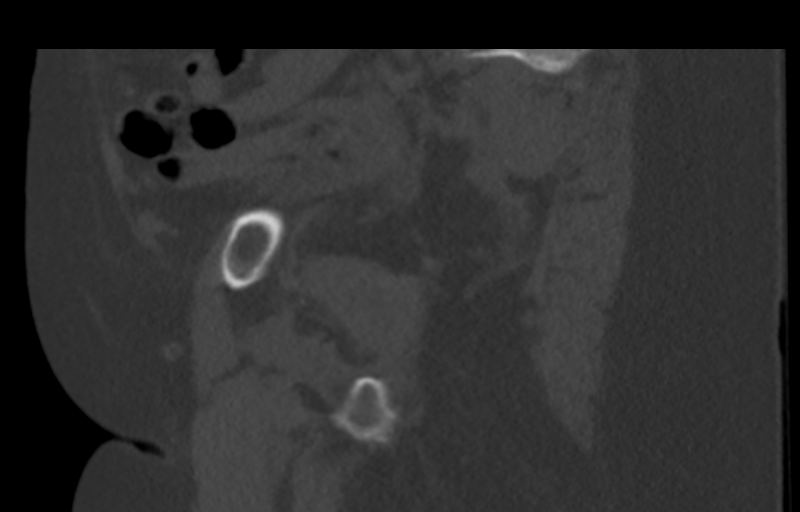

[12 of 35 positions shown; findings below may reference images not displayed]

FINDINGS: Bones/Joint/Cartilage

Degenerative changes of the right hip joint are noted. Subchondral
sclerosis and cyst formation is seen.

Ligaments

Suboptimally assessed by CT.

Muscles and Tendons

No muscular abnormality is noted.

Soft tissues

Surrounding soft tissue structures appear within normal limits with
the exception of a small joint effusion in the right hip. Visualized
pelvic structures are within normal limits.
IMPRESSION: Degenerative changes the right hip joint without acute fracture. No
findings to suggest avascular necrosis are noted as suggested on
prior plain film.

Small right hip joint effusion.

## 2021-09-15 IMAGING — CR DG HIP (WITH OR WITHOUT PELVIS) 2-3V*R*
3 series · 3 of 3 positions shown · non-contrast
Comparison: None.

CLINICAL DATA: Recent fall with right hip pain, initial encounter

EXAM:
DG HIP (WITH OR WITHOUT PELVIS) 3V RIGHT

[pelvis ap]
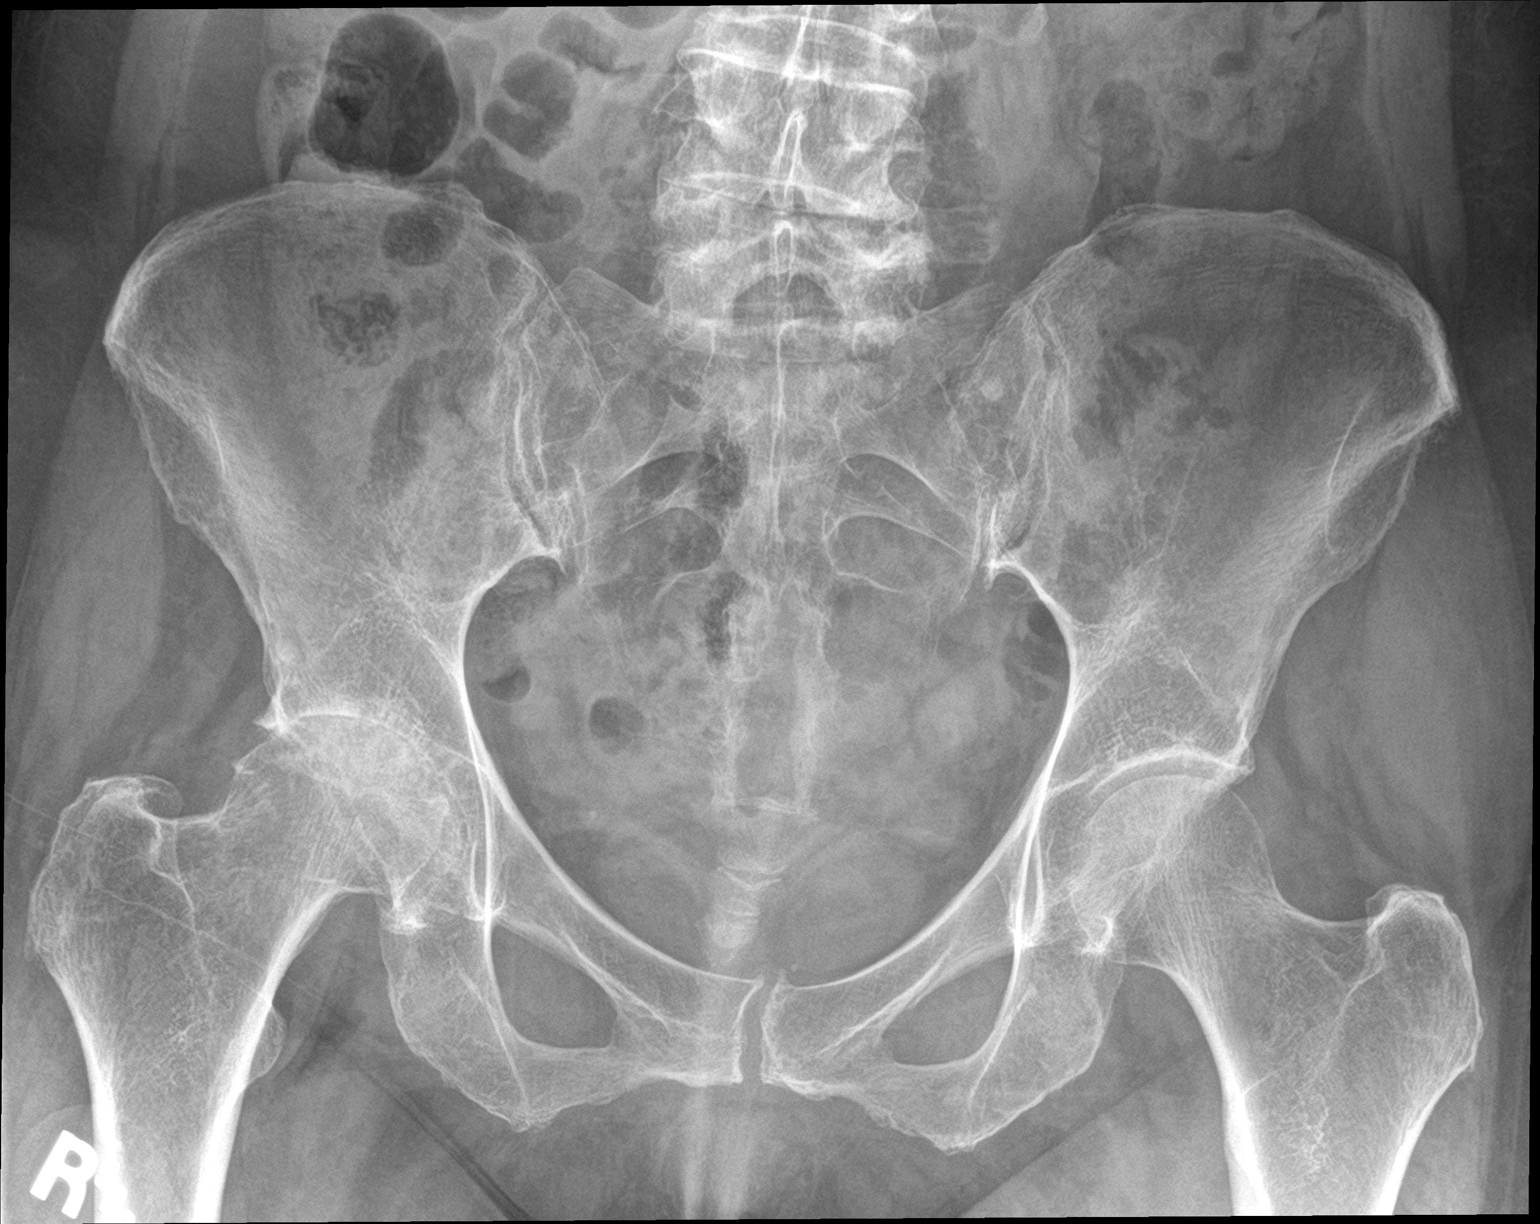

[hip ap]
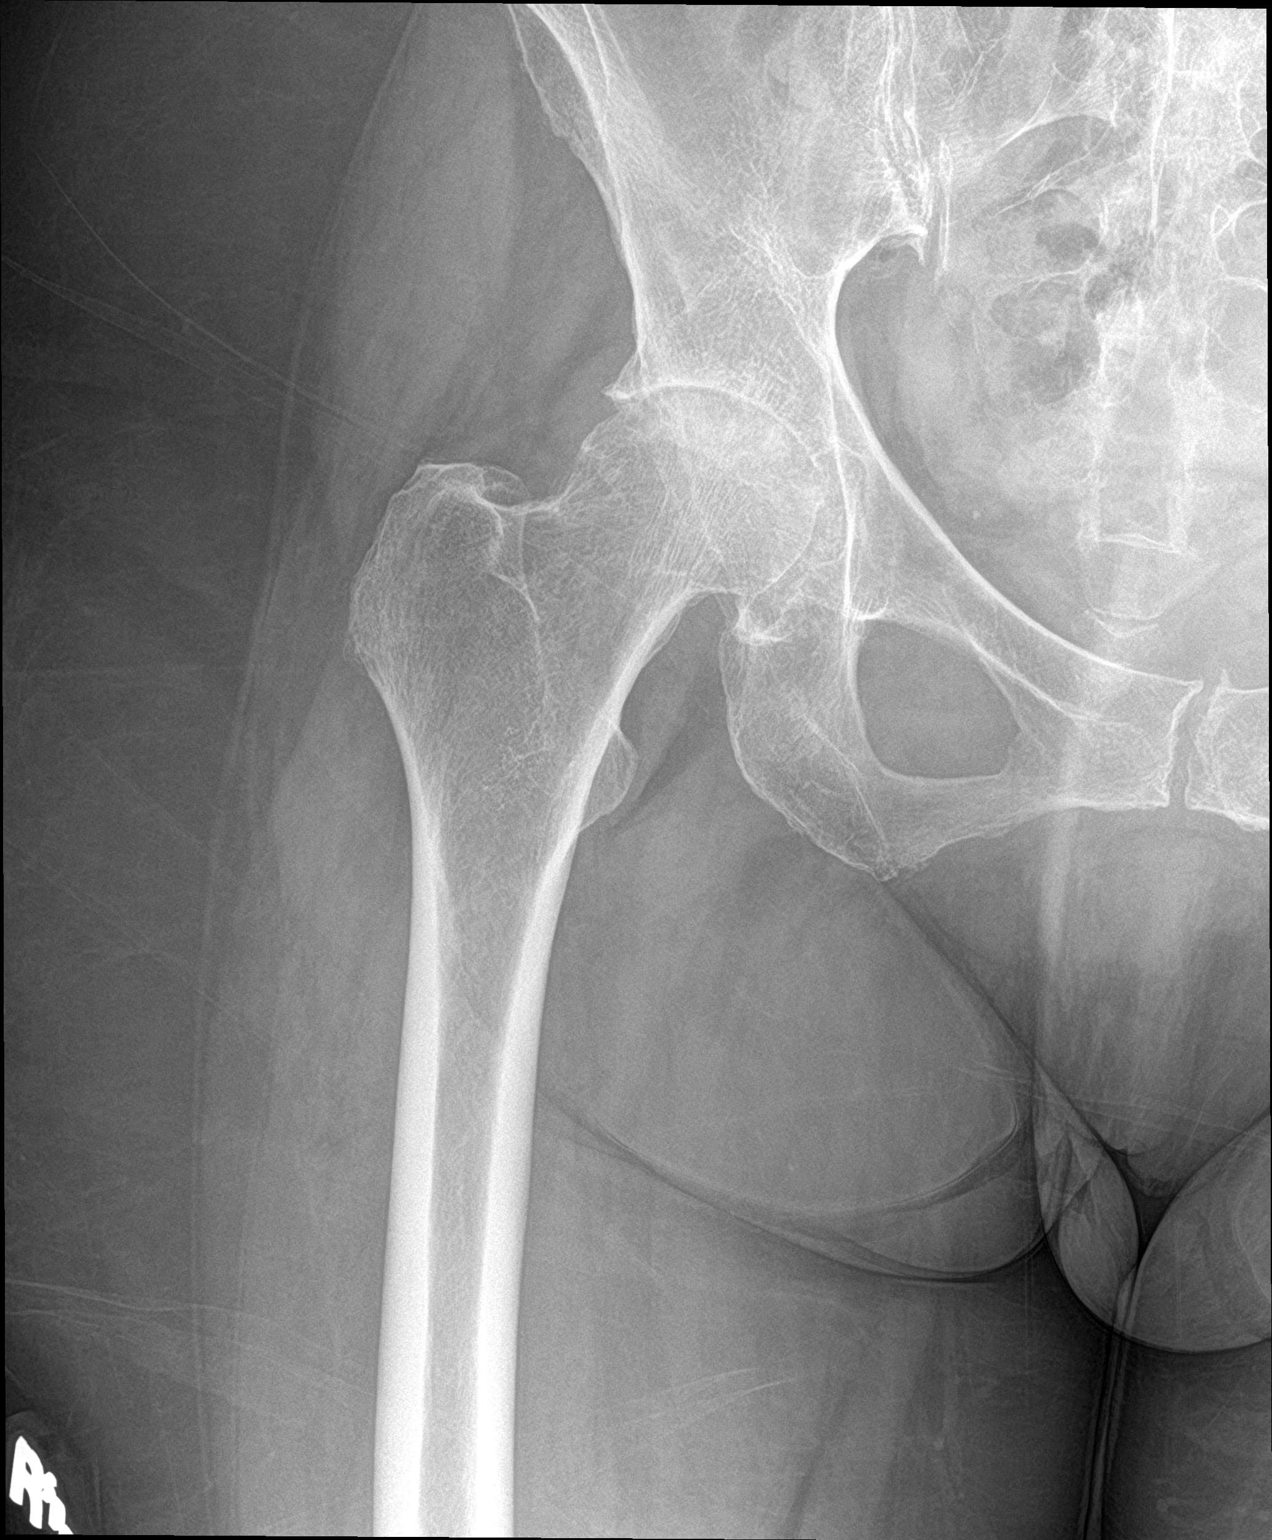

[hip lat]
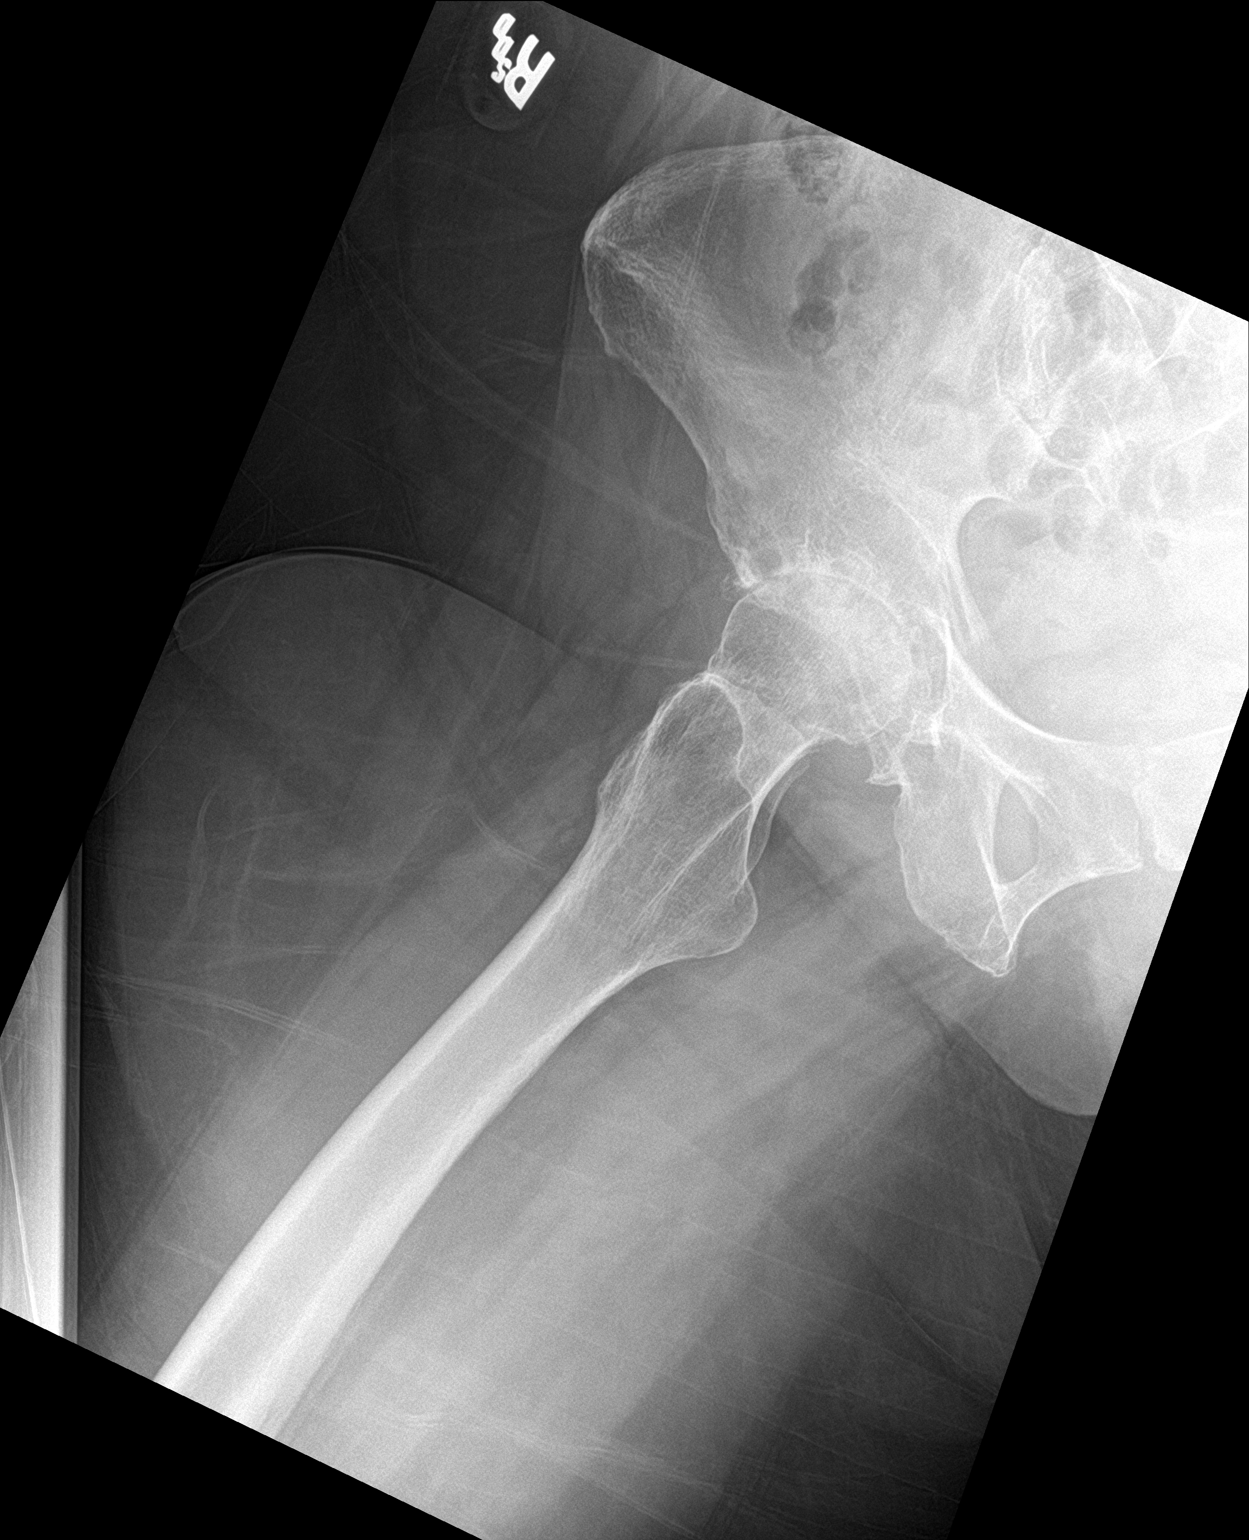

[3 of 3 positions shown; findings below may reference images not displayed]

FINDINGS: Pelvic ring is intact. Degenerative changes of the right hip joint
are noted. Some remodeling of the femoral head is seen. Curvilinear
lucency is noted in the femoral head superiorly could avascular
necrosis. No acute fracture is seen.
IMPRESSION: Degenerative changes of the right hip joint consistent with
avascular necrosis. No acute abnormality noted.

## 2021-09-19 ENCOUNTER — Other Ambulatory Visit: Payer: Self-pay | Admitting: Internal Medicine

## 2021-09-19 ENCOUNTER — Other Ambulatory Visit: Payer: Self-pay | Admitting: Family Medicine

## 2021-09-19 DIAGNOSIS — F4321 Adjustment disorder with depressed mood: Secondary | ICD-10-CM

## 2021-09-19 DIAGNOSIS — F5104 Psychophysiologic insomnia: Secondary | ICD-10-CM

## 2021-09-19 NOTE — Telephone Encounter (Signed)
Requested Prescriptions  ?Pending Prescriptions Disp Refills  ?? traZODone (DESYREL) 50 MG tablet [Pharmacy Med Name: traZODone HCl 50 MG Oral Tablet] 90 tablet 0  ?  Sig: TAKE 1 TABLET BY MOUTH AT BEDTIME  ?  ? Psychiatry: Antidepressants - Serotonin Modulator Passed - 09/19/2021  8:05 AM  ?  ?  Passed - Completed PHQ-2 or PHQ-9 in the last 360 days  ?  ?  Passed - Valid encounter within last 6 months  ?  Recent Outpatient Visits   ?      ? 4 months ago Encounter for general adult medical examination with abnormal findings  ? [facility] [place], [place] W, NP  ? 5 months ago Adjustment disorder with depressed mood  ? [place], DO  ? 9 months ago Acute gout involving toe of right foot, unspecified cause  ? [place], DO  ? 10 months ago Essential hypertension  ? [facility] [place], Coralie Keens, NP  ? 1 year ago Vertigo  ? [facility], Lupita Raider, FNP  ?  ?  ?Future Appointments   ?        ? In 1 month Baity, Coralie Keens, NP [facility] [place]  ?  ? ?  ?  ?  ? ?

## 2021-09-19 NOTE — Telephone Encounter (Signed)
Requested medication (s) are due for refill today: yes ? ?Requested medication (s) are on the active medication list: yes and no ? ?Last refill:  08/21/21 #30/0 for ambien ? ?Future visit scheduled: yes ? ?Notes to clinic:  Lorrin Mais is not delegated, nystatin rx was dc'd on 05/10/21. Please advise ? ? ?  ?Requested Prescriptions  ?Pending Prescriptions Disp Refills  ? zolpidem (AMBIEN) 5 MG tablet [Pharmacy Med Name: Zolpidem Tartrate 5 MG Oral Tablet] 30 tablet 0  ?  Sig: TAKE 1 TABLET BY MOUTH AT BEDTIME  ?  ? Not Delegated - Psychiatry:  Anxiolytics/Hypnotics Failed - 09/19/2021  8:06 AM  ?  ?  Failed - This refill cannot be delegated  ?  ?  Failed - Urine Drug Screen completed in last 360 days  ?  ?  Passed - Valid encounter within last 6 months  ?  Recent Outpatient Visits   ? ?      ? 4 months ago Encounter for general adult medical examination with abnormal findings  ? [facility] [place], [place], NP  ? 5 months ago Adjustment disorder with depressed mood  ? [place], DO  ? 9 months ago Acute gout involving toe of right foot, unspecified cause  ? [place], DO  ? 10 months ago Essential hypertension  ? [facility] [place], Coralie Keens, NP  ? 1 year ago Vertigo  ? [facility], Lupita Raider, FNP  ? ?  ?  ?Future Appointments   ? ?        ? In 1 month Baity, Coralie Keens, NP [place]  ? ?  ? ?  ?  ?  ? nystatin (MYCOSTATIN/NYSTOP) powder [Pharmacy Med Name: Nystatin 100000 UNIT/GM External Powder] 30 g 0  ?  Sig: APPLY POWDER TOPICALLY TO AFFECTED AREA THREE TIMES DAILY  ?  ? Off-Protocol Failed - 09/19/2021  8:06 AM  ?  ?  Failed - Medication not assigned to a protocol, review manually.  ?  ?  Passed - Valid encounter within last 12 months  ?  Recent Outpatient Visits   ? ?      ? 4 months ago Encounter for general adult medical examination with abnormal findings  ?  [facility] [place], [place] W, NP  ? 5 months ago Adjustment disorder with depressed mood  ? [place], DO  ? 9 months ago Acute gout involving toe of right foot, unspecified cause  ? [place], DO  ? 10 months ago Essential hypertension  ? [facility] [place], Coralie Keens, NP  ? 1 year ago Vertigo  ? [facility], Lupita Raider, FNP  ? ?  ?  ?Future Appointments   ? ?        ? In 1 month Baity, Coralie Keens, NP [facility] [place]  ? ?  ? ?  ?  ?  ? ?

## 2021-10-24 ENCOUNTER — Other Ambulatory Visit: Payer: Self-pay | Admitting: Internal Medicine

## 2021-10-24 DIAGNOSIS — F5104 Psychophysiologic insomnia: Secondary | ICD-10-CM

## 2021-10-25 NOTE — Telephone Encounter (Signed)
Requested medication (s) are due for refill today - yes ? ?Requested medication (s) are on the active medication list -yes ? ?Future visit scheduled -yes ? ?Last refill: 09/20/21 #30 ? ?Notes to clinic: non delegated Rx ? ?Requested Prescriptions  ?Pending Prescriptions Disp Refills  ? zolpidem (AMBIEN) 5 MG tablet [Pharmacy Med Name: Zolpidem Tartrate 5 MG Oral Tablet] 30 tablet 0  ?  Sig: TAKE 1 TABLET BY MOUTH AT BEDTIME  ?  ? Not Delegated - Psychiatry:  Anxiolytics/Hypnotics Failed - 10/24/2021  2:01 PM  ?  ?  Failed - This refill cannot be delegated  ?  ?  Failed - Urine Drug Screen completed in last 360 days  ?  ?  Passed - Valid encounter within last 6 months  ?  Recent Outpatient Visits   ? ?      ? 5 months ago Encounter for general adult medical examination with abnormal findings  ? [facility] [place], [place], NP  ? 7 months ago Adjustment disorder with depressed mood  ? [place], DO  ? 10 months ago Acute gout involving toe of right foot, unspecified cause  ? [place], DO  ? 1 year ago Essential hypertension  ? [facility] [place], Coralie Keens, NP  ? 1 year ago Vertigo  ? [facility], Lupita Raider, FNP  ? ?  ?  ?Future Appointments   ? ?        ? In 2 weeks Baity, Coralie Keens, NP [facility] [place]  ? ?  ? ? ?  ?  ?  ? ? ? ?Requested Prescriptions  ?Pending Prescriptions Disp Refills  ? zolpidem (AMBIEN) 5 MG tablet [Pharmacy Med Name: Zolpidem Tartrate 5 MG Oral Tablet] 30 tablet 0  ?  Sig: TAKE 1 TABLET BY MOUTH AT BEDTIME  ?  ? Not Delegated - Psychiatry:  Anxiolytics/Hypnotics Failed - 10/24/2021  2:01 PM  ?  ?  Failed - This refill cannot be delegated  ?  ?  Failed - Urine Drug Screen completed in last 360 days  ?  ?  Passed - Valid encounter within last 6 months  ?  Recent Outpatient Visits   ? ?      ? 5 months ago Encounter for general adult medical  examination with abnormal findings  ? [facility] [place], [place], NP  ? 7 months ago Adjustment disorder with depressed mood  ? [place], DO  ? 10 months ago Acute gout involving toe of right foot, unspecified cause  ? [place], DO  ? 1 year ago Essential hypertension  ? [facility] [place], Coralie Keens, NP  ? 1 year ago Vertigo  ? [facility], Lupita Raider, FNP  ? ?  ?  ?Future Appointments   ? ?        ? In 2 weeks Baity, Coralie Keens, NP [facility] [place]  ? ?  ? ? ?  ?  ?  ? ? ? ?

## 2021-11-09 ENCOUNTER — Ambulatory Visit: Payer: PPO | Admitting: Internal Medicine

## 2021-11-09 NOTE — Progress Notes (Deleted)
Subjective:    Patient ID: Linda Farley, female    DOB: [birth year], 71 y.o.   MRN: [id number]  HPI  Patient presents to clinic today for follow-up of chronic conditions.  HTN: Her BP today is.  She is taking Lisinopril and Furosemide as prescribed.  ECG from 12/2019 reviewed.  HLD status post Stroke: Her last LDL was 78, triglycerides 130, 10/2018.  She denies myalgias on Atorvastatin.  She is taking Aspirin as well.  She tries to consume low-fat diet.  Chronic DVT: She is taking Aspirin daily.  She follows with vascular.  Depression: Chronic, managed on Citalopram and Bupropion.  She is not currently seeing a therapist.  She denies anxiety, SI/HI.  OA: Mainly in her lower back and shoulders.  She takes Gabapentin and Hydrocodone as prescribed with good relief of symptoms.  She follows with pain management.  Prediabetes: Her last A1c was 5.8%, 10/2020.  She is not taking any oral diabetic medication at this time.  She does not check her sugars.  Asthma: Seasonal.  Managed with Albuterol as needed.  There are no PFTs on file.  RLS: Managed on Ropinirole.  She does not follow with neurology.  Gout:  Insomnia: She has difficulty.  She has failed Trazodone in the past.  She is taking Ambien as prescribed.  Sleep study from 07/2019 reviewed.  Review of Systems     Past Medical History:  Diagnosis Date   Allergy    Anxiety    Arthritis    Depression    Hypertension     Current Outpatient Medications  Medication Sig Dispense Refill   albuterol (VENTOLIN HFA) 108 (90 Base) MCG/ACT inhaler INHALE 2 PUFFS BY MOUTH EVERY 6 HOURS AS NEEDED FOR WHEEZING OR  SHORTNESS  OF  BREATH 9 g 3   buPROPion (WELLBUTRIN XL) 150 MG 24 hr tablet Take 1 tablet by mouth once daily 30 tablet 2   calcium carbonate (TUMS - DOSED IN MG ELEMENTAL CALCIUM) 500 MG chewable tablet Chew 1 tablet by mouth daily.     Cholecalciferol (D3 SUPER STRENGTH) 50 MCG (2000 UT) CAPS Take by mouth.     citalopram  (CELEXA) 20 MG tablet Take 1 tablet by mouth once daily 90 tablet 1   furosemide (LASIX) 20 MG tablet Take 1 tablet by mouth once daily 90 tablet 1   gabapentin (NEURONTIN) 300 MG capsule Take 1 capsule (300 mg total) by mouth as directed. One capsule in the morning and two capsules every day at bedtime 270 capsule 1   HYDROcodone-acetaminophen (NORCO) 7.5-325 MG tablet      lisinopril (ZESTRIL) 20 MG tablet Take 1 tablet by mouth once daily 90 tablet 1   rOPINIRole (REQUIP XL) 2 MG 24 hr tablet TAKE 1 TABLET BY MOUTH AT BEDTIME 90 tablet 2   traZODone (DESYREL) 50 MG tablet TAKE 1 TABLET BY MOUTH AT BEDTIME 90 tablet 0   zolpidem (AMBIEN) 5 MG tablet TAKE 1 TABLET BY MOUTH AT BEDTIME 30 tablet 0   No current facility-administered medications for this visit.    Allergies  Allergen Reactions   Chlorhexidine    Penicillins Rash    Family History  Problem Relation Age of Onset   Prostate cancer Father    Bone cancer Brother    Heart disease Brother     Social History   Socioeconomic History   Marital status: Married    Spouse name: Not on file   Number of children: Not on file  Years of education: Not on file   Highest education level: Not on file  Occupational History   Not on file  Tobacco Use   Smoking status: Never   Smokeless tobacco: Never  Vaping Use   Vaping Use: Never used  Substance and Sexual Activity   Alcohol use: Yes    Alcohol/week: 1.0 standard drink    Types: 1 Glasses of wine per week    Comment: 3 wine coolers weekly   Drug use: Never   Sexual activity: Not on file  Other Topics Concern   Not on file  Social History Narrative   Not on file   Social Determinants of Health   Financial Resource Strain: Not on file  Food Insecurity: Not on file  Transportation Needs: Not on file  Physical Activity: Not on file  Stress: Not on file  Social Connections: Not on file  Intimate Partner Violence: Not on file     Constitutional: Denies fever,  malaise, fatigue, headache or abrupt weight changes.  HEENT: Denies eye pain, eye redness, ear pain, ringing in the ears, wax buildup, runny nose, nasal congestion, bloody nose, or sore throat. Respiratory: Denies difficulty breathing, shortness of breath, cough or sputum production.   Cardiovascular: Denies chest pain, chest tightness, palpitations or swelling in the hands or feet.  Gastrointestinal: Denies abdominal pain, bloating, constipation, diarrhea or blood in the stool.  GU: Denies urgency, frequency, pain with urination, burning sensation, blood in urine, odor or discharge. Musculoskeletal: Patient reports chronic joint pain.  Denies decrease in range of motion, difficulty with gait, muscle pain or joint swelling.  Skin: Denies redness, rashes, lesions or ulcercations.  Neurological: Patient reports insomnia, restless legs.  Denies dizziness, difficulty with memory, difficulty with speech or problems with balance and coordination.  Psych: Patient has a history of depression.  Denies anxiety, SI/HI.  No other specific complaints in a complete review of systems (except as listed in HPI above).  Objective:   Physical Exam   There were no vitals taken for this visit. Wt Readings from Last 3 Encounters:  05/09/21 203 lb (92.1 kg)  03/24/21 205 lb 6.4 oz (93.2 kg)  12/07/20 203 lb (92.1 kg)    General: Appears their stated age, well developed, well nourished in NAD. Skin: Warm, dry and intact. No rashes, lesions or ulcerations noted. HEENT: Head: normal shape and size; Eyes: sclera white, no icterus, conjunctiva pink, PERRLA and EOMs intact; Ears: Tm's gray and intact, normal light reflex; Nose: mucosa pink and moist, septum midline; Throat/Mouth: Teeth present, mucosa pink and moist, no exudate, lesions or ulcerations noted.  Neck:  Neck supple, trachea midline. No masses, lumps or thyromegaly present.  Cardiovascular: Normal rate and rhythm. S1,S2 noted.  No murmur, rubs or gallops  noted. No JVD or BLE edema. No carotid bruits noted. Pulmonary/Chest: Normal effort and positive vesicular breath sounds. No respiratory distress. No wheezes, rales or ronchi noted.  Abdomen: Soft and nontender. Normal bowel sounds. No distention or masses noted. Liver, spleen and kidneys non palpable. Musculoskeletal: Normal range of motion. No signs of joint swelling. No difficulty with gait.  Neurological: Alert and oriented. Cranial nerves II-XII grossly intact. Coordination normal.  Psychiatric: Mood and affect normal. Behavior is normal. Judgment and thought content normal.    BMET    Component Value Date/Time   NA 141 11/01/2020 0755   K 4.2 11/01/2020 0755   CL 103 11/01/2020 0755   CO2 32 11/01/2020 0755   GLUCOSE 77 11/01/2020 0755  BUN 24 11/01/2020 0755   CREATININE 1.06 (H) 11/01/2020 0755   CALCIUM 9.3 11/01/2020 0755   GFRNONAA >60 12/23/2019 0508   GFRAA >60 12/23/2019 0508    Lipid Panel     Component Value Date/Time   CHOL 154 11/01/2020 0755   TRIG 130 11/01/2020 0755   HDL 55 11/01/2020 0755   CHOLHDL 2.8 11/01/2020 0755   VLDL 16 12/19/2019 0552   LDLCALC 78 11/01/2020 0755    CBC    Component Value Date/Time   WBC 6.9 11/01/2020 0755   RBC 4.41 11/01/2020 0755   HGB 13.5 11/01/2020 0755   HCT 40.7 11/01/2020 0755   PLT 191 11/01/2020 0755   MCV 92.3 11/01/2020 0755   MCH 30.6 11/01/2020 0755   MCHC 33.2 11/01/2020 0755   RDW 13.3 11/01/2020 0755   LYMPHSABS 2,651 09/03/2019 0928   EOSABS 74 09/03/2019 0928   BASOSABS 74 09/03/2019 0928    Hgb A1C Lab Results  Component Value Date   HGBA1C 5.8 (H) 11/01/2020           Assessment & Plan:    Webb Silversmith, NP

## 2021-11-23 ENCOUNTER — Other Ambulatory Visit: Payer: Self-pay | Admitting: Internal Medicine

## 2021-11-23 DIAGNOSIS — F5104 Psychophysiologic insomnia: Secondary | ICD-10-CM

## 2021-11-23 NOTE — Telephone Encounter (Signed)
Requested medication (s) are due for refill today: Yes  Requested medication (s) are on the active medication list: Yes  Last refill:  10/24/21  Future visit scheduled: No  Notes to clinic:  See request.    Requested Prescriptions  Pending Prescriptions Disp Refills   zolpidem (AMBIEN) 5 MG tablet [Pharmacy Med Name: Zolpidem Tartrate 5 MG Oral Tablet] 30 tablet 0    Sig: TAKE 1 TABLET BY MOUTH AT BEDTIME     Not Delegated - Psychiatry:  Anxiolytics/Hypnotics Failed - 11/23/2021 10:06 AM      Failed - This refill cannot be delegated      Failed - Urine Drug Screen completed in last 360 days      Failed - Valid encounter within last 6 months    Recent Outpatient Visits           6 months ago Encounter for general adult medical examination with abnormal findings   [facility] [place], Coralie Keens, NP   8 months ago Adjustment disorder with depressed mood   [facility] [place], Devonne Doughty, DO   11 months ago Acute gout involving toe of right foot, unspecified cause   [place], Devonne Doughty, DO   1 year ago Essential hypertension   [place], Coralie Keens, NP   1 year ago [facility] [place], Lupita Raider, [place]              Signed Prescriptions Disp Refills   rOPINIRole (REQUIP XL) 2 MG 24 hr tablet 90 tablet 0    Sig: TAKE 1 TABLET BY MOUTH AT BEDTIME     Neurology:  Parkinsonian Agents Failed - 11/23/2021 10:06 AM      Failed - Last BP in normal range    BP Readings from Last 1 Encounters:  05/09/21 (!) 119/99         Passed - Last Heart Rate in normal range    Pulse Readings from Last 1 Encounters:  05/09/21 80         Passed - Valid encounter within last 12 months    Recent Outpatient Visits           6 months ago Encounter for general adult medical examination with abnormal findings   [facility] [place], Coralie Keens, NP   8 months ago  Adjustment disorder with depressed mood   [place], DO   11 months ago Acute gout involving toe of right foot, unspecified cause   [place], Devonne Doughty, DO   1 year ago Essential hypertension   [facility] [place], Coralie Keens, NP   1 year ago [facility] [place], Lupita Raider, [place]

## 2021-11-23 NOTE — Telephone Encounter (Signed)
Requested Prescriptions  Pending Prescriptions Disp Refills  . zolpidem (AMBIEN) 5 MG tablet [Pharmacy Med Name: Zolpidem Tartrate 5 MG Oral Tablet] 30 tablet 0    Sig: TAKE 1 TABLET BY MOUTH AT BEDTIME     Not Delegated - Psychiatry:  Anxiolytics/Hypnotics Failed - 11/23/2021 10:06 AM      Failed - This refill cannot be delegated      Failed - Urine Drug Screen completed in last 360 days      Failed - Valid encounter within last 6 months    Recent Outpatient Visits          6 months ago Encounter for general adult medical examination with abnormal findings   [facility] [place], Coralie Keens, NP   8 months ago Adjustment disorder with depressed mood   [facility] [place], DO   11 months ago Acute gout involving toe of right foot, unspecified cause   [place], Devonne Doughty, DO   1 year ago Essential hypertension   [place], Coralie Keens, NP   1 year ago [facility] [place], Lupita Raider, FNP             . rOPINIRole (REQUIP XL) 2 MG 24 hr tablet [Pharmacy Med Name: rOPINIRole HCl ER 2 MG Oral Tablet Extended Release 24 Hour] 90 tablet 0    Sig: TAKE 1 TABLET BY MOUTH AT BEDTIME     Neurology:  Parkinsonian Agents Failed - 11/23/2021 10:06 AM      Failed - Last BP in normal range    BP Readings from Last 1 Encounters:  05/09/21 (!) 119/99         Passed - Last Heart Rate in normal range    Pulse Readings from Last 1 Encounters:  05/09/21 80         Passed - Valid encounter within last 12 months    Recent Outpatient Visits          6 months ago Encounter for general adult medical examination with abnormal findings   [facility] [place], Coralie Keens, NP   8 months ago Adjustment disorder with depressed mood   [place], DO   11 months ago Acute gout involving toe of right foot, unspecified cause   [place], Devonne Doughty, DO   1 year ago Essential hypertension   [facility] [place], Coralie Keens, NP   1 year ago [facility] [place], Lupita Raider, [place]

## 2021-12-14 ENCOUNTER — Other Ambulatory Visit: Payer: Self-pay | Admitting: Internal Medicine

## 2021-12-14 NOTE — Telephone Encounter (Signed)
Requested medications are due for refill today.  Wellbutrin yes, Nystop unsure  Requested medications are on the active medications list.  Nystop no, Wellbutrin yes  Last refill. Nystop 02/21/2021, Wellbutrin 08/21/2021 #30 2 refill  Future visit scheduled.   no  Notes to clinic.  Labs are expired. Nystop was discontinued      Requested Prescriptions  Pending Prescriptions Disp Refills   buPROPion (WELLBUTRIN XL) 150 MG 24 hr tablet [Pharmacy Med Name: buPROPion HCl ER (XL) 150 MG Oral Tablet Extended Release 24 Hour] 30 tablet 0    Sig: Take 1 tablet by mouth once daily     Psychiatry: Antidepressants - bupropion Failed - 12/14/2021  9:53 AM      Failed - Cr in normal range and within 360 days    Creat  Date Value Ref Range Status  11/01/2020 1.06 (H) 0.60 - 0.93 mg/dL Final    Comment:    For patients >24 years of age, the reference limit for Creatinine is approximately 13% higher for people identified as African-American. .          Failed - AST in normal range and within 360 days    AST  Date Value Ref Range Status  11/01/2020 15 10 - 35 U/L Final         Failed - ALT in normal range and within 360 days    ALT  Date Value Ref Range Status  11/01/2020 10 6 - 29 U/L Final         Failed - Last BP in normal range    BP Readings from Last 1 Encounters:  05/09/21 (!) 119/99         Failed - Valid encounter within last 6 months    Recent Outpatient Visits           7 months ago Encounter for general adult medical examination with abnormal findings   [facility] [place], Coralie Keens, NP   8 months ago Adjustment disorder with depressed mood   [place], DO   1 year ago Acute gout involving toe of right foot, unspecified cause   [place], Devonne Doughty, DO   1 year ago Essential hypertension   [place], Coralie Keens, NP   1 year ago [facility] [place], Lupita Raider, [place]              Passed - Completed PHQ-2 or PHQ-9 in the last 360 days       NYSTATIN powder [Pharmacy Med Name: Nystop 100000 UNIT/GM External Powder] 30 g 0    Sig: APPLY [place]     Off-Protocol Failed - 12/14/2021  9:53 AM      Failed - Medication not assigned to a protocol, review manually.      Passed - Valid encounter within last 12 months    Recent Outpatient Visits           7 months ago Encounter for general adult medical examination with abnormal findings   [facility] [place], Coralie Keens, NP   8 months ago Adjustment disorder with depressed mood   [place], DO   1 year ago Acute gout involving toe of right foot, unspecified cause   [place], Devonne Doughty, DO   1 year ago Essential hypertension  [facility] [place], Coralie Keens, NP   1 year ago [facility] [place], Lupita Raider, [place]

## 2021-12-19 ENCOUNTER — Other Ambulatory Visit: Payer: Self-pay | Admitting: Internal Medicine

## 2021-12-19 DIAGNOSIS — F5104 Psychophysiologic insomnia: Secondary | ICD-10-CM

## 2021-12-20 ENCOUNTER — Other Ambulatory Visit: Payer: Self-pay | Admitting: Internal Medicine

## 2021-12-20 DIAGNOSIS — F4321 Adjustment disorder with depressed mood: Secondary | ICD-10-CM

## 2021-12-20 DIAGNOSIS — J45998 Other asthma: Secondary | ICD-10-CM

## 2021-12-20 DIAGNOSIS — F5104 Psychophysiologic insomnia: Secondary | ICD-10-CM

## 2021-12-20 NOTE — Telephone Encounter (Signed)
Requested Prescriptions  Pending Prescriptions Disp Refills  . albuterol (VENTOLIN HFA) 108 (90 Base) MCG/ACT inhaler [Pharmacy Med Name: Albuterol Sulfate HFA 108 (90 Base) MCG/ACT Inhalation Aerosol Solution] 9 g 2    Sig: INHALE 2 PUFFS BY MOUTH EVERY 6 HOURS AS NEEDED FOR WHEEZING OR SHORTNESS OF BREATH     Pulmonology:  Beta Agonists 2 Failed - 12/20/2021  2:28 PM      Failed - Last BP in normal range    BP Readings from Last 1 Encounters:  05/09/21 (!) 119/99         Passed - Last Heart Rate in normal range    Pulse Readings from Last 1 Encounters:  05/09/21 80         Passed - Valid encounter within last 12 months    Recent Outpatient Visits          7 months ago Encounter for general adult medical examination with abnormal findings   [facility] [place], Coralie Keens, NP   9 months ago Adjustment disorder with depressed mood   [place], DO   1 year ago Acute gout involving toe of right foot, unspecified cause   [place], Devonne Doughty, DO   1 year ago Essential hypertension   [facility] [place], Coralie Keens, NP   1 year ago [facility] [place], Lupita Raider, [place]

## 2021-12-20 NOTE — Telephone Encounter (Signed)
Courtesy refill. Pt is due for OV.  Requested Prescriptions  Pending Prescriptions Disp Refills  . traZODone (DESYREL) 50 MG tablet [Pharmacy Med Name: traZODone HCl 50 MG Oral Tablet] 90 tablet 0    Sig: TAKE 1 TABLET BY MOUTH AT BEDTIME     Psychiatry: Antidepressants - Serotonin Modulator Failed - 12/20/2021  6:50 AM      Failed - Valid encounter within last 6 months    Recent Outpatient Visits          7 months ago Encounter for general adult medical examination with abnormal findings   [facility] [place], Coralie Keens, NP   9 months ago Adjustment disorder with depressed mood   [place], DO   1 year ago Acute gout involving toe of right foot, unspecified cause   [facility] [place] Haus[place], DO   1 year ago Essential hypertension   [place], Coralie Keens, NP   1 year ago [facility] [place], Lupita Raider, [place]             Passed - Completed PHQ-2 or PHQ-9 in the last 360 days

## 2021-12-20 NOTE — Telephone Encounter (Signed)
Pt called, unable to LVM d/t VM full. Pt is due for OV for medication refill.

## 2021-12-20 NOTE — Telephone Encounter (Signed)
Requested medication (s) are due for refill today: yes  Requested medication (s) are on the active medication list: yes  Last refill:  11/23/21  Future visit scheduled: no  Notes to clinic:  Unable to refill per protocol, cannot delegate. Appointment needed.      Requested Prescriptions  Pending Prescriptions Disp Refills   zolpidem (AMBIEN) 5 MG tablet [Pharmacy Med Name: Zolpidem Tartrate 5 MG Oral Tablet] 30 tablet 0    Sig: TAKE 1 TABLET BY MOUTH AT BEDTIME     Not Delegated - Psychiatry:  Anxiolytics/Hypnotics Failed - 12/19/2021  9:22 AM      Failed - This refill cannot be delegated      Failed - Urine Drug Screen completed in last 360 days      Failed - Valid encounter within last 6 months    Recent Outpatient Visits           7 months ago Encounter for general adult medical examination with abnormal findings   [facility] [place], Coralie Keens, NP   9 months ago Adjustment disorder with depressed mood   [place], DO   1 year ago Acute gout involving toe of right foot, unspecified cause   [place], Devonne Doughty, DO   1 year ago Essential hypertension   [facility] [place], Coralie Keens, NP   1 year ago [facility] [place], Lupita Raider, [place]

## 2021-12-22 ENCOUNTER — Other Ambulatory Visit: Payer: Self-pay

## 2022-01-09 ENCOUNTER — Ambulatory Visit (INDEPENDENT_AMBULATORY_CARE_PROVIDER_SITE_OTHER): Payer: PPO | Admitting: Internal Medicine

## 2022-01-09 ENCOUNTER — Encounter: Payer: Self-pay | Admitting: Internal Medicine

## 2022-01-09 VITALS — BP 122/70 | HR 60 | Temp 98.2°F | Wt 208.0 lb

## 2022-01-09 DIAGNOSIS — E6609 Other obesity due to excess calories: Secondary | ICD-10-CM

## 2022-01-09 DIAGNOSIS — K219 Gastro-esophageal reflux disease without esophagitis: Secondary | ICD-10-CM

## 2022-01-09 DIAGNOSIS — I10 Essential (primary) hypertension: Secondary | ICD-10-CM | POA: Diagnosis not present

## 2022-01-09 DIAGNOSIS — G2581 Restless legs syndrome: Secondary | ICD-10-CM

## 2022-01-09 DIAGNOSIS — Z6834 Body mass index (BMI) 34.0-34.9, adult: Secondary | ICD-10-CM

## 2022-01-09 DIAGNOSIS — F329 Major depressive disorder, single episode, unspecified: Secondary | ICD-10-CM

## 2022-01-09 DIAGNOSIS — R7303 Prediabetes: Secondary | ICD-10-CM

## 2022-01-09 DIAGNOSIS — E782 Mixed hyperlipidemia: Secondary | ICD-10-CM

## 2022-01-09 DIAGNOSIS — I82561 Chronic embolism and thrombosis of right calf muscular vein: Secondary | ICD-10-CM

## 2022-01-09 DIAGNOSIS — F5104 Psychophysiologic insomnia: Secondary | ICD-10-CM

## 2022-01-09 DIAGNOSIS — J45998 Other asthma: Secondary | ICD-10-CM

## 2022-01-09 DIAGNOSIS — M159 Polyosteoarthritis, unspecified: Secondary | ICD-10-CM

## 2022-01-09 DIAGNOSIS — Z8673 Personal history of transient ischemic attack (TIA), and cerebral infarction without residual deficits: Secondary | ICD-10-CM

## 2022-01-09 MED ORDER — BUPROPION HCL ER (XL) 150 MG PO TB24: 150.0000 mg | ORAL_TABLET | Freq: Every day | ORAL | 1 refills | Status: DC

## 2022-01-09 MED ORDER — ROPINIROLE HCL ER 2 MG PO TB24
2.0000 mg | ORAL_TABLET | Freq: Every day | ORAL | 1 refills | Status: DC
Start: 2022-01-09 — End: 2022-08-07

## 2022-01-09 MED ORDER — OMEPRAZOLE 20 MG PO CPDR: 20.0000 mg | DELAYED_RELEASE_CAPSULE | Freq: Every day | ORAL | 1 refills | Status: DC

## 2022-01-09 MED ORDER — NYSTATIN 100000 UNIT/GM EX POWD: 1.0000 | Freq: Three times a day (TID) | CUTANEOUS | 1 refills | Status: DC

## 2022-01-09 NOTE — Assessment & Plan Note (Signed)
Continue citalopram We will restart buspirone Support offered

## 2022-01-09 NOTE — Assessment & Plan Note (Signed)
We will trial omeprazole

## 2022-01-09 NOTE — Assessment & Plan Note (Signed)
Encouraged diet and exercise for weight loss ?

## 2022-01-09 NOTE — Assessment & Plan Note (Signed)
C-Met and lipid profile today Encouraged her to consume a low-fat diet She declines statin therapy

## 2022-01-09 NOTE — Assessment & Plan Note (Signed)
C-Met and lipid profile today Encouraged her to consume low-fat diet She declines statin therapy Continue aspirin

## 2022-01-09 NOTE — Assessment & Plan Note (Signed)
Controlled on lisinopril and furosemide Reinforced DASH diet and exercise weight loss C-Met today 

## 2022-01-09 NOTE — Assessment & Plan Note (Signed)
Managed with gabapentin and hydrocodone per pain management

## 2022-01-09 NOTE — Assessment & Plan Note (Signed)
Continue ropinirole 

## 2022-01-09 NOTE — Patient Instructions (Signed)
Heart-Healthy Eating Plan Heart-healthy meal planning includes: Eating less unhealthy fats. Eating more healthy fats. Making other changes in your diet. Talk with your doctor or a diet specialist (dietitian) to create an eating plan that is right for you. What is my plan? Your doctor may recommend an eating plan that includes: Total fat: ______% or less of total calories a day. Saturated fat: ______% or less of total calories a day. Cholesterol: less than _________mg a day. What are tips for following this plan? Cooking Avoid frying your food. Try to bake, boil, grill, or broil it instead. You can also reduce fat by: Removing the skin from poultry. Removing all visible fats from meats. Steaming vegetables in water or broth. Meal planning  At meals, divide your plate into four equal parts: Fill one-half of your plate with vegetables and green salads. Fill one-fourth of your plate with whole grains. Fill one-fourth of your plate with lean protein foods. Eat 4-5 servings of vegetables per day. A serving of vegetables is: 1 cup of raw or cooked vegetables. 2 cups of raw leafy greens. Eat 4-5 servings of fruit per day. A serving of fruit is: 1 medium whole fruit.  cup of dried fruit.  cup of fresh, frozen, or canned fruit.  cup of 100% fruit juice. Eat more foods that have soluble fiber. These are apples, broccoli, carrots, beans, peas, and barley. Try to get 20-30 g of fiber per day. Eat 4-5 servings of nuts, legumes, and seeds per week: 1 serving of dried beans or legumes equals  cup after being cooked. 1 serving of nuts is  cup. 1 serving of seeds equals 1 tablespoon. General information Eat more home-cooked food. Eat less restaurant, buffet, and fast food. Limit or avoid alcohol. Limit foods that are high in starch and sugar. Avoid fried foods. Lose weight if you are overweight. Keep track of how much salt (sodium) you eat. This is important if you have high blood  pressure. Ask your doctor to tell you more about this. Try to add vegetarian meals each week. Fats Choose healthy fats. These include olive oil and canola oil, flaxseeds, walnuts, almonds, and seeds. Eat more omega-3 fats. These include salmon, mackerel, sardines, tuna, flaxseed oil, and ground flaxseeds. Try to eat fish at least 2 times each week. Check food labels. Avoid foods with trans fats or high amounts of saturated fat. Limit saturated fats. These are often found in animal products, such as meats, butter, and cream. These are also found in plant foods, such as palm oil, palm kernel oil, and coconut oil. Avoid foods with partially hydrogenated oils in them. These have trans fats. Examples are stick margarine, some tub margarines, cookies, crackers, and other baked goods. What foods can I eat? Fruits All fresh, canned (in natural juice), or frozen fruits. Vegetables Fresh or frozen vegetables (raw, steamed, roasted, or grilled). Green salads. Grains Most grains. Choose whole wheat and whole grains most of the time. Rice and pasta, including brown rice and pastas made with whole wheat. Meats and other proteins Lean, well-trimmed beef, veal, pork, and lamb. Chicken and turkey without skin. All fish and shellfish. Wild duck, rabbit, pheasant, and venison. Egg whites or low-cholesterol egg substitutes. Dried beans, peas, lentils, and tofu. Seeds and most nuts. Dairy Low-fat or nonfat cheeses, including ricotta and mozzarella. Skim or 1% milk that is liquid, powdered, or evaporated. Buttermilk that is made with low-fat milk. Nonfat or low-fat yogurt. Fats and oils Non-hydrogenated (trans-free) margarines. Vegetable oils, including   soybean, sesame, sunflower, olive, peanut, safflower, corn, canola, and cottonseed. Salad dressings or mayonnaise made with a vegetable oil. Beverages Mineral water. Coffee and tea. Diet carbonated beverages. Sweets and desserts Sherbet, gelatin, and fruit ice.  Small amounts of dark chocolate. Limit all sweets and desserts. Seasonings and condiments All seasonings and condiments. The items listed above may not be a complete list of foods and drinks you can eat. Contact a dietitian for more options. What foods should I avoid? Fruits Canned fruit in heavy syrup. Fruit in cream or butter sauce. Fried fruit. Limit coconut. Vegetables Vegetables cooked in cheese, cream, or butter sauce. Fried vegetables. Grains Breads that are made with saturated or trans fats, oils, or whole milk. Croissants. Sweet rolls. Donuts. High-fat crackers, such as cheese crackers. Meats and other proteins Fatty meats, such as hot dogs, ribs, sausage, bacon, rib-eye roast or steak. High-fat deli meats, such as salami and bologna. Caviar. Domestic duck and goose. Organ meats, such as liver. Dairy Cream, sour cream, cream cheese, and creamed cottage cheese. Whole-milk cheeses. Whole or 2% milk that is liquid, evaporated, or condensed. Whole buttermilk. Cream sauce or high-fat cheese sauce. Yogurt that is made from whole milk. Fats and oils Meat fat, or shortening. Cocoa butter, hydrogenated oils, palm oil, coconut oil, palm kernel oil. Solid fats and shortenings, including bacon fat, salt pork, lard, and butter. Nondairy cream substitutes. Salad dressings with cheese or sour cream. Beverages Regular sodas and juice drinks with added sugar. Sweets and desserts Frosting. Pudding. Cookies. Cakes. Pies. Milk chocolate or white chocolate. Buttered syrups. Full-fat ice cream or ice cream drinks. The items listed above may not be a complete list of foods and drinks to avoid. Contact a dietitian for more information. Summary Heart-healthy meal planning includes eating less unhealthy fats, eating more healthy fats, and making other changes in your diet. Eat a balanced diet. This includes fruits and vegetables, low-fat or nonfat dairy, lean protein, nuts and legumes, whole grains, and  heart-healthy oils and fats. This information is not intended to replace advice given to you by your health care provider. Make sure you discuss any questions you have with your health care provider. Document Revised: 10/06/2020 Document Reviewed: 10/06/2020 Elsevier Patient Education  2022 Elsevier Inc.  

## 2022-01-09 NOTE — Assessment & Plan Note (Signed)
Continue trazodone and Ambien She has failed previous wean

## 2022-01-09 NOTE — Assessment & Plan Note (Addendum)
Continue aspirin She will continue to follow with vascular

## 2022-01-09 NOTE — Assessment & Plan Note (Signed)
Continue albuterol as needed 

## 2022-01-09 NOTE — Assessment & Plan Note (Signed)
A1c today Encouraged her to consume a low carb diet and exercise for weight loss

## 2022-01-09 NOTE — Progress Notes (Signed)
Subjective:    Patient ID: Linda Farley, female    DOB: [birth year], 71 y.o.   MRN: [id number]  HPI  Patient presents to clinic today for follow-up of chronic conditions.  HTN: Her BP today is 122/70.  She is taking Lisinopril and Furosemide as prescribed.  ECG from 12/2019 reviewed.  HLD status post Stroke: Her last LDL was 78, triglycerides 130, 10/2020.  She is not taking any cholesterol lowering medication at this time.  She is taking Aspirin as well.  She tries to consume a low-fat diet.  She does not follow with neurology.  Chronic DVT: She is taking a baby Aspirin daily.  She follows with vascular.  Depression: Chronic, managed on Citalopram. She ran out of her Buspirone and has been feeling more anxious lately.  She is not currently seeing a therapist.  She denies anxiety, SI/HI.  OA: Mainly in her lower back and shoulders.  She takes Gabapentin and Hydrocodone as prescribed with some relief of symptoms.  She follows with pain management.  GERD: She is not sure what triggers this. She is not taking any medication for this. There is no upper GI on file.   Prediabetes: Her last A1c was 5.8%, 10/2020.  She did she is not taking any oral diabetic medication at this time.  She does not check her sugars.  Asthma: Seasonal.  Managed with Albuterol as needed.  There are no PFTs on file.  She does not follow with pulmonology or allergy  RLS: Managed with Gabapentin and Ropinirole.  She does not follow with neurology.  Insomnia. She has difficulty. She is taking Trazodone and Ambien as prescribed. Sleep study from 07/2019 reviewed.  Review of Systems     Past Medical History:  Diagnosis Date   Allergy    Anxiety    Arthritis    Depression    Hypertension     Current Outpatient Medications  Medication Sig Dispense Refill   albuterol (VENTOLIN HFA) 108 (90 Base) MCG/ACT inhaler INHALE 2 PUFFS BY MOUTH EVERY 6 HOURS AS NEEDED FOR WHEEZING OR SHORTNESS OF BREATH 9 g 2   buPROPion  (WELLBUTRIN XL) 150 MG 24 hr tablet Take 1 tablet by mouth once daily 30 tablet 2   calcium carbonate (TUMS - DOSED IN MG ELEMENTAL CALCIUM) 500 MG chewable tablet Chew 1 tablet by mouth daily.     Cholecalciferol (D3 SUPER STRENGTH) 50 MCG (2000 UT) CAPS Take by mouth.     citalopram (CELEXA) 20 MG tablet Take 1 tablet by mouth once daily 90 tablet 1   furosemide (LASIX) 20 MG tablet Take 1 tablet by mouth once daily 90 tablet 1   gabapentin (NEURONTIN) 300 MG capsule Take 1 capsule (300 mg total) by mouth as directed. One capsule in the morning and two capsules every day at bedtime 270 capsule 1   HYDROcodone-acetaminophen (NORCO) 7.5-325 MG tablet      lisinopril (ZESTRIL) 20 MG tablet Take 1 tablet by mouth once daily 90 tablet 1   rOPINIRole (REQUIP XL) 2 MG 24 hr tablet TAKE 1 TABLET BY MOUTH AT BEDTIME 90 tablet 0   traZODone (DESYREL) 50 MG tablet TAKE 1 TABLET BY MOUTH AT BEDTIME 30 tablet 0   zolpidem (AMBIEN) 5 MG tablet TAKE 1 TABLET BY MOUTH AT BEDTIME 30 tablet 0   No current facility-administered medications for this visit.    Allergies  Allergen Reactions   Chlorhexidine    Penicillins Rash    Family History  Problem Relation Age of Onset   Prostate cancer Father    Bone cancer Brother    Heart disease Brother     Social History   Socioeconomic History   Marital status: Married    Spouse name: Not on file   Number of children: Not on file   Years of education: Not on file   Highest education level: Not on file  Occupational History   Not on file  Tobacco Use   Smoking status: Never   Smokeless tobacco: Never  Vaping Use   Vaping Use: Never used  Substance and Sexual Activity   Alcohol use: Yes    Alcohol/week: 1.0 standard drink of alcohol    Types: 1 Glasses of wine per week    Comment: 3 wine coolers weekly   Drug use: Never   Sexual activity: Not on file  Other Topics Concern   Not on file  Social History Narrative   Not on file   Social  Determinants of Health   Financial Resource Strain: Not on file  Food Insecurity: Not on file  Transportation Needs: Not on file  Physical Activity: Not on file  Stress: Not on file  Social Connections: Not on file  Intimate Partner Violence: Not on file     Constitutional: Denies fever, malaise, fatigue, headache or abrupt weight changes.  HEENT: Denies eye pain, eye redness, ear pain, ringing in the ears, wax buildup, runny nose, nasal congestion, bloody nose, or sore throat. Respiratory: Denies difficulty breathing, shortness of breath, cough or sputum production.   Cardiovascular: Denies chest pain, chest tightness, palpitations or swelling in the hands or feet.  Gastrointestinal: Pt reports reflux. Denies abdominal pain, bloating, constipation, diarrhea or blood in the stool.  GU: Denies urgency, frequency, pain with urination, burning sensation, blood in urine, odor or discharge. Musculoskeletal: Pt reports joint pain in knees, shoulders. Denies decrease in range of motion, difficulty with gait, muscle pain or joint swelling.  Skin: Denies redness, rashes, lesions or ulcercations.  Neurological: Pt reports restless legs, insomnia. Denies dizziness, difficulty with memory, difficulty with speech or problems with balance and coordination.  Psych: Pt reports depression. Denies anxiety, SI/HI.  No other specific complaints in a complete review of systems (except as listed in HPI above).  Objective:   Physical Exam  BP 122/70 (BP Location: Right Arm, Patient Position: Sitting, Cuff Size: Large)   Pulse 60   Temp 98.2 F (36.8 C) (Temporal)   Wt 208 lb (94.3 kg)   SpO2 99%   BMI 34.61 kg/m   Wt Readings from Last 3 Encounters:  05/09/21 203 lb (92.1 kg)  03/24/21 205 lb 6.4 oz (93.2 kg)  12/07/20 203 lb (92.1 kg)    General: Appears her stated age, obese, in NAD. Skin: Warm, dry and intact.  HEENT: Head: normal shape and size; Eyes: sclera white, no icterus, conjunctiva  pink, PERRLA and EOMs intact;  Cardiovascular: Normal rate and rhythm. S1,S2 noted.  No murmur, rubs or gallops noted. No JVD or BLE edema. No carotid bruits noted. Pulmonary/Chest: Normal effort and positive vesicular breath sounds. No respiratory distress. No wheezes, rales or ronchi noted.  Abdomen: Soft and nontender. Normal bowel sounds. Musculoskeletal: No difficulty with gait.  Neurological: Alert and oriented.  Psychiatric: Mood and affect normal.  Anxious appearing.  Judgment and thought content normal.    BMET    Component Value Date/Time   NA 141 11/01/2020 0755   K 4.2 11/01/2020 0755   CL 103  11/01/2020 0755   CO2 32 11/01/2020 0755   GLUCOSE 77 11/01/2020 0755   BUN 24 11/01/2020 0755   CREATININE 1.06 (H) 11/01/2020 0755   CALCIUM 9.3 11/01/2020 0755   GFRNONAA >60 12/23/2019 0508   GFRAA >60 12/23/2019 0508    Lipid Panel     Component Value Date/Time   CHOL 154 11/01/2020 0755   TRIG 130 11/01/2020 0755   HDL 55 11/01/2020 0755   CHOLHDL 2.8 11/01/2020 0755   VLDL 16 12/19/2019 0552   LDLCALC 78 11/01/2020 0755    CBC    Component Value Date/Time   WBC 6.9 11/01/2020 0755   RBC 4.41 11/01/2020 0755   HGB 13.5 11/01/2020 0755   HCT 40.7 11/01/2020 0755   PLT 191 11/01/2020 0755   MCV 92.3 11/01/2020 0755   MCH 30.6 11/01/2020 0755   MCHC 33.2 11/01/2020 0755   RDW 13.3 11/01/2020 0755   LYMPHSABS 2,651 09/03/2019 0928   EOSABS 74 09/03/2019 0928   BASOSABS 74 09/03/2019 0928    Hgb A1C Lab Results  Component Value Date   HGBA1C 5.8 (H) 11/01/2020            Assessment & Plan:    RTC in 6 months for your annual exam Webb Silversmith, NP

## 2022-01-10 LAB — LIPID PANEL
Cholesterol: 173 mg/dL (ref <200)
HDL: 66 mg/dL (ref >=50)
LDL Cholesterol (Calc): 86 mg/dL (calc)
Non-HDL Cholesterol (Calc): 107 mg/dL (calc) (ref <130)
Total CHOL/HDL Ratio: 2.6 (calc) (ref <5.0)
Triglycerides: 111 mg/dL (ref <150)

## 2022-01-10 LAB — COMPLETE METABOLIC PANEL WITH GFR
AG Ratio: 1.9 (calc) (ref 1.0–2.5)
ALT: 8 U/L (ref 6–29)
AST: 15 U/L (ref 10–35)
Albumin: 4.1 g/dL (ref 3.6–5.1)
Alkaline phosphatase (APISO): 67 U/L (ref 37–153)
BUN/Creatinine Ratio: 30 (calc) — ABNORMAL HIGH (ref 6–22)
BUN: 33 mg/dL — ABNORMAL HIGH (ref 7–25)
CO2: 28 mmol/L (ref 20–32)
Calcium: 9.4 mg/dL (ref 8.6–10.4)
Chloride: 103 mmol/L (ref 98–110)
Creat: 1.11 mg/dL — ABNORMAL HIGH (ref 0.60–1.00)
Globulin: 2.2 g/dL (calc) (ref 1.9–3.7)
Glucose, Bld: 91 mg/dL (ref 65–99)
Potassium: 5.1 mmol/L (ref 3.5–5.3)
Sodium: 140 mmol/L (ref 135–146)
Total Bilirubin: 0.3 mg/dL (ref 0.2–1.2)
Total Protein: 6.3 g/dL (ref 6.1–8.1)
eGFR: 53 mL/min/{1.73_m2} — ABNORMAL LOW (ref >=60)

## 2022-01-10 LAB — CBC
HCT: 42.8 % (ref 35.0–45.0)
Hemoglobin: 14 g/dL (ref 11.7–15.5)
MCH: 29.5 pg (ref 27.0–33.0)
MCHC: 32.7 g/dL (ref 32.0–36.0)
MCV: 90.3 fL (ref 80.0–100.0)
MPV: 12.1 fL (ref 7.5–12.5)
Platelets: 194 10*3/uL (ref 140–400)
RBC: 4.74 10*6/uL (ref 3.80–5.10)
RDW: 13.4 % (ref 11.0–15.0)
WBC: 7.6 10*3/uL (ref 3.8–10.8)

## 2022-01-10 LAB — HEMOGLOBIN A1C
Hgb A1c MFr Bld: 5.6 % of total Hgb (ref <5.7)
Mean Plasma Glucose: 114 mg/dL
eAG (mmol/L): 6.3 mmol/L

## 2022-01-16 ENCOUNTER — Other Ambulatory Visit: Payer: Self-pay | Admitting: Internal Medicine

## 2022-01-16 DIAGNOSIS — F5104 Psychophysiologic insomnia: Secondary | ICD-10-CM

## 2022-01-16 NOTE — Telephone Encounter (Signed)
Requested medication (s) are due for refill today - yes  Requested medication (s) are on the active medication list -yes  Future visit scheduled -yes  Last refill: 12/20/21 #30   Notes to clinic: non delegated Rx  Requested Prescriptions  Pending Prescriptions Disp Refills   zolpidem (AMBIEN) 5 MG tablet [Pharmacy Med Name: Zolpidem Tartrate 5 MG Oral Tablet] 30 tablet 0    Sig: TAKE 1 TABLET BY MOUTH AT BEDTIME     Not Delegated - Psychiatry:  Anxiolytics/Hypnotics Failed - 01/16/2022 12:08 PM      Failed - This refill cannot be delegated      Failed - Urine Drug Screen completed in last 360 days      Passed - Valid encounter within last 6 months    Recent Outpatient Visits           1 week ago Prediabetes   [facility] [place], Coralie Keens, NP   8 months ago Encounter for general adult medical examination with abnormal findings   [facility] [place], Coralie Keens, NP   9 months ago Adjustment disorder with depressed mood   [place], DO   1 year ago Acute gout involving toe of right foot, unspecified cause   [place], Devonne Doughty, DO   1 year ago Essential hypertension   [place], Coralie Keens, NP       Future Appointments             In 6 months [place], Coralie Keens, NP [facility], [facility]               Requested Prescriptions  Pending Prescriptions Disp Refills   zolpidem (AMBIEN) 5 MG tablet [Pharmacy Med Name: Zolpidem Tartrate 5 MG Oral Tablet] 30 tablet 0    Sig: TAKE 1 TABLET BY MOUTH AT BEDTIME     Not Delegated - Psychiatry:  Anxiolytics/Hypnotics Failed - 01/16/2022 12:08 PM      Failed - This refill cannot be delegated      Failed - Urine Drug Screen completed in last 360 days      Passed - Valid encounter within last 6 months    Recent Outpatient Visits           1 week ago Prediabetes   [facility]  [place], Coralie Keens, NP   8 months ago Encounter for general adult medical examination with abnormal findings   [facility] [place], Coralie Keens, NP   9 months ago Adjustment disorder with depressed mood   [place], DO   1 year ago Acute gout involving toe of right foot, unspecified cause   [place], Devonne Doughty, DO   1 year ago Essential hypertension   [place], Coralie Keens, NP       Future Appointments             In 6 months Baity, Coralie Keens, NP [facility], [facility]

## 2022-01-17 ENCOUNTER — Other Ambulatory Visit: Payer: Self-pay | Admitting: Internal Medicine

## 2022-01-17 DIAGNOSIS — F5104 Psychophysiologic insomnia: Secondary | ICD-10-CM

## 2022-01-17 DIAGNOSIS — F4321 Adjustment disorder with depressed mood: Secondary | ICD-10-CM

## 2022-01-17 NOTE — Telephone Encounter (Signed)
Medication Refill - Medication: traZODone (DESYREL) 50 MG tablet  Has the patient contacted their pharmacy? Yes.    Preferred Pharmacy (with phone number or street name):  [facility] [place] - [address] Phone:  [phone]  Fax:  [phone]     Has the patient been seen for an appointment in the last year OR does the patient have an upcoming appointment? Yes.

## 2022-01-17 NOTE — Telephone Encounter (Signed)
Requested Prescriptions  Pending Prescriptions Disp Refills  . traZODone (DESYREL) 50 MG tablet [Pharmacy Med Name: traZODone HCl 50 MG Oral Tablet] 30 tablet 0    Sig: TAKE 1 TABLET BY MOUTH AT BEDTIME . APPOINTMENT REQUIRED FOR FUTURE REFILLS     Psychiatry: Antidepressants - Serotonin Modulator Passed - 01/17/2022  9:39 AM      Passed - Completed PHQ-2 or PHQ-9 in the last 360 days      Passed - Valid encounter within last 6 months    Recent Outpatient Visits          1 week ago Prediabetes   [facility] [place], Coralie Keens, NP   8 months ago Encounter for general adult medical examination with abnormal findings   [facility] [place], Coralie Keens, NP   9 months ago Adjustment disorder with depressed mood   [place], DO   1 year ago Acute gout involving toe of right foot, unspecified cause   [facility] [place] Hauser, DO   1 year ago Essential hypertension   [place], Coralie Keens, NP      Future Appointments            In 6 months Baity, Coralie Keens, NP [facility], [facility]

## 2022-01-17 NOTE — Telephone Encounter (Signed)
Rx filled today-duplicate request Requested Prescriptions  Pending Prescriptions Disp Refills  . traZODone (DESYREL) 50 MG tablet 30 tablet 0    Sig: Take 1 tablet (50 mg total) by mouth at bedtime.     Psychiatry: Antidepressants - Serotonin Modulator Passed - 01/17/2022 11:23 AM      Passed - Completed PHQ-2 or PHQ-9 in the last 360 days      Passed - Valid encounter within last 6 months    Recent Outpatient Visits          1 week ago Prediabetes   [facility] [place], Coralie Keens, NP   8 months ago Encounter for general adult medical examination with abnormal findings   [facility] [place], Coralie Keens, NP   9 months ago Adjustment disorder with depressed mood   [place], DO   1 year ago Acute gout involving toe of right foot, unspecified cause   [facility] Olin Hauser, DO   1 year ago Essential hypertension   [place], Coralie Keens, NP      Future Appointments            In 6 months Baity, Coralie Keens, NP [facility], [facility]

## 2022-01-18 ENCOUNTER — Telehealth: Payer: Self-pay

## 2022-01-18 DIAGNOSIS — N182 Chronic kidney disease, stage 2 (mild): Secondary | ICD-10-CM

## 2022-01-18 NOTE — Telephone Encounter (Signed)
Copied from [place] [id number]. Topic: General - Inquiry >> Jan 17, 2022  9:35 AM Erskine Squibb wrote: Reason for CRM: Patient states she still has not heard back from anyone regarding her lab results. Please assist patient further

## 2022-01-18 NOTE — Telephone Encounter (Signed)
Tried calling; Pt's voicemail is not set up.  PEC please advise pt of lab below when she calls back.   Thanks,   -Mickel Baas   Your kidney function is slightly worse.  This could be due to third furosemide and we might want to consider stopping this.  You should consume 48 to 64 ounces of water daily and avoid anti-inflammatories OTC.  Your liver function is normal.  Blood counts are normal.  A1c is down to 5.6%, which is out of the prediabetic range.  Cholesterol looks great.  Written by Jearld Fenton, NP on 01/10/2022  7:09 AM EDT

## 2022-01-18 NOTE — Telephone Encounter (Signed)
Pt given lab results per notes of Webb Silversmith, NP on 01/10/22. Pt verbalized understanding. She says she has to take furosemide, so she says she will take it every other day and see if that will help her kidneys. She asked if she needed repeat blood work, but then said she's back in February and was told to fast for blood work. Advised I will send this to [facility] and someone will call with her recommendation.

## 2022-01-19 NOTE — Telephone Encounter (Signed)
Tried calling; pt's voicemail is full.  PEC please schedule a lab only visit for her in a month.   Thanks,   -Mickel Baas

## 2022-01-19 NOTE — Telephone Encounter (Signed)
Noted.  I would like to check her kidney function before February.  Have her schedule a lab only appointment in 1 month.

## 2022-01-19 NOTE — Addendum Note (Signed)
Addended by: Jearld Fenton on: 01/19/2022 11:41 AM   Modules accepted: Orders

## 2022-02-19 ENCOUNTER — Other Ambulatory Visit: Payer: Self-pay | Admitting: Internal Medicine

## 2022-02-19 DIAGNOSIS — F5104 Psychophysiologic insomnia: Secondary | ICD-10-CM

## 2022-02-20 NOTE — Telephone Encounter (Signed)
Requested medication (s) are due for refill today: yes  Requested medication (s) are on the active medication list: yes  Last refill:  01/16/22 #30 with 0 RF  Future visit scheduled: 07/19/22, seen 01/09/22  Notes to clinic:  This medication can not be delegated, please assess.        Requested Prescriptions  Pending Prescriptions Disp Refills   zolpidem (AMBIEN) 5 MG tablet [Pharmacy Med Name: Zolpidem Tartrate 5 MG Oral Tablet] 30 tablet 0    Sig: TAKE 1 TABLET BY MOUTH AT BEDTIME     Not Delegated - Psychiatry:  Anxiolytics/Hypnotics Failed - 02/19/2022 12:39 PM      Failed - This refill cannot be delegated      Failed - Urine Drug Screen completed in last 360 days      Passed - Valid encounter within last 6 months    Recent Outpatient Visits           1 month ago Prediabetes   [facility] [place], Coralie Keens, NP   9 months ago Encounter for general adult medical examination with abnormal findings   [facility] [place], Coralie Keens, NP   11 months ago Adjustment disorder with depressed mood   [place], DO   1 year ago Acute gout involving toe of right foot, unspecified cause   [place], Devonne Doughty, DO   1 year ago Essential hypertension   [place], NP       Future Appointments             In 4 months Baity, Coralie Keens, NP [facility], [facility]

## 2022-02-21 ENCOUNTER — Other Ambulatory Visit: Payer: Self-pay | Admitting: Internal Medicine

## 2022-02-22 NOTE — Telephone Encounter (Signed)
Requested Prescriptions  Pending Prescriptions Disp Refills  . furosemide (LASIX) 20 MG tablet [Pharmacy Med Name: Furosemide 20 MG Oral Tablet] 90 tablet 0    Sig: Take 1 tablet by mouth once daily     Cardiovascular:  Diuretics - Loop Failed - 02/21/2022 10:35 AM      Failed - Cr in normal range and within 180 days    Creat  Date Value Ref Range Status  01/09/2022 1.11 (H) 0.60 - 1.00 mg/dL Final         Failed - Mg Level in normal range and within 180 days    Magnesium  Date Value Ref Range Status  12/21/2019 1.9 1.7 - 2.4 mg/dL Final    Comment:    Performed at [facility], [address], [place], [place] [address]         [place] in normal range and within 180 days    Potassium  Date Value Ref Range Status  01/09/2022 5.1 3.5 - 5.3 mmol/L Final         Passed - Ca in normal range and within 180 days    Calcium  Date Value Ref Range Status  01/09/2022 9.4 8.6 - 10.4 mg/dL Final         Passed - Na in normal range and within 180 days    Sodium  Date Value Ref Range Status  01/09/2022 140 135 - 146 mmol/L Final         Passed - Cl in normal range and within 180 days    Chloride  Date Value Ref Range Status  01/09/2022 103 98 - 110 mmol/L Final         Passed - Last BP in normal range    BP Readings from Last 1 Encounters:  01/09/22 122/70         Passed - Valid encounter within last 6 months    Recent Outpatient Visits          1 month ago Prediabetes   [place], Coralie Keens, NP   9 months ago Encounter for general adult medical examination with abnormal findings   [facility] [place], Coralie Keens, NP   11 months ago Adjustment disorder with depressed mood   [place], DO   1 year ago Acute gout involving toe of right foot, unspecified cause   [place], Devonne Doughty, DO   1 year ago Essential hypertension   [place], Coralie Keens, NP      Future Appointments            In 4 months Baity, Coralie Keens, NP [facility], [place]           . lisinopril (ZESTRIL) 20 MG tablet [Pharmacy Med Name: Lisinopril 20 MG Oral Tablet] 90 tablet 0    Sig: Take 1 tablet by mouth once daily     Cardiovascular:  ACE Inhibitors Failed - 02/21/2022 10:35 AM      Failed - Cr in normal range and within 180 days    Creat  Date Value Ref Range Status  01/09/2022 1.11 (H) 0.60 - 1.00 mg/dL Final         Passed - K in normal range and within 180 days    Potassium  Date Value Ref Range Status  01/09/2022 5.1 3.5 - 5.3 mmol/L Final  Passed - Patient is not pregnant      Passed - Last BP in normal range    BP Readings from Last 1 Encounters:  01/09/22 122/70         Passed - Valid encounter within last 6 months    Recent Outpatient Visits          1 month ago Prediabetes   [facility] [place], Coralie Keens, NP   9 months ago Encounter for general adult medical examination with abnormal findings   [facility] [place], Coralie Keens, NP   11 months ago Adjustment disorder with depressed mood   [place], DO   1 year ago Acute gout involving toe of right foot, unspecified cause   [facility] Olin Hauser, DO   1 year ago Essential hypertension   [place], NP      Future Appointments            In 4 months Baity, Coralie Keens, NP [facility], [facility]

## 2022-03-22 ENCOUNTER — Other Ambulatory Visit: Payer: Self-pay | Admitting: Internal Medicine

## 2022-03-22 DIAGNOSIS — F5104 Psychophysiologic insomnia: Secondary | ICD-10-CM

## 2022-03-22 NOTE — Telephone Encounter (Signed)
Requested medication (s) are due for refill today: yes   Requested medication (s) are on the active medication list: yes   Last refill:  02/21/22 #30 0 refills  Future visit scheduled: yes in 3 months   Notes to clinic:  not delegated per protocol. Do you want to refill Rx?     Requested Prescriptions  Pending Prescriptions Disp Refills   zolpidem (AMBIEN) 5 MG tablet [Pharmacy Med Name: Zolpidem Tartrate 5 MG Oral Tablet] 30 tablet 0    Sig: TAKE 1 TABLET BY MOUTH AT BEDTIME     Not Delegated - Psychiatry:  Anxiolytics/Hypnotics Failed - 03/22/2022  9:56 AM      Failed - This refill cannot be delegated      Failed - Urine Drug Screen completed in last 360 days      Passed - Valid encounter within last 6 months    Recent Outpatient Visits           2 months ago Prediabetes   [facility] [place], Coralie Keens, NP   10 months ago Encounter for general adult medical examination with abnormal findings   [facility] [place], Coralie Keens, NP   12 months ago Adjustment disorder with depressed mood   [place], DO   1 year ago Acute gout involving toe of right foot, unspecified cause   [place], Devonne Doughty, DO   1 year ago Essential hypertension   [place], NP       Future Appointments             In 3 months Baity, Coralie Keens, NP [facility], [facility]

## 2022-04-15 ENCOUNTER — Other Ambulatory Visit: Payer: Self-pay | Admitting: Internal Medicine

## 2022-04-15 DIAGNOSIS — F5104 Psychophysiologic insomnia: Secondary | ICD-10-CM

## 2022-04-16 NOTE — Telephone Encounter (Signed)
Requested medication (s) are due for refill today:yes  Requested medication (s) are on the active medication list: yes  Last refill:  03/23/22  Future visit scheduled: yes  Notes to clinic:  Unable to refill per protocol, cannot delegate.      Requested Prescriptions  Pending Prescriptions Disp Refills   zolpidem (AMBIEN) 5 MG tablet [Pharmacy Med Name: Zolpidem Tartrate 5 MG Oral Tablet] 30 tablet 0    Sig: TAKE 1 TABLET BY MOUTH AT BEDTIME     Not Delegated - Psychiatry:  Anxiolytics/Hypnotics Failed - 04/15/2022  9:58 AM      Failed - This refill cannot be delegated      Failed - Urine Drug Screen completed in last 360 days      Passed - Valid encounter within last 6 months    Recent Outpatient Visits           3 months ago Prediabetes   [facility] [place], Coralie Keens, NP   11 months ago Encounter for general adult medical examination with abnormal findings   [facility] [place], Coralie Keens, NP   1 year ago Adjustment disorder with depressed mood   [place], DO   1 year ago Acute gout involving toe of right foot, unspecified cause   [place], Devonne Doughty, DO   1 year ago Essential hypertension   [place], NP       Future Appointments             In 3 months Baity, Coralie Keens, NP [facility], [facility]

## 2022-05-18 ENCOUNTER — Other Ambulatory Visit: Payer: Self-pay | Admitting: Internal Medicine

## 2022-05-18 NOTE — Telephone Encounter (Signed)
Requested Prescriptions  Pending Prescriptions Disp Refills   lisinopril (ZESTRIL) 20 MG tablet [Pharmacy Med Name: Lisinopril 20 MG Oral Tablet] 90 tablet 0    Sig: Take 1 tablet by mouth once daily     Cardiovascular:  ACE Inhibitors Failed - 05/18/2022  6:50 AM      Failed - Cr in normal range and within 180 days    Creat  Date Value Ref Range Status  01/09/2022 1.11 (H) 0.60 - 1.00 mg/dL Final         Passed - K in normal range and within 180 days    Potassium  Date Value Ref Range Status  01/09/2022 5.1 3.5 - 5.3 mmol/L Final         Passed - Patient is not pregnant      Passed - Last BP in normal range    BP Readings from Last 1 Encounters:  01/09/22 122/70         Passed - Valid encounter within last 6 months    Recent Outpatient Visits           4 months ago Prediabetes   [facility] [place], Coralie Keens, NP   1 year ago Encounter for general adult medical examination with abnormal findings   [facility] [place], Coralie Keens, NP   1 year ago Adjustment disorder with depressed mood   [place], DO   1 year ago Acute gout involving toe of right foot, unspecified cause   [place], Devonne Doughty, DO   1 year ago Essential hypertension   [place], Coralie Keens, NP       Future Appointments             In 2 months [place], Coralie Keens, NP [facility], [facility]             furosemide (LASIX) 20 MG tablet [Pharmacy Med Name: Furosemide 20 MG Oral Tablet] 90 tablet 0    Sig: Take 1 tablet by mouth once daily     Cardiovascular:  Diuretics - Loop Failed - 05/18/2022  6:50 AM      Failed - Cr in normal range and within 180 days    Creat  Date Value Ref Range Status  01/09/2022 1.11 (H) 0.60 - 1.00 mg/dL Final         Failed - Mg Level in normal range and within 180 days    Magnesium  Date Value Ref Range Status  12/21/2019 1.9 1.7 - 2.4  mg/dL Final    Comment:    Performed at [facility], [address] [place], [place] [address]         [place] in normal range and within 180 days    Potassium  Date Value Ref Range Status  01/09/2022 5.1 3.5 - 5.3 mmol/L Final         Passed - Ca in normal range and within 180 days    Calcium  Date Value Ref Range Status  01/09/2022 9.4 8.6 - 10.4 mg/dL Final         Passed - Na in normal range and within 180 days    Sodium  Date Value Ref Range Status  01/09/2022 140 135 - 146 mmol/L Final         Passed - Cl in normal range and within 180 days    Chloride  Date Value Ref  Range Status  01/09/2022 103 98 - 110 mmol/L Final         Passed - Last BP in normal range    BP Readings from Last 1 Encounters:  01/09/22 122/70         Passed - Valid encounter within last 6 months    Recent Outpatient Visits           4 months ago Prediabetes   [facility] [place], Coralie Keens, NP   1 year ago Encounter for general adult medical examination with abnormal findings   [facility] [place], Coralie Keens, NP   1 year ago Adjustment disorder with depressed mood   [place], DO   1 year ago Acute gout involving toe of right foot, unspecified cause   [facility] Olin Hauser, DO   1 year ago Essential hypertension   [place], NP       Future Appointments             In 2 months Baity, Coralie Keens, NP [facility], [facility]

## 2022-05-26 ENCOUNTER — Other Ambulatory Visit: Payer: Self-pay | Admitting: Internal Medicine

## 2022-05-26 DIAGNOSIS — F5104 Psychophysiologic insomnia: Secondary | ICD-10-CM

## 2022-05-26 DIAGNOSIS — F329 Major depressive disorder, single episode, unspecified: Secondary | ICD-10-CM

## 2022-05-28 NOTE — Telephone Encounter (Signed)
Requested medication (s) are due for refill today: yes  Requested medication (s) are on the active medication list: yes  Last refill:  04/16/22 #30  Future visit scheduled: yes  Notes to clinic:  med not delegated to NT to RF   Requested Prescriptions  Pending Prescriptions Disp Refills   zolpidem (AMBIEN) 5 MG tablet [Pharmacy Med Name: Zolpidem Tartrate 5 MG Oral Tablet] 30 tablet     Sig: TAKE 1 TABLET BY MOUTH AT BEDTIME     Not Delegated - Psychiatry:  Anxiolytics/Hypnotics Failed - 05/26/2022  4:19 PM      Failed - This refill cannot be delegated      Failed - Urine Drug Screen completed in last 360 days      Passed - Valid encounter within last 6 months    Recent Outpatient Visits           4 months ago Prediabetes   [facility] [place], Coralie Keens, NP   1 year ago Encounter for general adult medical examination with abnormal findings   [facility] [place], Coralie Keens, NP   1 year ago Adjustment disorder with depressed mood   [place], DO   1 year ago Acute gout involving toe of right foot, unspecified cause   [place], Devonne Doughty, DO   1 year ago Essential hypertension   [place], Coralie Keens, NP       Future Appointments             In 1 month [place], Coralie Keens, NP [facility], [facility]            Signed Prescriptions Disp Refills   citalopram (CELEXA) 20 MG tablet 90 tablet 0    Sig: Take 1 tablet by mouth once daily     Psychiatry:  Antidepressants - SSRI Failed - 05/26/2022  4:19 PM      Failed - Completed PHQ-2 or PHQ-9 in the last 360 days      Passed - Valid encounter within last 6 months    Recent Outpatient Visits           4 months ago Prediabetes   [facility] [place], Coralie Keens, NP   1 year ago Encounter for general adult medical examination with abnormal findings   [facility]  [place], Coralie Keens, NP   1 year ago Adjustment disorder with depressed mood   [place], DO   1 year ago Acute gout involving toe of right foot, unspecified cause   [place], Devonne Doughty, DO   1 year ago Essential hypertension   [place], Coralie Keens, NP       Future Appointments             In 1 month [place], Coralie Keens, NP [facility] [place], [facility]

## 2022-06-18 ENCOUNTER — Telehealth: Payer: Self-pay | Admitting: Internal Medicine

## 2022-06-18 NOTE — Telephone Encounter (Signed)
Left message for patient to call back and schedule Medicare Annual Wellness Visit (AWV) to be done virtually or by telephone.  No hx of AWV eligible as of 12/10/15  Please schedule at anytime with [facility].        Any questions, please call me at [phone]

## 2022-07-01 ENCOUNTER — Other Ambulatory Visit: Payer: Self-pay | Admitting: Internal Medicine

## 2022-07-01 DIAGNOSIS — F5104 Psychophysiologic insomnia: Secondary | ICD-10-CM

## 2022-07-03 NOTE — Telephone Encounter (Signed)
Requested medications are due for refill today.  Provider to decide  Requested medications are on the active medications list.  yes  Last refill. 05/28/2022 #30 0 rf  Future visit scheduled.   yes  Notes to clinic.  Refill not delegated.    Requested Prescriptions  Pending Prescriptions Disp Refills   zolpidem (AMBIEN) 5 MG tablet [Pharmacy Med Name: Zolpidem Tartrate 5 MG Oral Tablet] 30 tablet 0    Sig: TAKE 1 TABLET BY MOUTH AT BEDTIME     Not Delegated - Psychiatry:  Anxiolytics/Hypnotics Failed - 07/01/2022  6:41 PM      Failed - This refill cannot be delegated      Failed - Urine Drug Screen completed in last 360 days      Passed - Valid encounter within last 6 months    Recent Outpatient Visits           5 months ago Prediabetes   [facility] [place], Coralie Keens, NP   1 year ago Encounter for general adult medical examination with abnormal findings   [facility] [place], Coralie Keens, NP   1 year ago Adjustment disorder with depressed mood   [place], DO   1 year ago Acute gout involving toe of right foot, unspecified cause   [facility] Olin Hauser, DO   1 year ago Essential hypertension   [facility] [place], Coralie Keens, NP       Future Appointments             In 2 weeks Garnette Gunner, Coralie Keens, NP [facility], [facility]

## 2022-07-16 ENCOUNTER — Other Ambulatory Visit: Payer: Self-pay | Admitting: Internal Medicine

## 2022-07-16 DIAGNOSIS — F4321 Adjustment disorder with depressed mood: Secondary | ICD-10-CM

## 2022-07-16 DIAGNOSIS — F5104 Psychophysiologic insomnia: Secondary | ICD-10-CM

## 2022-07-17 NOTE — Telephone Encounter (Signed)
Requested Prescriptions  Pending Prescriptions Disp Refills   omeprazole (PRILOSEC) 20 MG capsule [Pharmacy Med Name: Omeprazole 20 MG Oral Capsule Delayed Release] 90 capsule 0    Sig: Take 1 capsule by mouth once daily     Gastroenterology: Proton Pump Inhibitors Passed - 07/16/2022  7:06 PM      Passed - Valid encounter within last 12 months    Recent Outpatient Visits           6 months ago Prediabetes   [facility] [place], Coralie Keens, NP   1 year ago Encounter for general adult medical examination with abnormal findings   [facility] [place], Coralie Keens, NP   1 year ago Adjustment disorder with depressed mood   [place], DO   1 year ago Acute gout involving toe of right foot, unspecified cause   [facility] O[facility], DO   1 year ago Essential hypertension   [facility] [place], Coralie Keens, NP       Future Appointments             In 2 days Garnette Gunner, Coralie Keens, NP [facility], [facility]             traZODone (DESYREL) 50 MG tablet [Pharmacy Med Name: traZODone HCl 50 MG Oral Tablet] 90 tablet 0    Sig: TAKE 1 TABLET BY MOUTH AT BEDTIME . APPOINTMENT REQUIRED FOR FUTURE REFILLS     Psychiatry: Antidepressants - Serotonin Modulator Failed - 07/16/2022  7:06 PM      Failed - Completed PHQ-2 or PHQ-9 in the last 360 days      Failed - Valid encounter within last 6 months    Recent Outpatient Visits           6 months ago Prediabetes   [facility] [place], Coralie Keens, NP   1 year ago Encounter for general adult medical examination with abnormal findings   [facility] [place], [place], NP   1 year ago Adjustment disorder with depressed mood   [place], DO   1 year ago Acute gout  involving toe of right foot, unspecified cause   [facility] [place], Devonne Doughty, DO   1 year ago Essential hypertension   [facility] [place], Coralie Keens, NP       Future Appointments             In 2 days [place], Coralie Keens, NP [facility]             buPROPion (WELLBUTRIN XL) 150 MG 24 hr tablet [Pharmacy Med Name: buPROPion HCl ER (XL) 150 MG Oral Tablet Extended Release 24 Hour] 90 tablet 0    Sig: Take 1 tablet by mouth once daily     Psychiatry: Antidepressants - bupropion Failed - 07/16/2022  7:06 PM      Failed - Cr in normal range and within 360 days    Creat  Date Value Ref Range Status  01/09/2022 1.11 (H) 0.60 - 1.00 mg/dL Final         Failed - Completed PHQ-2 or PHQ-9 in the last 360 days      Failed - Valid encounter within  last 6 months    Recent Outpatient Visits           6 months ago Prediabetes   [facility] [place], Coralie Keens, NP   1 year ago Encounter for general adult medical examination with abnormal findings   [facility] [place], Coralie Keens, NP   1 year ago Adjustment disorder with depressed mood   [place], DO   1 year ago Acute gout involving toe of right foot, unspecified cause   [facility] Olin Haus[facility], DO   1 year ago Essential hypertension   [facility] [place], Coralie Keens, NP       Future Appointments             In 2 days Jearld Fenton, NP [facility], [place] - AST in normal range and within 360 days    AST  Date Value Ref Range Status  01/09/2022 15 10 - 35 U/L Final         Passed - ALT in normal range and within 360 days    ALT  Date Value Ref Range Status  01/09/2022 8 6 - 29 U/L Final         Passed - Last BP in  normal range    BP Readings from Last 1 Encounters:  01/09/22 122/70

## 2022-07-19 ENCOUNTER — Encounter: Payer: Self-pay | Admitting: Internal Medicine

## 2022-07-19 ENCOUNTER — Ambulatory Visit (INDEPENDENT_AMBULATORY_CARE_PROVIDER_SITE_OTHER): Payer: PPO | Admitting: Internal Medicine

## 2022-07-19 VITALS — BP 120/68 | HR 68 | Temp 97.3°F | Ht 65.0 in | Wt 206.0 lb

## 2022-07-19 DIAGNOSIS — Z23 Encounter for immunization: Secondary | ICD-10-CM | POA: Diagnosis not present

## 2022-07-19 DIAGNOSIS — E6609 Other obesity due to excess calories: Secondary | ICD-10-CM

## 2022-07-19 DIAGNOSIS — Z0001 Encounter for general adult medical examination with abnormal findings: Secondary | ICD-10-CM | POA: Diagnosis not present

## 2022-07-19 DIAGNOSIS — R1012 Left upper quadrant pain: Secondary | ICD-10-CM

## 2022-07-19 DIAGNOSIS — Z1159 Encounter for screening for other viral diseases: Secondary | ICD-10-CM | POA: Diagnosis not present

## 2022-07-19 DIAGNOSIS — E782 Mixed hyperlipidemia: Secondary | ICD-10-CM | POA: Diagnosis not present

## 2022-07-19 DIAGNOSIS — Z6834 Body mass index (BMI) 34.0-34.9, adult: Secondary | ICD-10-CM

## 2022-07-19 DIAGNOSIS — R7303 Prediabetes: Secondary | ICD-10-CM

## 2022-07-19 NOTE — Assessment & Plan Note (Signed)
Encourage diet and exercise for weight loss 

## 2022-07-19 NOTE — Addendum Note (Signed)
Addended by: Ashley Royalty E on: 07/19/2022 11:10 AM   Modules accepted: Orders

## 2022-07-19 NOTE — Patient Instructions (Signed)
Health Maintenance for Postmenopausal Women Menopause is a normal process in which your ability to get pregnant comes to an end. This process happens slowly over many months or years, usually between the ages of 48 and 55. Menopause is complete when you have missed your menstrual period for 12 months. It is important to talk with your health care provider about some of the most common conditions that affect women after menopause (postmenopausal women). These include heart disease, cancer, and bone loss (osteoporosis). Adopting a healthy lifestyle and getting preventive care can help to promote your health and wellness. The actions you take can also lower your chances of developing some of these common conditions. What are the signs and symptoms of menopause? During menopause, you may have the following symptoms: Hot flashes. These can be moderate or severe. Night sweats. Decrease in sex drive. Mood swings. Headaches. Tiredness (fatigue). Irritability. Memory problems. Problems falling asleep or staying asleep. Talk with your health care provider about treatment options for your symptoms. Do I need hormone replacement therapy? Hormone replacement therapy is effective in treating symptoms that are caused by menopause, such as hot flashes and night sweats. Hormone replacement carries certain risks, especially as you become older. If you are thinking about using estrogen or estrogen with progestin, discuss the benefits and risks with your health care provider. How can I reduce my risk for heart disease and stroke? The risk of heart disease, heart attack, and stroke increases as you age. One of the causes may be a change in the body's hormones during menopause. This can affect how your body uses dietary fats, triglycerides, and cholesterol. Heart attack and stroke are medical emergencies. There are many things that you can do to help prevent heart disease and stroke. Watch your blood pressure High  blood pressure causes heart disease and increases the risk of stroke. This is more likely to develop in people who have high blood pressure readings or are overweight. Have your blood pressure checked: Every 3-5 years if you are 18-39 years of age. Every year if you are 40 years old or older. Eat a healthy diet  Eat a diet that includes plenty of vegetables, fruits, low-fat dairy products, and lean protein. Do not eat a lot of foods that are high in solid fats, added sugars, or sodium. Get regular exercise Get regular exercise. This is one of the most important things you can do for your health. Most adults should: Try to exercise for at least 150 minutes each week. The exercise should increase your heart rate and make you sweat (moderate-intensity exercise). Try to do strengthening exercises at least twice each week. Do these in addition to the moderate-intensity exercise. Spend less time sitting. Even light physical activity can be beneficial. Other tips Work with your health care provider to achieve or maintain a healthy weight. Do not use any products that contain nicotine or tobacco. These products include cigarettes, chewing tobacco, and vaping devices, such as e-cigarettes. If you need help quitting, ask your health care provider. Know your numbers. Ask your health care provider to check your cholesterol and your blood sugar (glucose). Continue to have your blood tested as directed by your health care provider. Do I need screening for cancer? Depending on your health history and family history, you may need to have cancer screenings at different stages of your life. This may include screening for: Breast cancer. Cervical cancer. Lung cancer. Colorectal cancer. What is my risk for osteoporosis? After menopause, you may be   at increased risk for osteoporosis. Osteoporosis is a condition in which bone destruction happens more quickly than new bone creation. To help prevent osteoporosis or  the bone fractures that can happen because of osteoporosis, you may take the following actions: If you are 19-50 years old, get at least 1,000 mg of calcium and at least 600 international units (IU) of vitamin D per day. If you are older than age 50 but younger than age 70, get at least 1,200 mg of calcium and at least 600 international units (IU) of vitamin D per day. If you are older than age 70, get at least 1,200 mg of calcium and at least 800 international units (IU) of vitamin D per day. Smoking and drinking excessive alcohol increase the risk of osteoporosis. Eat foods that are rich in calcium and vitamin D, and do weight-bearing exercises several times each week as directed by your health care provider. How does menopause affect my mental health? Depression may occur at any age, but it is more common as you become older. Common symptoms of depression include: Feeling depressed. Changes in sleep patterns. Changes in appetite or eating patterns. Feeling an overall lack of motivation or enjoyment of activities that you previously enjoyed. Frequent crying spells. Talk with your health care provider if you think that you are experiencing any of these symptoms. General instructions See your health care provider for regular wellness exams and vaccines. This may include: Scheduling regular health, dental, and eye exams. Getting and maintaining your vaccines. These include: Influenza vaccine. Get this vaccine each year before the flu season begins. Pneumonia vaccine. Shingles vaccine. Tetanus, diphtheria, and pertussis (Tdap) booster vaccine. Your health care provider may also recommend other immunizations. Tell your health care provider if you have ever been abused or do not feel safe at home. Summary Menopause is a normal process in which your ability to get pregnant comes to an end. This condition causes hot flashes, night sweats, decreased interest in sex, mood swings, headaches, or lack  of sleep. Treatment for this condition may include hormone replacement therapy. Take actions to keep yourself healthy, including exercising regularly, eating a healthy diet, watching your weight, and checking your blood pressure and blood sugar levels. Get screened for cancer and depression. Make sure that you are up to date with all your vaccines. This information is not intended to replace advice given to you by your health care provider. Make sure you discuss any questions you have with your health care provider. Document Revised: 10/17/2020 Document Reviewed: 10/17/2020 Elsevier Patient Education  2023 Elsevier Inc.  

## 2022-07-19 NOTE — Progress Notes (Signed)
Subjective:    Patient ID: Linda Farley, female    DOB: [birth year], 72 y.o.   MRN: [id number]  HPI  Patient presents to clinic today for annual exam.  Flu: 03/2022 Tetanus: < 10 years ago COVID: Moderna x 2 Pneumovax: 12/2019 Prevnar: never Shingrix: never Pap smear: Hysterectomy Mammogram: never Bone density: 03/2020 Colon screening: 09/2019 Vision screening: as needed Dentist: biannually  Diet: She does eat meat. She consumes fruits and veggies. She does eat some fried foods. She drinks mostly water, [place] Exercise: Walking  Review of Systems     Past Medical History:  Diagnosis Date   Allergy    Anxiety    Arthritis    Depression    Hypertension     Current Outpatient Medications  Medication Sig Dispense Refill   albuterol (VENTOLIN HFA) 108 (90 Base) MCG/ACT inhaler INHALE 2 PUFFS BY MOUTH EVERY 6 HOURS AS NEEDED FOR WHEEZING OR SHORTNESS OF BREATH 9 g 2   buPROPion (WELLBUTRIN XL) 150 MG 24 hr tablet Take 1 tablet by mouth once daily 90 tablet 0   calcium carbonate (TUMS - DOSED IN MG ELEMENTAL CALCIUM) 500 MG chewable tablet Chew 1 tablet by mouth daily.     Cholecalciferol (D3 SUPER STRENGTH) 50 MCG (2000 UT) CAPS Take by mouth.     citalopram (CELEXA) 20 MG tablet Take 1 tablet by mouth once daily 90 tablet 0   furosemide (LASIX) 20 MG tablet Take 1 tablet by mouth once daily 90 tablet 0   gabapentin (NEURONTIN) 300 MG capsule Take 1 capsule (300 mg total) by mouth as directed. One capsule in the morning and two capsules every day at bedtime 270 capsule 1   HYDROcodone-acetaminophen (NORCO) 7.5-325 MG tablet      lisinopril (ZESTRIL) 20 MG tablet Take 1 tablet by mouth once daily 90 tablet 0   nystatin (MYCOSTATIN/NYSTOP) powder Apply 1 Application topically 3 (three) times daily. 60 g 1   omeprazole (PRILOSEC) 20 MG capsule Take 1 capsule by mouth once daily 90 capsule 0   rOPINIRole (REQUIP XL) 2 MG 24 hr tablet Take 1 tablet (2 mg total) by mouth at  bedtime. 90 tablet 1   traZODone (DESYREL) 50 MG tablet TAKE 1 TABLET BY MOUTH AT BEDTIME . APPOINTMENT REQUIRED FOR FUTURE REFILLS 90 tablet 0   zolpidem (AMBIEN) 5 MG tablet TAKE 1 TABLET BY MOUTH AT BEDTIME 30 tablet 0   No current facility-administered medications for this visit.    Allergies  Allergen Reactions   Chlorhexidine    Penicillins Rash    Family History  Problem Relation Age of Onset   Prostate cancer Father    Bone cancer Brother    Heart disease Brother     Social History   Socioeconomic History   Marital status: Married    Spouse name: Not on file   Number of children: Not on file   Years of education: Not on file   Highest education level: Not on file  Occupational History   Not on file  Tobacco Use   Smoking status: Never   Smokeless tobacco: Never  Vaping Use   Vaping Use: Never used  Substance and Sexual Activity   Alcohol use: Yes    Alcohol/week: 1.0 standard drink of alcohol    Types: 1 Glasses of wine per week    Comment: 3 wine coolers weekly   Drug use: Never   Sexual activity: Not on file  Other Topics Concern  Not on file  Social History Narrative   Not on file   Social Determinants of Health   Financial Resource Strain: Not on file  Food Insecurity: Not on file  Transportation Needs: Not on file  Physical Activity: Not on file  Stress: Not on file  Social Connections: Not on file  Intimate Partner Violence: Not on file     Constitutional: Denies fever, malaise, fatigue, headache or abrupt weight changes.  HEENT: Denies eye pain, eye redness, ear pain, ringing in the ears, wax buildup, runny nose, nasal congestion, bloody nose, or sore throat. Respiratory: Denies difficulty breathing, shortness of breath, cough or sputum production.   Cardiovascular: Denies chest pain, chest tightness, palpitations or swelling in the hands or feet.  Gastrointestinal: Patient reports left upper quadrant pain.  Denies bloating, constipation,  diarrhea or blood in the stool.  GU: Denies urgency, frequency, pain with urination, burning sensation, blood in urine, odor or discharge. Musculoskeletal: Patient reports intermittent joint pain.  Denies decrease in range of motion, difficulty with gait, muscle pain or joint swelling.  Skin: Denies redness, rashes, lesions or ulcercations.  Neurological: Patient reports insomnia and restless legs.  Denies dizziness, difficulty with memory, difficulty with speech or problems with balance and coordination.  Psych: Patient has a history of depression.  Denies anxiety, SI/HI.  No other specific complaints in a complete review of systems (except as listed in HPI above).  Objective:   Physical Exam  BP 120/68 (BP Location: Left Arm, Patient Position: Sitting, Cuff Size: Large)   Pulse 68   Temp (!) 97.3 F (36.3 C) (Temporal)   Ht '5\' 5"'$  (1.651 m)   Wt 206 lb (93.4 kg)   SpO2 98%   BMI 34.28 kg/m   Wt Readings from Last 3 Encounters:  01/09/22 208 lb (94.3 kg)  05/09/21 203 lb (92.1 kg)  03/24/21 205 lb 6.4 oz (93.2 kg)    General: Appears her stated age, obese, in NAD. Skin: Warm, dry and intact. No rashes noted. HEENT: Head: normal shape and size; Eyes: sclera white, no icterus, conjunctiva pink, PERRLA and EOMs intact;  Neck:  Neck supple, trachea midline. No masses, lumps or thyromegaly present.  Cardiovascular: Normal rate and rhythm. S1,S2 noted.  No murmur, rubs or gallops noted. No JVD or BLE edema. No carotid bruits noted. Pulmonary/Chest: Normal effort and positive vesicular breath sounds. No respiratory distress. No wheezes, rales or ronchi noted.  Abdomen: Normal bowel sounds.  No mass or tenderness noted.  Active bowel sounds.  Liver, spleen and kidneys nonpalpable. Musculoskeletal: Strength 5/5 BUE/BLE. No difficulty with gait.  Neurological: Alert and oriented. Cranial nerves II-XII grossly intact. Coordination normal.  Psychiatric: Mood and affect normal. Behavior is  normal. Judgment and thought content normal.     BMET    Component Value Date/Time   NA 140 01/09/2022 1105   K 5.1 01/09/2022 1105   CL 103 01/09/2022 1105   CO2 28 01/09/2022 1105   GLUCOSE 91 01/09/2022 1105   BUN 33 (H) 01/09/2022 1105   CREATININE 1.11 (H) 01/09/2022 1105   CALCIUM 9.4 01/09/2022 1105   GFRNONAA >60 12/23/2019 0508   GFRAA >60 12/23/2019 0508    Lipid Panel     Component Value Date/Time   CHOL 173 01/09/2022 1105   TRIG 111 01/09/2022 1105   HDL 66 01/09/2022 1105   CHOLHDL 2.6 01/09/2022 1105   VLDL 16 12/19/2019 0552   LDLCALC 86 01/09/2022 1105    CBC  Component Value Date/Time   WBC 7.6 01/09/2022 1105   RBC 4.74 01/09/2022 1105   HGB 14.0 01/09/2022 1105   HCT 42.8 01/09/2022 1105   PLT 194 01/09/2022 1105   MCV 90.3 01/09/2022 1105   MCH 29.5 01/09/2022 1105   MCHC 32.7 01/09/2022 1105   RDW 13.4 01/09/2022 1105   LYMPHSABS 2,651 09/03/2019 0928   EOSABS 74 09/03/2019 0928   BASOSABS 74 09/03/2019 0928    Hgb A1C Lab Results  Component Value Date   HGBA1C 5.6 01/09/2022            Assessment & Plan:   Preventative Health Maintenance:  Encouraged her to get a flu shot the fall She declines tetanus for financial reasons, advised if she gets better To go get this done Encouraged her to get her COVID booster Prevnar 20 today Pneumovax UTD Discussed Shingrix vaccine, she will check coverage with her insurance company and schedule visit if she would like to have this done She no longer needs Pap smears She declines mammograms Bone density UTD Colon screening UTD Encouraged her to consume a balanced diet and exercise regimen Advised her to see an eye doctor and dentist annually We will check CBC, c-Met, lipid, A1c and hep C today  LUQ Pain:  Will obtain CT abdomen/pelvis for further evaluation  RTC in 6 months, follow-up chronic conditions Webb Silversmith, NP

## 2022-07-20 LAB — COMPLETE METABOLIC PANEL WITH GFR
AG Ratio: 1.7 (calc) (ref 1.0–2.5)
ALT: 8 U/L (ref 6–29)
AST: 15 U/L (ref 10–35)
Albumin: 4.3 g/dL (ref 3.6–5.1)
Alkaline phosphatase (APISO): 65 U/L (ref 37–153)
BUN/Creatinine Ratio: 17 (calc) (ref 6–22)
BUN: 20 mg/dL (ref 7–25)
CO2: 28 mmol/L (ref 20–32)
Calcium: 9.6 mg/dL (ref 8.6–10.4)
Chloride: 102 mmol/L (ref 98–110)
Creat: 1.16 mg/dL — ABNORMAL HIGH (ref 0.60–1.00)
Globulin: 2.5 g/dL (calc) (ref 1.9–3.7)
Glucose, Bld: 91 mg/dL (ref 65–99)
Potassium: 4.5 mmol/L (ref 3.5–5.3)
Sodium: 142 mmol/L (ref 135–146)
Total Bilirubin: 0.5 mg/dL (ref 0.2–1.2)
Total Protein: 6.8 g/dL (ref 6.1–8.1)
eGFR: 50 mL/min/{1.73_m2} — ABNORMAL LOW (ref >=60)

## 2022-07-20 LAB — LIPID PANEL
Cholesterol: 204 mg/dL — ABNORMAL HIGH (ref <200)
HDL: 68 mg/dL (ref >=50)
LDL Cholesterol (Calc): 116 mg/dL (calc) — ABNORMAL HIGH
Non-HDL Cholesterol (Calc): 136 mg/dL (calc) — ABNORMAL HIGH (ref <130)
Total CHOL/HDL Ratio: 3 (calc) (ref <5.0)
Triglycerides: 95 mg/dL (ref <150)

## 2022-07-20 LAB — HEMOGLOBIN A1C
Hgb A1c MFr Bld: 6 % of total Hgb — ABNORMAL HIGH (ref <5.7)
Mean Plasma Glucose: 126 mg/dL
eAG (mmol/L): 7 mmol/L

## 2022-07-20 LAB — CBC
HCT: 41.5 % (ref 35.0–45.0)
Hemoglobin: 14.1 g/dL (ref 11.7–15.5)
MCH: 29.9 pg (ref 27.0–33.0)
MCHC: 34 g/dL (ref 32.0–36.0)
MCV: 88.1 fL (ref 80.0–100.0)
MPV: 12.7 fL — ABNORMAL HIGH (ref 7.5–12.5)
Platelets: 192 10*3/uL (ref 140–400)
RBC: 4.71 10*6/uL (ref 3.80–5.10)
RDW: 14.3 % (ref 11.0–15.0)
WBC: 5.8 10*3/uL (ref 3.8–10.8)

## 2022-07-20 LAB — HEPATITIS C ANTIBODY: Hepatitis C Ab: NONREACTIVE

## 2022-07-23 ENCOUNTER — Ambulatory Visit
Admission: RE | Admit: 2022-07-23 | Discharge: 2022-07-23 | Disposition: A | Discharge status: Home / Self Care | Payer: PPO | Source: Ambulatory Visit | Attending: Internal Medicine | Admitting: Internal Medicine

## 2022-07-23 DIAGNOSIS — R1012 Left upper quadrant pain: Secondary | ICD-10-CM | POA: Insufficient documentation

## 2022-07-23 MED ORDER — IOHEXOL 300 MG/ML  SOLN
100.0000 mL | Freq: Once | INTRAMUSCULAR | Status: AC | PRN
  Administered 2022-07-23: 100 mL via INTRAVENOUS

## 2022-07-24 ENCOUNTER — Telehealth: Payer: Self-pay

## 2022-07-24 NOTE — Telephone Encounter (Signed)
Pt given lab results per notes of R. Baity NP on 07/24/22. Pt verbalized understanding.Pt would like hard copy of lab work (and CT results when results are back).

## 2022-07-25 ENCOUNTER — Ambulatory Visit: Payer: Self-pay | Admitting: *Deleted

## 2022-07-25 DIAGNOSIS — N2889 Other specified disorders of kidney and ureter: Secondary | ICD-10-CM

## 2022-07-25 NOTE — Telephone Encounter (Signed)
Reason for Disposition  [1] Follow-up call to recent contact AND [2] information only call, no triage required  Answer Assessment - Initial Assessment Questions 1. REASON FOR CALL or QUESTION: "What is your reason for calling today?" or "How can I best help you?" or "What question do you have that I can help answer?"     Pt returned call and was given the message from Webb Silversmith, NP dated 07/25/2022 at 9:55 AM CT scan of her abd.  She does want the MRI to follow up on the mass found on her kidney.   If possible she would like to have it done at the same place that did her CT scan of abd.  Protocols used: Information Only Call - No Triage-A-AH

## 2022-07-26 NOTE — Addendum Note (Signed)
Addended by: Jearld Fenton on: 07/26/2022 07:56 AM   Modules accepted: Orders

## 2022-07-26 NOTE — Telephone Encounter (Signed)
MRI ordered

## 2022-07-29 ENCOUNTER — Ambulatory Visit
Admission: RE | Admit: 2022-07-29 | Discharge: 2022-07-29 | Disposition: A | Discharge status: Home / Self Care | Payer: PPO | Source: Ambulatory Visit | Attending: Internal Medicine | Admitting: Internal Medicine

## 2022-07-29 DIAGNOSIS — N2889 Other specified disorders of kidney and ureter: Secondary | ICD-10-CM | POA: Insufficient documentation

## 2022-07-29 MED ORDER — GADOBUTROL 1 MMOL/ML IV SOLN
9.0000 mL | Freq: Once | INTRAVENOUS | Status: AC | PRN
  Administered 2022-07-29: 10 mL via INTRAVENOUS

## 2022-07-30 ENCOUNTER — Telehealth: Payer: Self-pay | Admitting: Internal Medicine

## 2022-07-30 NOTE — Telephone Encounter (Signed)
Pt is calling to receive results on MR ABDOMEN [place] (Accession [id number]) (Order [id number])  Pt has calling to ask was her CAT scan and MRI mailed.   Please advise CB- [phone]

## 2022-08-01 NOTE — Telephone Encounter (Signed)
Results were released to her MyChart.  Please see my comments under that imaging and relay this to her.

## 2022-08-02 NOTE — Telephone Encounter (Signed)
Pt viewed on My Chart.     Thanks,   Mickel Baas      The MRI of your abdomen shows a benign cyst on the right kidney which is not of concern.  This is very common and does not require any further follow-up.  He also have multiple gallstones but no gallbladder inflammation.  At this point, as long as you are not having abdominal pain, nausea, vomiting or diarrhea after eating, no further intervention is needed for this either.  This is something that we will monitor is an incidental finding.  Written by Jearld Fenton, NP on 07/31/2022  8:35 AM EST Seen by patient [name] on 08/02/2022 11:45 AM

## 2022-08-06 ENCOUNTER — Other Ambulatory Visit: Payer: Self-pay | Admitting: Internal Medicine

## 2022-08-06 DIAGNOSIS — F5104 Psychophysiologic insomnia: Secondary | ICD-10-CM

## 2022-08-07 NOTE — Telephone Encounter (Signed)
Requested medication (s) are due for refill today: yes  Requested medication (s) are on the active medication list: yes  Last refill:  07/03/22  Future visit scheduled: yes  Notes to clinic:  Unable to refill per protocol, cannot delegate.      Requested Prescriptions  Pending Prescriptions Disp Refills   zolpidem (AMBIEN) 5 MG tablet [Pharmacy Med Name: Zolpidem Tartrate 5 MG Oral Tablet] 30 tablet 0    Sig: TAKE 1 TABLET BY MOUTH AT BEDTIME     Not Delegated - Psychiatry:  Anxiolytics/Hypnotics Failed - 08/06/2022  7:27 PM      Failed - This refill cannot be delegated      Failed - Urine Drug Screen completed in last 360 days      Passed - Valid encounter within last 6 months    Recent Outpatient Visits           2 weeks ago Encounter for general adult medical examination with abnormal findings   [facility] [place], Coralie Keens, NP   7 months ago [facility] [place], Coralie Keens, NP   1 year ago Encounter for general adult medical examination with abnormal findings   [facility] [place], Coralie Keens, NP   1 year ago Adjustment disorder with depressed mood   [place], DO   1 year ago Acute gout involving toe of right foot, unspecified cause   [place], DO       Future Appointments             In 5 months Baity, Coralie Keens, NP [facility]             rOPINIRole (REQUIP XL) 2 MG 24 hr tablet [Pharmacy Med Name: rOPINIRole HCl ER 2 MG Oral Tablet Extended Release 24 Hour] 90 tablet 0    Sig: TAKE 1 TABLET BY MOUTH AT BEDTIME     Neurology:  Parkinsonian Agents Passed - 08/06/2022  7:27 PM      Passed - Last BP in normal range    BP Readings from Last 1 Encounters:  07/19/22 120/68         Passed - Last Heart Rate in normal range    Pulse  Readings from Last 1 Encounters:  07/19/22 68         Passed - Valid encounter within last 12 months    Recent Outpatient Visits           2 weeks ago Encounter for general adult medical examination with abnormal findings   [facility] [place], Coralie Keens, NP   7 months ago [facility] [place], Coralie Keens, NP   1 year ago Encounter for general adult medical examination with abnormal findings   [facility] [place], Coralie Keens, NP   1 year ago Adjustment disorder with depressed mood   [place], DO   1 year ago Acute gout involving toe of right foot, unspecified cause   [place], DO       Future Appointments             In 5 months Baity, Coralie Keens, NP [place]

## 2022-08-18 ENCOUNTER — Other Ambulatory Visit: Payer: Self-pay | Admitting: Internal Medicine

## 2022-08-20 NOTE — Telephone Encounter (Signed)
Requested Prescriptions  Pending Prescriptions Disp Refills   lisinopril (ZESTRIL) 20 MG tablet [Pharmacy Med Name: Lisinopril 20 MG Oral Tablet] 90 tablet 1    Sig: Take 1 tablet by mouth once daily     Cardiovascular:  ACE Inhibitors Failed - 08/18/2022  6:50 AM      Failed - Cr in normal range and within 180 days    Creat  Date Value Ref Range Status  07/19/2022 1.16 (H) 0.60 - 1.00 mg/dL Final         Passed - K in normal range and within 180 days    Potassium  Date Value Ref Range Status  07/19/2022 4.5 3.5 - 5.3 mmol/L Final         Passed - Patient is not pregnant      Passed - Last BP in normal range    BP Readings from Last 1 Encounters:  07/19/22 120/68         Passed - Valid encounter within last 6 months    Recent Outpatient Visits           1 month ago Encounter for general adult medical examination with abnormal findings   [facility] [place], Coralie Keens, NP   7 months ago [facility] [place], [place], NP   1 year ago Encounter for general adult medical examination with abnormal findings   [facility] [place], Coralie Keens, NP   1 year ago Adjustment disorder with depressed mood   [place], DO   1 year ago Acute gout involving toe of right foot, unspecified cause   [place], Devonne Doughty, DO       Future Appointments             In 5 months Baity, Coralie Keens, NP [facility]             furosemide (LASIX) 20 MG tablet [Pharmacy Med Name: Furosemide 20 MG Oral Tablet] 90 tablet 1    Sig: Take 1 tablet by mouth once daily     Cardiovascular:  Diuretics - Loop Failed - 08/18/2022  6:50 AM      Failed - Cr in normal range and within 180 days    Creat  Date Value Ref Range Status  07/19/2022 1.16 (H) 0.60 - 1.00 mg/dL Final          Failed - Mg Level in normal range and within 180 days    Magnesium  Date Value Ref Range Status  12/21/2019 1.9 1.7 - 2.4 mg/dL Final    Comment:    Performed at [facility], [address] [place], [place] [address]         [place] in normal range and within 180 days    Potassium  Date Value Ref Range Status  07/19/2022 4.5 3.5 - 5.3 mmol/L Final         Passed - Ca in normal range and within 180 days    Calcium  Date Value Ref Range Status  07/19/2022 9.6 8.6 - 10.4 mg/dL Final         Passed - Na in normal range and within 180 days    Sodium  Date Value Ref Range Status  07/19/2022 142 135 - 146 mmol/L Final  Passed - Cl in normal range and within 180 days    Chloride  Date Value Ref Range Status  07/19/2022 102 98 - 110 mmol/L Final         Passed - Last BP in normal range    BP Readings from Last 1 Encounters:  07/19/22 120/68         Passed - Valid encounter within last 6 months    Recent Outpatient Visits           1 month ago Encounter for general adult medical examination with abnormal findings   [facility] [place], Coralie Keens, NP   7 months ago [facility] [place], Coralie Keens, NP   1 year ago Encounter for general adult medical examination with abnormal findings   [facility] [place], Coralie Keens, NP   1 year ago Adjustment disorder with depressed mood   [place], DO   1 year ago Acute gout involving toe of right foot, unspecified cause   [place], DO       Future Appointments             In 5 months Baity, Coralie Keens, NP [facility]

## 2022-08-21 ENCOUNTER — Other Ambulatory Visit: Payer: Self-pay | Admitting: Internal Medicine

## 2022-08-21 MED ORDER — NYSTATIN 100000 UNIT/GM EX POWD: 1.0000 | Freq: Three times a day (TID) | CUTANEOUS | 1 refills | Status: DC

## 2022-08-21 NOTE — Telephone Encounter (Signed)
Requested medication (s) are due for refill today: Yes  Requested medication (s) are on the active medication list:Yes  Last refill:  01/09/22  Future visit scheduled:Yes  Notes to clinic:  Unable to refill per protocol, medication not assigned to the refill protocol.      Requested Prescriptions  Pending Prescriptions Disp Refills   nystatin (MYCOSTATIN/NYSTOP) powder 60 g 1    Sig: Apply 1 Application topically 3 (three) times daily.     Off-Protocol Failed - 08/21/2022 11:53 AM      Failed - Medication not assigned to a protocol, review manually.      Passed - Valid encounter within last 12 months    Recent Outpatient Visits           1 month ago Encounter for general adult medical examination with abnormal findings   [facility] [place], Coralie Keens, NP   7 months ago [facility] [place], Coralie Keens, NP   1 year ago Encounter for general adult medical examination with abnormal findings   [facility] [place], Coralie Keens, NP   1 year ago Adjustment disorder with depressed mood   [place], DO   1 year ago Acute gout involving toe of right foot, unspecified cause   [place], DO       Future Appointments             In 4 months Baity, Coralie Keens, NP [facility], [place]               s

## 2022-08-21 NOTE — Telephone Encounter (Signed)
Medication Refill - Medication: nystatin (MYCOSTATIN/NYSTOP) powder   Pt puppy got a hold of the bottle and chewed it up. Pt states that she know her insurance will not cover it but would like for it to be called in and she will pay for it since she needs the medication.   Has the patient contacted their pharmacy? No.  Preferred Pharmacy (with phone number or street name):  [facility] [place] [place] - [address] Phone: [phone]  Fax: [phone]     Has the patient been seen for an appointment in the last year OR does the patient have an upcoming appointment? Yes.   siness days. We ask that you follow-up with your pharmacy.

## 2022-08-25 ENCOUNTER — Other Ambulatory Visit: Payer: Self-pay | Admitting: Internal Medicine

## 2022-08-25 DIAGNOSIS — F329 Major depressive disorder, single episode, unspecified: Secondary | ICD-10-CM

## 2022-08-27 NOTE — Telephone Encounter (Signed)
Requested Prescriptions  Pending Prescriptions Disp Refills   citalopram (CELEXA) 20 MG tablet [Pharmacy Med Name: Citalopram Hydrobromide 20 MG Oral Tablet] 90 tablet 0    Sig: Take 1 tablet by mouth once daily     Psychiatry:  Antidepressants - SSRI Passed - 08/25/2022  6:50 AM      Passed - Completed PHQ-2 or PHQ-9 in the last 360 days      Passed - Valid encounter within last 6 months    Recent Outpatient Visits           1 month ago Encounter for general adult medical examination with abnormal findings   [facility] [place], Coralie Keens, NP   7 months ago [facility] [place], Coralie Keens, NP   1 year ago Encounter for general adult medical examination with abnormal findings   [facility] [place], Coralie Keens, NP   1 year ago Adjustment disorder with depressed mood   [place], DO   1 year ago Acute gout involving toe of right foot, unspecified cause   [place], DO       Future Appointments             In 4 months Baity, Coralie Keens, NP [facility], [facility]

## 2022-08-31 ENCOUNTER — Other Ambulatory Visit: Payer: Self-pay | Admitting: Physical Medicine and Rehabilitation

## 2022-08-31 DIAGNOSIS — M5416 Radiculopathy, lumbar region: Secondary | ICD-10-CM

## 2022-09-10 ENCOUNTER — Other Ambulatory Visit: Payer: Self-pay | Admitting: Internal Medicine

## 2022-09-10 DIAGNOSIS — F5104 Psychophysiologic insomnia: Secondary | ICD-10-CM

## 2022-09-11 NOTE — Telephone Encounter (Signed)
Requested medication (s) are due for refill today - yes  Requested medication (s) are on the active medication list -yes  Future visit scheduled -yes  Last refill: 08/07/22 #30  Notes to clinic: non delegated Rx  Requested Prescriptions  Pending Prescriptions Disp Refills   zolpidem (AMBIEN) 5 MG tablet [Pharmacy Med Name: Zolpidem Tartrate 5 MG Oral Tablet] 30 tablet 0    Sig: TAKE 1 TABLET BY MOUTH AT BEDTIME     Not Delegated - Psychiatry:  Anxiolytics/Hypnotics Failed - 09/10/2022  4:10 PM      Failed - This refill cannot be delegated      Failed - Urine Drug Screen completed in last 360 days      Passed - Valid encounter within last 6 months    Recent Outpatient Visits           1 month ago Encounter for general adult medical examination with abnormal findings   [facility] [place], Coralie Keens, NP   8 months ago [facility] [place], Coralie Keens, NP   1 year ago Encounter for general adult medical examination with abnormal findings   [facility] [place], Coralie Keens, NP   1 year ago Adjustment disorder with depressed mood   [place], DO   1 year ago Acute gout involving toe of right foot, unspecified cause   [place], Devonne Doughty, DO       Future Appointments             In 4 months Baity, Coralie Keens, NP [facility], [facility]               Requested Prescriptions  Pending Prescriptions Disp Refills   zolpidem (AMBIEN) 5 MG tablet [Pharmacy Med Name: Zolpidem Tartrate 5 MG Oral Tablet] 30 tablet 0    Sig: TAKE 1 TABLET BY MOUTH AT BEDTIME     Not Delegated - Psychiatry:  Anxiolytics/Hypnotics Failed - 09/10/2022  4:10 PM      Failed - This refill cannot be delegated      Failed - Urine Drug Screen completed in last 360 days      Passed - Valid encounter  within last 6 months    Recent Outpatient Visits           1 month ago Encounter for general adult medical examination with abnormal findings   [facility], Coralie Keens, NP   8 months ago [facility] [place], Coralie Keens, NP   1 year ago Encounter for general adult medical examination with abnormal findings   [facility] [place], Coralie Keens, NP   1 year ago Adjustment disorder with depressed mood   [place], DO   1 year ago Acute gout involving toe of right foot, unspecified cause   [place], DO       Future Appointments             In 4 months Baity, Coralie Keens, NP [facility], [facility]

## 2022-09-23 ENCOUNTER — Other Ambulatory Visit: Payer: Self-pay | Admitting: Internal Medicine

## 2022-09-25 NOTE — Telephone Encounter (Signed)
Requested medications are due for refill today.  unsure  Requested medications are on the active medications list.  yes  Last refill. 08/20/2021 60g 1rf  Future visit scheduled.   yes  Notes to clinic.  Medication not assigned a protocol. Please review for refill.    Requested Prescriptions  Pending Prescriptions Disp Refills   nystatin (MYCOSTATIN/NYSTOP) powder [Pharmacy Med Name: Nystatin 100000 UNIT/GM External Powder] 60 g 0    Sig: APPLY POWDER TOPICALLY THREE TIMES DAILY     Off-Protocol Failed - 09/23/2022  1:14 PM      Failed - Medication not assigned to a protocol, review manually.      Passed - Valid encounter within last 12 months    Recent Outpatient Visits           2 months ago Encounter for general adult medical examination with abnormal findings   [place] [facility] [place], Salvadore Oxford, NP   8 months ago Prediabetes   [facility] [facility] [place], Salvadore Oxford, NP   1 year ago Encounter for general adult medical examination with abnormal findings   [place] [facility] [place], Salvadore Oxford, NP   1 year ago Adjustment disorder with depressed mood   [place] Smitty Cords, DO   1 year ago Acute gout involving toe of right foot, unspecified cause   [place] [facility] [place], Netta Neat, DO       Future Appointments             In 3 months Baity, Salvadore Oxford, NP  [facility], [facility]

## 2022-09-28 ENCOUNTER — Ambulatory Visit
Admission: RE | Admit: 2022-09-28 | Discharge: 2022-09-28 | Disposition: A | Discharge status: Home / Self Care | Payer: PPO | Source: Ambulatory Visit | Attending: Physical Medicine and Rehabilitation | Admitting: Physical Medicine and Rehabilitation

## 2022-09-28 DIAGNOSIS — M5416 Radiculopathy, lumbar region: Secondary | ICD-10-CM

## 2022-10-19 ENCOUNTER — Other Ambulatory Visit: Payer: Self-pay | Admitting: Internal Medicine

## 2022-10-19 DIAGNOSIS — F5104 Psychophysiologic insomnia: Secondary | ICD-10-CM

## 2022-10-19 DIAGNOSIS — F4321 Adjustment disorder with depressed mood: Secondary | ICD-10-CM

## 2022-10-19 NOTE — Telephone Encounter (Signed)
Requested medication (s) are due for refill today: Yes  Requested medication (s) are on the active medication list: Yes  Last refill:  09/11/22  Future visit scheduled: Yes  Notes to clinic:  See request    Requested Prescriptions  Pending Prescriptions Disp Refills   zolpidem (AMBIEN) 5 MG tablet [Pharmacy Med Name: Zolpidem Tartrate 5 MG Oral Tablet] 30 tablet 0    Sig: TAKE 1 TABLET BY MOUTH AT BEDTIME     Not Delegated - Psychiatry:  Anxiolytics/Hypnotics Failed - 10/19/2022  9:28 AM      Failed - This refill cannot be delegated      Failed - Urine Drug Screen completed in last 360 days      Passed - Valid encounter within last 6 months    Recent Outpatient Visits           3 months ago Encounter for general adult medical examination with abnormal findings   [place] [facility] [place], Salvadore Oxford, NP   9 months ago Prediabetes   [place] [facility] [place], Salvadore Oxford, NP   1 year ago Encounter for general adult medical examination with abnormal findings   [facility] [place], Salvadore Oxford, NP   1 year ago Adjustment disorder with depressed mood   [place] [facility] [place], Netta Neat, DO   1 year ago Acute gout involving toe of right foot, unspecified cause   [place] [facility] [place], Netta Neat, DO       Future Appointments             In 3 months Baity, Salvadore Oxford, NP [place] [facility]            Signed Prescriptions Disp Refills   rOPINIRole (REQUIP XL) 2 MG 24 hr tablet 90 tablet 0    Sig: TAKE 1 TABLET BY MOUTH AT BEDTIME     Neurology:  Parkinsonian Agents Passed - 10/19/2022  9:28 AM      Passed - Last BP in normal range    BP Readings from Last 1 Encounters:  07/19/22 120/68         Passed - Last Heart Rate in normal range    Pulse Readings from Last 1 Encounters:  07/19/22 68         Passed - Valid encounter  within last 12 months    Recent Outpatient Visits           3 months ago Encounter for general adult medical examination with abnormal findings   [place] [facility] [place], Salvadore Oxford, NP   9 months ago Prediabetes   [facility] [place], Salvadore Oxford, NP   1 year ago Encounter for general adult medical examination with abnormal findings   [facility] [place], Salvadore Oxford, NP   1 year ago Adjustment disorder with depressed mood   [place] [facility] [place], Netta Neat, DO   1 year ago Acute gout involving toe of right foot, unspecified cause   [place] [facility] [place], Netta Neat, DO       Future Appointments             In 3 months Baity, Salvadore Oxford, NP [facility]             omeprazole (  PRILOSEC) 20 MG capsule 90 capsule 0    Sig: Take 1 capsule by mouth once daily     Gastroenterology: Proton Pump Inhibitors Passed - 10/19/2022  9:28 AM      Passed - Valid encounter within last 12 months    Recent Outpatient Visits           3 months ago Encounter for general adult medical examination with abnormal findings   [place] [facility] [place], Salvadore Oxford, NP   9 months ago Prediabetes   [place] [facility] [place], Salvadore Oxford, NP   1 year ago Encounter for general adult medical examination with abnormal findings   [facility] [place], Salvadore Oxford, NP   1 year ago Adjustment disorder with depressed mood   [place] [facility] Smitty Cords, DO   1 year ago Acute gout involving toe of right foot, unspecified cause   [place] [facility] [place], Netta Neat, DO       Future Appointments             In 3 months Baity, Salvadore Oxford, NP [place] [facility], [facility]             buPROPion (WELLBUTRIN XL)  150 MG 24 hr tablet 90 tablet 0    Sig: Take 1 tablet by mouth once daily     Psychiatry: Antidepressants - bupropion Failed - 10/19/2022  9:28 AM      Failed - Cr in normal range and within 360 days    Creat  Date Value Ref Range Status  07/19/2022 1.16 (H) 0.60 - 1.00 mg/dL Final         Passed - AST in normal range and within 360 days    AST  Date Value Ref Range Status  07/19/2022 15 10 - 35 U/L Final         Passed - ALT in normal range and within 360 days    ALT  Date Value Ref Range Status  07/19/2022 8 6 - 29 U/L Final         Passed - Completed PHQ-2 or PHQ-9 in the last 360 days      Passed - Last BP in normal range    BP Readings from Last 1 Encounters:  07/19/22 120/68         Passed - Valid encounter within last 6 months    Recent Outpatient Visits           3 months ago Encounter for general adult medical examination with abnormal findings   [place] [facility] [place], Salvadore Oxford, NP   9 months ago Prediabetes   [place] [facility] [place], Salvadore Oxford, NP   1 year ago Encounter for general adult medical examination with abnormal findings   [place] [facility] [place], Salvadore Oxford, NP   1 year ago Adjustment disorder with depressed mood   [facility] [place], Netta Neat, DO   1 year ago Acute gout involving toe of right foot, unspecified cause    [facility] [place], Netta Neat, DO       Future Appointments             In 3 months Baity, Salvadore Oxford, NP  [facility]  traZODone (DESYREL) 50 MG tablet 90 tablet 0    Sig: TAKE 1 TABLET BY MOUTH AT BEDTIME APPOINTMENT  REQUIRED  FOR  FUTURE  REFILLS     Psychiatry: Antidepressants - Serotonin Modulator Passed - 10/19/2022  9:28 AM      Passed - Completed PHQ-2 or PHQ-9 in the last 360 days      Passed - Valid encounter within last 6 months     Recent Outpatient Visits           3 months ago Encounter for general adult medical examination with abnormal findings   [facility] [place], Salvadore Oxford, NP   9 months ago Prediabetes   [place] [facility] [place], Salvadore Oxford, NP   1 year ago Encounter for general adult medical examination with abnormal findings   [place] [facility] [place], Salvadore Oxford, NP   1 year ago Adjustment disorder with depressed mood   [place] [facility] Smitty Cords, DO   1 year ago Acute gout involving toe of right foot, unspecified cause   [place] [place], Netta Neat, DO       Future Appointments             In 3 months Baity, Salvadore Oxford, NP [place] [facility], [facility]

## 2022-10-19 NOTE — Telephone Encounter (Signed)
Requested Prescriptions  Pending Prescriptions Disp Refills   zolpidem (AMBIEN) 5 MG tablet [Pharmacy Med Name: Zolpidem Tartrate 5 MG Oral Tablet] 30 tablet 0    Sig: TAKE 1 TABLET BY MOUTH AT BEDTIME     Not Delegated - Psychiatry:  Anxiolytics/Hypnotics Failed - 10/19/2022  9:28 AM      Failed - This refill cannot be delegated      Failed - Urine Drug Screen completed in last 360 days      Passed - Valid encounter within last 6 months    Recent Outpatient Visits           3 months ago Encounter for general adult medical examination with abnormal findings   [place] [facility] [place], Salvadore Oxford, NP   9 months ago Prediabetes   [place] [facility] [place], Salvadore Oxford, NP   1 year ago Encounter for general adult medical examination with abnormal findings   [place] [facility] [place], Salvadore Oxford, NP   1 year ago Adjustment disorder with depressed mood   [place] [facility] [place], Netta Neat, DO   1 year ago Acute gout involving toe of right foot, unspecified cause   [place] [facility] [place], Netta Neat, DO       Future Appointments             In 3 months Baity, Salvadore Oxford, NP [place] [facility], [facility]             rOPINIRole (REQUIP XL) 2 MG 24 hr tablet [Pharmacy Med Name: rOPINIRole HCl ER 2 MG Oral Tablet Extended Release 24 Hour] 90 tablet 0    Sig: TAKE 1 TABLET BY MOUTH AT BEDTIME     Neurology:  Parkinsonian Agents Passed - 10/19/2022  9:28 AM      Passed - Last BP in normal range    BP Readings from Last 1 Encounters:  07/19/22 120/68         Passed - Last Heart Rate in normal range    Pulse Readings from Last 1 Encounters:  07/19/22 68         Passed - Valid encounter within last 12 months    Recent Outpatient Visits           3 months ago Encounter for general adult medical examination with abnormal findings   [place] [facility] [place], Salvadore Oxford, NP   9 months ago Prediabetes   [place] [place], Salvadore Oxford, NP   1 year ago Encounter for general adult medical examination with abnormal findings   [place] [place], Salvadore Oxford, NP   1 year ago Adjustment disorder with depressed mood   [place] [facility] [place], Netta Neat, DO   1 year ago Acute gout involving toe of right foot, unspecified cause   [place] [facility] [place], Netta Neat, DO       Future Appointments             In 3 months Baity, Salvadore Oxford, NP [place] [facility], [facility]             omeprazole (PRILOSEC) 20 MG capsule [place] Med Name: Omeprazole 20 MG Oral Capsule Delayed Release] 90 capsule 0    Sig: Take 1 capsule by mouth once daily  Gastroenterology: Proton Pump Inhibitors Passed - 10/19/2022  9:28 AM      Passed - Valid encounter within last 12 months    Recent Outpatient Visits           3 months ago Encounter for general adult medical examination with abnormal findings   [facility], Salvadore Oxford, NP   9 months ago Prediabetes   [place] [facility] [place], Salvadore Oxford, NP   1 year ago Encounter for general adult medical examination with abnormal findings   [place] [facility] [place], Salvadore Oxford, NP   1 year ago Adjustment disorder with depressed mood   [place] Smitty Cords, DO   1 year ago Acute gout involving toe of right foot, unspecified cause   [place] [facility] [place], Netta Neat, DO       Future Appointments             In 3 months Baity, Salvadore Oxford, NP [place] [facility], [facility]             buPROPion (WELLBUTRIN XL) 150 MG 24 hr tablet [Pharmacy Med Name: buPROPion HCl ER (XL) 150 MG Oral Tablet Extended Release 24  Hour] 90 tablet 0    Sig: Take 1 tablet by mouth once daily     Psychiatry: Antidepressants - bupropion Failed - 10/19/2022  9:28 AM      Failed - Cr in normal range and within 360 days    Creat  Date Value Ref Range Status  07/19/2022 1.16 (H) 0.60 - 1.00 mg/dL Final         Passed - AST in normal range and within 360 days    AST  Date Value Ref Range Status  07/19/2022 15 10 - 35 U/L Final         Passed - ALT in normal range and within 360 days    ALT  Date Value Ref Range Status  07/19/2022 8 6 - 29 U/L Final         Passed - Completed PHQ-2 or PHQ-9 in the last 360 days      Passed - Last BP in normal range    BP Readings from Last 1 Encounters:  07/19/22 120/68         Passed - Valid encounter within last 6 months    Recent Outpatient Visits           3 months ago Encounter for general adult medical examination with abnormal findings   [place] [facility] [place], Salvadore Oxford, NP   9 months ago Prediabetes   [place] [facility] [place], Salvadore Oxford, NP   1 year ago Encounter for general adult medical examination with abnormal findings   [place] [facility] [place], Salvadore Oxford, NP   1 year ago Adjustment disorder with depressed mood   [place] [facility] [place], Netta Neat, DO   1 year ago Acute gout involving toe of right foot, unspecified cause   [place] [facility] Smitty Cords, DO       Future Appointments             In 3 months Baity, Salvadore Oxford, NP  [facility], [facility]             traZODone (DESYREL) 50 MG  tablet [Pharmacy Med Name: traZODone HCl 50 MG Oral Tablet] 90 tablet 0    Sig: TAKE 1 TABLET BY MOUTH AT BEDTIME APPOINTMENT  REQUIRED  FOR  FUTURE  REFILLS     Psychiatry: Antidepressants - Serotonin Modulator Passed - 10/19/2022  9:28 AM      Passed - Completed PHQ-2 or PHQ-9 in the last 360 days      Passed -  Valid encounter within last 6 months    Recent Outpatient Visits           3 months ago Encounter for general adult medical examination with abnormal findings   [place] [facility] [place], Salvadore Oxford, NP   9 months ago Prediabetes   [facility] [place], Salvadore Oxford, NP   1 year ago Encounter for general adult medical examination with abnormal findings   [place] [facility] [place], Salvadore Oxford, NP   1 year ago Adjustment disorder with depressed mood   [place] [facility] Smitty Cords, DO   1 year ago Acute gout involving toe of right foot, unspecified cause   [place] [facility] [place], Netta Neat, DO       Future Appointments             In 3 months Baity, Salvadore Oxford, NP [place] [facility], [facility]

## 2022-11-24 ENCOUNTER — Other Ambulatory Visit: Payer: Self-pay | Admitting: Internal Medicine

## 2022-11-24 DIAGNOSIS — F5104 Psychophysiologic insomnia: Secondary | ICD-10-CM

## 2022-11-26 NOTE — Telephone Encounter (Signed)
Requested medication (s) are due for refill today: yes  Requested medication (s) are on the active medication list: yes  Last refill:  10/22/22  Future visit scheduled: yes  Notes to clinic:  Unable to refill per protocol, cannot delegate.      Requested Prescriptions  Pending Prescriptions Disp Refills   zolpidem (AMBIEN) 5 MG tablet [Pharmacy Med Name: Zolpidem Tartrate 5 MG Oral Tablet] 30 tablet 0    Sig: TAKE 1 TABLET BY MOUTH AT BEDTIME     Not Delegated - Psychiatry:  Anxiolytics/Hypnotics Failed - 11/24/2022 11:45 AM      Failed - This refill cannot be delegated      Failed - Urine Drug Screen completed in last 360 days      Passed - Valid encounter within last 6 months    Recent Outpatient Visits           4 months ago Encounter for general adult medical examination with abnormal findings   [place] [facility] [place], Salvadore Oxford, NP   10 months ago Prediabetes   [place] [facility] [place], Salvadore Oxford, NP   1 year ago Encounter for general adult medical examination with abnormal findings   [place] [facility] [place], Salvadore Oxford, NP   1 year ago Adjustment disorder with depressed mood   [place] [facility] Smitty Cords, DO   1 year ago Acute gout involving toe of right foot, unspecified cause   [place] [facility] [place], Netta Neat, DO       Future Appointments             In 1 month Baity, Salvadore Oxford, NP [place] [facility], [facility]

## 2022-12-26 ENCOUNTER — Other Ambulatory Visit: Payer: Self-pay | Admitting: Internal Medicine

## 2022-12-26 DIAGNOSIS — F5104 Psychophysiologic insomnia: Secondary | ICD-10-CM

## 2022-12-27 NOTE — Telephone Encounter (Signed)
Requested medication (s) are due for refill today -yes  Requested medication (s) are on the active medication list -yes  Future visit scheduled -yes  Last refill: 11/26/22 #30  Notes to clinic: non delegated Rx  Requested Prescriptions  Pending Prescriptions Disp Refills   zolpidem (AMBIEN) 5 MG tablet [Pharmacy Med Name: Zolpidem Tartrate 5 MG Oral Tablet] 30 tablet 0    Sig: TAKE 1 TABLET BY MOUTH AT BEDTIME     Not Delegated - Psychiatry:  Anxiolytics/Hypnotics Failed - 12/26/2022  6:29 PM      Failed - This refill cannot be delegated      Failed - Urine Drug Screen completed in last 360 days      Passed - Valid encounter within last 6 months    Recent Outpatient Visits           5 months ago Encounter for general adult medical examination with abnormal findings   [facility] [place], Salvadore Oxford, NP   11 months ago Prediabetes   [facility] [place], Salvadore Oxford, NP   1 year ago Encounter for general adult medical examination with abnormal findings   [place] [facility] [place], Salvadore Oxford, NP   1 year ago Adjustment disorder with depressed mood   [place] [facility] [place], Netta Neat, DO   2 years ago Acute gout involving toe of right foot, unspecified cause   [place] [facility] [place], Netta Neat, DO       Future Appointments             In 3 weeks Baity, Salvadore Oxford, NP [place] [facility], [facility]               Requested Prescriptions  Pending Prescriptions Disp Refills   zolpidem (AMBIEN) 5 MG tablet [Pharmacy Med Name: Zolpidem Tartrate 5 MG Oral Tablet] 30 tablet 0    Sig: TAKE 1 TABLET BY MOUTH AT BEDTIME     Not Delegated - Psychiatry:  Anxiolytics/Hypnotics Failed - 12/26/2022  6:29 PM      Failed - This refill cannot be delegated      Failed - Urine Drug Screen completed in last 360 days      Passed - Valid encounter  within last 6 months    Recent Outpatient Visits           5 months ago Encounter for general adult medical examination with abnormal findings   [place] [facility] [place], Salvadore Oxford, NP   11 months ago Prediabetes   [place] [facility] [place], Salvadore Oxford, NP   1 year ago Encounter for general adult medical examination with abnormal findings   [place] [facility] [place], Salvadore Oxford, NP   1 year ago Adjustment disorder with depressed mood   [place] [facility] [place], Netta Neat, DO   2 years ago Acute gout involving toe of right foot, unspecified cause   [place] [facility] [place], Netta Neat, DO       Future Appointments             In 3 weeks Baity, Salvadore Oxford, NP [place] [facility], [facility]

## 2023-01-17 ENCOUNTER — Ambulatory Visit: Payer: PPO | Admitting: Internal Medicine

## 2023-01-18 ENCOUNTER — Other Ambulatory Visit: Payer: Self-pay | Admitting: Internal Medicine

## 2023-01-18 DIAGNOSIS — F4321 Adjustment disorder with depressed mood: Secondary | ICD-10-CM

## 2023-01-18 DIAGNOSIS — F329 Major depressive disorder, single episode, unspecified: Secondary | ICD-10-CM

## 2023-01-18 DIAGNOSIS — F5104 Psychophysiologic insomnia: Secondary | ICD-10-CM

## 2023-01-18 NOTE — Telephone Encounter (Signed)
Called pt to make appt. Pt will call us back.

## 2023-01-18 NOTE — Telephone Encounter (Signed)
Courtesy refill. Patient will need an office visit for further refills. Requested Prescriptions  Pending Prescriptions Disp Refills   citalopram (CELEXA) 20 MG tablet [Pharmacy Med Name: Citalopram Hydrobromide 20 MG Oral Tablet] 30 tablet 0    Sig: Take 1 tablet by mouth once daily     Psychiatry:  Antidepressants - SSRI Failed - 01/18/2023  6:50 AM      Failed - Valid encounter within last 6 months    Recent Outpatient Visits           6 months ago Encounter for general adult medical examination with abnormal findings   [place] [place], Salvadore Oxford, NP   1 year ago Prediabetes   [place] [facility] [place], Salvadore Oxford, NP   1 year ago Encounter for general adult medical examination with abnormal findings   [place] [facility], Salvadore Oxford, NP   1 year ago Adjustment disorder with depressed mood   [place] [facility] [place], Netta Neat, DO   2 years ago Acute gout involving toe of right foot, unspecified cause   [facility] [place], Netta Neat, DO              Passed - Completed PHQ-2 or PHQ-9 in the last 360 days

## 2023-01-21 NOTE — Telephone Encounter (Signed)
Requested Prescriptions  Pending Prescriptions Disp Refills   rOPINIRole (REQUIP XL) 2 MG 24 hr tablet [Pharmacy Med Name: rOPINIRole HCl ER 2 MG Oral Tablet Extended Release 24 Hour] 90 tablet 0    Sig: TAKE 1 TABLET BY MOUTH AT BEDTIME     Neurology:  Parkinsonian Agents Passed - 01/18/2023  8:31 PM      Passed - Last BP in normal range    BP Readings from Last 1 Encounters:  07/19/22 120/68         Passed - Last Heart Rate in normal range    Pulse Readings from Last 1 Encounters:  07/19/22 68         Passed - Valid encounter within last 12 months    Recent Outpatient Visits           6 months ago Encounter for general adult medical examination with abnormal findings   [place] [facility] [place], Salvadore Oxford, NP   1 year ago Prediabetes   [place] [facility] [place], Salvadore Oxford, NP   1 year ago Encounter for general adult medical examination with abnormal findings   [place] [facility] [place], Salvadore Oxford, NP   1 year ago Adjustment disorder with depressed mood   [place] [facility] [place], Netta Neat, DO   2 years ago Acute gout involving toe of right foot, unspecified cause   [place] [facility] [place], Netta Neat, DO       Future Appointments             In 1 week Sampson Si, Salvadore Oxford, NP [place] [facility]             traZODone (DESYREL) 50 MG tablet [Pharmacy Med Name: traZODone HCl 50 MG Oral Tablet] 90 tablet 0    Sig: TAKE 1 TABLET BY MOUTH AT BEDTIME. APPOINTMENT NEEDED FOR FURTHER REFILLS.     Psychiatry: Antidepressants - Serotonin Modulator Failed - 01/18/2023  8:31 PM      Failed - Valid encounter within last 6 months    Recent Outpatient Visits           6 months ago Encounter for general adult medical examination with abnormal findings   [place] [facility] [place], Salvadore Oxford, NP   1 year ago Prediabetes   [facility] [place], Salvadore Oxford, NP   1 year ago Encounter for general adult medical examination with abnormal findings   [facility] [place], Salvadore Oxford, NP   1 year ago Adjustment disorder with depressed mood   [facility] [place], Netta Neat, DO   2 years ago Acute gout involving toe of right foot, unspecified cause    [facility] [place], Netta Neat, DO       Future Appointments             In 1 week Sampson Si, Salvadore Oxford, NP  [facility]            Passed - Completed PHQ-2 or PHQ-9 in the last 360 days       citalopram (CELEXA) 20 MG tablet [Pharmacy Med Name: Citalopram Hydrobromide 20 MG Oral Tablet] 90 tablet 0    Sig: Take 1 tablet by mouth once daily     Psychiatry:  Antidepressants - SSRI Failed -  01/18/2023  8:31 PM      Failed - Valid encounter within last 6 months    Recent Outpatient Visits           6 months ago Encounter for general adult medical examination with abnormal findings   [place] [facility] [place], Salvadore Oxford, NP   1 year ago Prediabetes   [place] [facility] [place], Salvadore Oxford, NP   1 year ago Encounter for general adult medical examination with abnormal findings   [place] [facility] [place], Salvadore Oxford, NP   1 year ago Adjustment disorder with depressed mood   [place] [facility] [place], DO   2 years ago Acute gout involving toe of right foot, unspecified cause   [facility] [place], Netta Neat, DO       Future Appointments             In 1 week Sampson Si, Salvadore Oxford, NP [place] [facility]            Passed - Completed PHQ-2 or PHQ-9 in the last 360 days       buPROPion (WELLBUTRIN XL) 150 MG 24 hr tablet [Pharmacy Med Name: buPROPion HCl ER (XL) 150 MG Oral  Tablet Extended Release 24 Hour] 90 tablet 0    Sig: Take 1 tablet by mouth once daily     Psychiatry: Antidepressants - bupropion Failed - 01/18/2023  8:31 PM      Failed - Cr in normal range and within 360 days    Creat  Date Value Ref Range Status  07/19/2022 1.16 (H) 0.60 - 1.00 mg/dL Final         Failed - Valid encounter within last 6 months    Recent Outpatient Visits           6 months ago Encounter for general adult medical examination with abnormal findings   [place] [facility] [place], Salvadore Oxford, NP   1 year ago Prediabetes   [place] [facility] [place], [place] W, NP   1 year ago Encounter for general adult medical examination with abnormal findings   [place] [facility] [place], Salvadore Oxford, NP   1 year ago Adjustment disorder with depressed mood   [place] [facility] [place], Netta Neat, DO   2 years ago Acute gout involving toe of right foot, unspecified cause   [facility] [place], Netta Neat, DO       Future Appointments             In 1 week Sampson Si, Salvadore Oxford, NP  [facility]            Passed - AST in normal range and within 360 days    AST  Date Value Ref Range Status  07/19/2022 15 10 - 35 U/L Final         Passed - ALT in normal range and within 360 days    ALT  Date Value Ref Range Status  07/19/2022 8 6 - 29 U/L Final         Passed - Completed PHQ-2 or PHQ-9 in the last 360 days      Passed - Last BP in normal range    BP Readings from Last 1 Encounters:  07/19/22 120/68

## 2023-01-22 ENCOUNTER — Other Ambulatory Visit: Payer: Self-pay | Admitting: Internal Medicine

## 2023-01-22 DIAGNOSIS — F5104 Psychophysiologic insomnia: Secondary | ICD-10-CM

## 2023-01-24 NOTE — Telephone Encounter (Signed)
Requested medications are due for refill today.  yes  Requested medications are on the active medications list.  yes  Last refill. 12/27/2022 #30 0 rf  Future visit scheduled.   yes  Notes to clinic.  Refill not delegated.    Requested Prescriptions  Pending Prescriptions Disp Refills   zolpidem (AMBIEN) 5 MG tablet [Pharmacy Med Name: Zolpidem Tartrate 5 MG Oral Tablet] 30 tablet 0    Sig: TAKE 1 TABLET BY MOUTH AT BEDTIME     Not Delegated - Psychiatry:  Anxiolytics/Hypnotics Failed - 01/22/2023  2:30 PM      Failed - This refill cannot be delegated      Failed - Urine Drug Screen completed in last 360 days      Failed - Valid encounter within last 6 months    Recent Outpatient Visits           6 months ago Encounter for general adult medical examination with abnormal findings   [place] [facility] [place], Salvadore Oxford, NP   1 year ago Prediabetes   [place] [facility] [place], Salvadore Oxford, NP   1 year ago Encounter for general adult medical examination with abnormal findings   [place] [facility] [place], Salvadore Oxford, NP   1 year ago Adjustment disorder with depressed mood   [place] [facility] [place], Netta Neat, DO   2 years ago Acute gout involving toe of right foot, unspecified cause   [place] [facility] [place], Netta Neat, DO       Future Appointments             In 1 week Sampson Si, Salvadore Oxford, NP [facility]            Refused Prescriptions Disp Refills   buPROPion (WELLBUTRIN XL) 150 MG 24 hr tablet [Pharmacy Med Name: buPROPion HCl ER (XL) 150 MG Oral Tablet Extended Release 24 Hour] 90 tablet 0    Sig: Take 1 tablet by mouth once daily     Psychiatry: Antidepressants - bupropion Failed - 01/22/2023  2:30 PM      Failed - Cr in normal range and within 360 days    Creat  Date Value Ref Range Status  07/19/2022 1.16 (H) 0.60 -  1.00 mg/dL Final         Failed - Valid encounter within last 6 months    Recent Outpatient Visits           6 months ago Encounter for general adult medical examination with abnormal findings   [place] [facility] [place], Salvadore Oxford, NP   1 year ago Prediabetes   [place] [facility] [place], Salvadore Oxford, NP   1 year ago Encounter for general adult medical examination with abnormal findings   [place] [facility] [place], Salvadore Oxford, NP   1 year ago Adjustment disorder with depressed mood   [place] [facility] [place], Netta Neat, DO   2 years ago Acute gout involving toe of right foot, unspecified cause   [place] [facility] [place], Netta Neat, DO       Future Appointments             In 1 week Sampson Si, Salvadore Oxford, NP [place] [facility]  Passed - AST in normal range and within 360 days    AST  Date Value Ref Range Status  07/19/2022 15 10 - 35 U/L Final         Passed - ALT in normal range and within 360 days    ALT  Date Value Ref Range Status  07/19/2022 8 6 - 29 U/L Final         Passed - Completed PHQ-2 or PHQ-9 in the last 360 days      Passed - Last BP in normal range    BP Readings from Last 1 Encounters:  07/19/22 120/68

## 2023-01-24 NOTE — Telephone Encounter (Signed)
Requested Prescriptions  Pending Prescriptions Disp Refills   zolpidem (AMBIEN) 5 MG tablet [Pharmacy Med Name: Zolpidem Tartrate 5 MG Oral Tablet] 30 tablet 0    Sig: TAKE 1 TABLET BY MOUTH AT BEDTIME     Not Delegated - Psychiatry:  Anxiolytics/Hypnotics Failed - 01/22/2023  2:30 PM      Failed - This refill cannot be delegated      Failed - Urine Drug Screen completed in last 360 days      Failed - Valid encounter within last 6 months    Recent Outpatient Visits           6 months ago Encounter for general adult medical examination with abnormal findings   [place] [facility] [place], Salvadore Oxford, NP   1 year ago Prediabetes   [place] [facility], Salvadore Oxford, NP   1 year ago Encounter for general adult medical examination with abnormal findings   [place] [facility] [place], Salvadore Oxford, NP   1 year ago Adjustment disorder with depressed mood   [place] [facility] [place], Netta Neat, DO   2 years ago Acute gout involving toe of right foot, unspecified cause   [place] [facility] [place], Netta Neat, DO       Future Appointments             In 1 week Sampson Si, Salvadore Oxford, NP [facility]            Refused Prescriptions Disp Refills   buPROPion (WELLBUTRIN XL) 150 MG 24 hr tablet [Pharmacy Med Name: buPROPion HCl ER (XL) 150 MG Oral Tablet Extended Release 24 Hour] 90 tablet 0    Sig: Take 1 tablet by mouth once daily     Psychiatry: Antidepressants - bupropion Failed - 01/22/2023  2:30 PM      Failed - Cr in normal range and within 360 days    Creat  Date Value Ref Range Status  07/19/2022 1.16 (H) 0.60 - 1.00 mg/dL Final         Failed - Valid encounter within last 6 months    Recent Outpatient Visits           6 months ago Encounter for general adult medical examination with abnormal findings   [place] [facility] [place], Salvadore Oxford, NP   1 year ago Prediabetes   [place] [facility] [place], Salvadore Oxford, NP   1 year ago Encounter for general adult medical examination with abnormal findings   [facility] [place], Salvadore Oxford, NP   1 year ago Adjustment disorder with depressed mood   [place] [facility], Netta Neat, DO   2 years ago Acute gout involving toe of right foot, unspecified cause   [place] [facility] [place], Netta Neat, DO       Future Appointments             In 1 week Lorre Munroe, NP  [facility]            Passed - AST in normal range and within 360 days    AST  Date Value Ref Range Status  07/19/2022 15 10 - 35 U/L Final         Passed - ALT in normal range and within 360  days    ALT  Date Value Ref Range Status  07/19/2022 8 6 - 29 U/L Final         Passed - Completed PHQ-2 or PHQ-9 in the last 360 days      Passed - Last BP in normal range    BP Readings from Last 1 Encounters:  07/19/22 120/68

## 2023-02-01 ENCOUNTER — Ambulatory Visit: Payer: PPO | Admitting: Internal Medicine

## 2023-02-14 ENCOUNTER — Ambulatory Visit (INDEPENDENT_AMBULATORY_CARE_PROVIDER_SITE_OTHER): Payer: PPO | Admitting: Internal Medicine

## 2023-02-14 ENCOUNTER — Encounter: Payer: Self-pay | Admitting: Internal Medicine

## 2023-02-14 VITALS — BP 132/78 | HR 72 | Temp 96.5°F | Wt 206.0 lb

## 2023-02-14 DIAGNOSIS — R7303 Prediabetes: Secondary | ICD-10-CM | POA: Diagnosis not present

## 2023-02-14 DIAGNOSIS — Z23 Encounter for immunization: Secondary | ICD-10-CM

## 2023-02-14 DIAGNOSIS — I700 Atherosclerosis of aorta: Secondary | ICD-10-CM

## 2023-02-14 DIAGNOSIS — S81831A Puncture wound without foreign body, right lower leg, initial encounter: Secondary | ICD-10-CM

## 2023-02-14 DIAGNOSIS — M150 Primary generalized (osteo)arthritis: Secondary | ICD-10-CM

## 2023-02-14 DIAGNOSIS — Z8673 Personal history of transient ischemic attack (TIA), and cerebral infarction without residual deficits: Secondary | ICD-10-CM

## 2023-02-14 DIAGNOSIS — J45998 Other asthma: Secondary | ICD-10-CM

## 2023-02-14 DIAGNOSIS — M159 Polyosteoarthritis, unspecified: Secondary | ICD-10-CM

## 2023-02-14 DIAGNOSIS — F329 Major depressive disorder, single episode, unspecified: Secondary | ICD-10-CM

## 2023-02-14 DIAGNOSIS — E6609 Other obesity due to excess calories: Secondary | ICD-10-CM

## 2023-02-14 DIAGNOSIS — I10 Essential (primary) hypertension: Secondary | ICD-10-CM

## 2023-02-14 DIAGNOSIS — G2581 Restless legs syndrome: Secondary | ICD-10-CM

## 2023-02-14 DIAGNOSIS — F5104 Psychophysiologic insomnia: Secondary | ICD-10-CM

## 2023-02-14 DIAGNOSIS — I82561 Chronic embolism and thrombosis of right calf muscular vein: Secondary | ICD-10-CM

## 2023-02-14 DIAGNOSIS — Z6834 Body mass index (BMI) 34.0-34.9, adult: Secondary | ICD-10-CM

## 2023-02-14 DIAGNOSIS — D692 Other nonthrombocytopenic purpura: Secondary | ICD-10-CM | POA: Insufficient documentation

## 2023-02-14 DIAGNOSIS — E782 Mixed hyperlipidemia: Secondary | ICD-10-CM

## 2023-02-14 DIAGNOSIS — K219 Gastro-esophageal reflux disease without esophagitis: Secondary | ICD-10-CM

## 2023-02-14 MED ORDER — ASPIRIN 81 MG PO TBEC: 81.0000 mg | DELAYED_RELEASE_TABLET | Freq: Every day | ORAL | Status: AC

## 2023-02-14 MED ORDER — ZOLPIDEM TARTRATE ER 6.25 MG PO TBCR: 6.2500 mg | EXTENDED_RELEASE_TABLET | Freq: Every evening | ORAL | 0 refills | Status: DC | PRN

## 2023-02-14 NOTE — Assessment & Plan Note (Addendum)
Avoid foods that trigger reflux Encourage weight loss as this can reduce reflux symptoms Continue omeprazole and Tums as needed

## 2023-02-14 NOTE — Assessment & Plan Note (Signed)
Will change Ambien 5 mg to Ambien CR 6.25 mg Try to take this without the trazodone but if unsuccessful okay to take with trazodone

## 2023-02-14 NOTE — Assessment & Plan Note (Signed)
Encouraged diet and exercise for weight loss ?

## 2023-02-14 NOTE — Assessment & Plan Note (Signed)
Continue albuterol as needed 

## 2023-02-14 NOTE — Assessment & Plan Note (Signed)
CBC today.  

## 2023-02-14 NOTE — Assessment & Plan Note (Signed)
Stable on citalopram and bupropion Support offered

## 2023-02-14 NOTE — Assessment & Plan Note (Signed)
Continue hydrocodone and gabapentin for pain management

## 2023-02-14 NOTE — Assessment & Plan Note (Signed)
C-Met and lipid profile today Not currently on statin therapy but discussed possibly trying this 2-3 times weekly Continue aspirin Encourage low-fat diet

## 2023-02-14 NOTE — Assessment & Plan Note (Signed)
C-Met and lipid profile today Not currently on statin therapy but discussed possibly trying this 2-3 times weekly Encourage low-fat diet

## 2023-02-14 NOTE — Assessment & Plan Note (Signed)
Continue gabapentin and ropinirole

## 2023-02-14 NOTE — Progress Notes (Signed)
Subjective:    Patient ID: Linda Farley, female    DOB: 1951/03/21, 72 y.o.   MRN: [id number]  HPI  Patient presents to clinic today for 24-month follow-up of chronic conditions.  HTN: Her BP today is 132/78.  She is taking lisinopril and furosemide as prescribed.  ECG from 12/2019 reviewed.  HLD with aortic atherosclerosis status post stroke: Her last LDL was 116, triglycerides 95, 07/2022.  She is not taking any cholesterol-lowering medication at this time.  She is taking aspirin.  She tries to consume a low-fat diet.  She does not follow with neurology.  Chronic DVT: Managed with aspirin daily.  She follows with vascular.  Depression: Chronic, managed on citalopram and bupropion.  She is not currently seeing a therapist.  She denies anxiety, SI/HI.  OA: Mainly in her back and shoulders.  She takes gabapentin and hydrocodone as prescribed with some relief of symptoms.  She follows with pain management.  GERD: She is not sure what triggers this.  She has occasional breakthrough on omeprazole for which she takes tums as needed with good relief of symptoms.  There is no upper GI on file.  Prediabetes: Her last A1c was 6%, 07/2022.  She is not taking any oral diabetic medication at this time.  She does not check her sugars.  Asthma: Seasonal.  Managed with albuterol as needed.  There are no PFTs on file.  She does not follow with pulmonology or an allergist.  RLS: Managed with gabapentin and ropinirole.  She does not follow with neurology.  Insomnia: She has difficulty falling and staying asleep.  She is taking trazodone and ambien as prescribed.  Sleep study from 07/2019 reviewed.  Review of Systems     Past Medical History:  Diagnosis Date   Allergy    Anxiety    Arthritis    Depression    Hypertension     Current Outpatient Medications  Medication Sig Dispense Refill   albuterol (VENTOLIN HFA) 108 (90 Base) MCG/ACT inhaler INHALE 2 PUFFS BY MOUTH EVERY 6 HOURS AS NEEDED FOR  WHEEZING OR SHORTNESS OF BREATH 9 g 2   buPROPion (WELLBUTRIN XL) 150 MG 24 hr tablet Take 1 tablet by mouth once daily 90 tablet 0   calcium carbonate (TUMS - DOSED IN MG ELEMENTAL CALCIUM) 500 MG chewable tablet Chew 1 tablet by mouth daily.     Cholecalciferol (D3 SUPER STRENGTH) 50 MCG (2000 UT) CAPS Take by mouth.     citalopram (CELEXA) 20 MG tablet Take 1 tablet by mouth once daily 30 tablet 0   furosemide (LASIX) 20 MG tablet Take 1 tablet by mouth once daily 90 tablet 1   gabapentin (NEURONTIN) 300 MG capsule Take 1 capsule (300 mg total) by mouth as directed. One capsule in the morning and two capsules every day at bedtime 270 capsule 1   HYDROcodone-acetaminophen (NORCO) 7.5-325 MG tablet      lisinopril (ZESTRIL) 20 MG tablet Take 1 tablet by mouth once daily 90 tablet 1   nystatin (MYCOSTATIN/NYSTOP) powder APPLY POWDER TOPICALLY THREE TIMES DAILY 60 g 0   omeprazole (PRILOSEC) 20 MG capsule Take 1 capsule by mouth once daily 90 capsule 0   rOPINIRole (REQUIP XL) 2 MG 24 hr tablet TAKE 1 TABLET BY MOUTH AT BEDTIME 90 tablet 0   traZODone (DESYREL) 50 MG tablet TAKE 1 TABLET BY MOUTH AT BEDTIME. APPOINTMENT NEEDED FOR FURTHER REFILLS. 90 tablet 0   zolpidem (AMBIEN) 5 MG tablet TAKE 1  TABLET BY MOUTH AT BEDTIME 30 tablet 0   No current facility-administered medications for this visit.    Allergies  Allergen Reactions   Chlorhexidine    Penicillins Rash    Family History  Problem Relation Age of Onset   Prostate cancer Father    Bone cancer Brother    Heart disease Brother     Social History   Socioeconomic History   Marital status: Married    Spouse name: Not on file   Number of children: Not on file   Years of education: Not on file   Highest education level: Not on file  Occupational History   Not on file  Tobacco Use   Smoking status: Never   Smokeless tobacco: Never  Vaping Use   Vaping status: Never Used  Substance and Sexual Activity   Alcohol use: Yes     Alcohol/week: 1.0 standard drink of alcohol    Types: 1 Glasses of wine per week    Comment: 3 wine coolers weekly   Drug use: Never   Sexual activity: Not on file  Other Topics Concern   Not on file  Social History Narrative   Not on file   Social Determinants of Health   Financial Resource Strain: Not on file  Food Insecurity: Not on file  Transportation Needs: Not on file  Physical Activity: Not on file  Stress: Not on file  Social Connections: Not on file  Intimate Partner Violence: Not on file     Constitutional: Denies fever, malaise, fatigue, headache or abrupt weight changes.  HEENT: Denies eye pain, eye redness, ear pain, ringing in the ears, wax buildup, runny nose, nasal congestion, bloody nose, or sore throat. Respiratory: Denies difficulty breathing, shortness of breath, cough or sputum production.   Cardiovascular: Denies chest pain, chest tightness, palpitations or swelling in the hands or feet.  Gastrointestinal: Denies abdominal pain, bloating, constipation, diarrhea or blood in the stool.  GU: Denies urgency, frequency, pain with urination, burning sensation, blood in urine, odor or discharge. Musculoskeletal: Patient reports joint pain.  Denies decrease in range of motion, difficulty with gait, muscle pain or joint swelling.  Skin: Pt reports intermittent weeping of legs. Denies redness, rashes, lesions or ulcercations.  Neurological: Patient reports left side facial drooping, insomnia and restless legs.  Denies dizziness, difficulty with memory, difficulty with speech or problems with balance and coordination.  Psych: Patient has a history of depression.  Denies anxiety, SI/HI.  No other specific complaints in a complete review of systems (except as listed in HPI above).  Objective:   Physical Exam  BP 132/78 (BP Location: Right Arm, Patient Position: Sitting, Cuff Size: Normal)   Pulse 72   Temp (!) 96.5 F (35.8 C) (Temporal)   Wt 206 lb (93.4 kg)    SpO2 97%   BMI 34.28 kg/m   Wt Readings from Last 3 Encounters:  07/19/22 206 lb (93.4 kg)  01/09/22 208 lb (94.3 kg)  05/09/21 203 lb (92.1 kg)    General: Appears her stated age, obese, in NAD. Skin: Warm, dry and intact.  Scabbed puncture wound noted to right anterior shin. Senile purpura noted. HEENT: Head: normal shape and size; Eyes: sclera white, no icterus, conjunctiva pink, PERRLA and EOMs intact;  Cardiovascular: Normal rate and rhythm. S1,S2 noted.  No murmur, rubs or gallops noted. No JVD or BLE edema.  I am concerned that she has lymphedema of her lower extremities.  No carotid bruits noted. Pulmonary/Chest: Normal effort and  positive vesicular breath sounds. No respiratory distress. No wheezes, rales or ronchi noted.  Abdomen: Soft and nontender. Normal bowel sounds.  Musculoskeletal: Gait slow and steady without device. Neurological: Alert and oriented. Cranial nerves II-XII grossly intact. Coordination normal.  Psychiatric: Mood and affect normal. Behavior is normal. Judgment and thought content normal.     BMET    Component Value Date/Time   NA 142 07/19/2022 1103   K 4.5 07/19/2022 1103   CL 102 07/19/2022 1103   CO2 28 07/19/2022 1103   GLUCOSE 91 07/19/2022 1103   BUN 20 07/19/2022 1103   CREATININE 1.16 (H) 07/19/2022 1103   CALCIUM 9.6 07/19/2022 1103   GFRNONAA >60 12/23/2019 0508   GFRAA >60 12/23/2019 0508    Lipid Panel     Component Value Date/Time   CHOL 204 (H) 07/19/2022 1103   TRIG 95 07/19/2022 1103   HDL 68 07/19/2022 1103   CHOLHDL 3.0 07/19/2022 1103   VLDL 16 12/19/2019 0552   LDLCALC 116 (H) 07/19/2022 1103    CBC    Component Value Date/Time   WBC 5.8 07/19/2022 1103   RBC 4.71 07/19/2022 1103   HGB 14.1 07/19/2022 1103   HCT 41.5 07/19/2022 1103   PLT 192 07/19/2022 1103   MCV 88.1 07/19/2022 1103   MCH 29.9 07/19/2022 1103   MCHC 34.0 07/19/2022 1103   RDW 14.3 07/19/2022 1103   LYMPHSABS 2,651 09/03/2019 0928    EOSABS 74 09/03/2019 0928   BASOSABS 74 09/03/2019 0928    Hgb A1C Lab Results  Component Value Date   HGBA1C 6.0 (H) 07/19/2022          Assessment & Plan:   Puncture wound right leg:  No evidence of infection and signs of healing Tdap given  RTC in 5 months for annual exam Nicki Reaper, NP

## 2023-02-14 NOTE — Assessment & Plan Note (Signed)
Continue aspirin 

## 2023-02-14 NOTE — Assessment & Plan Note (Signed)
A1c today Encourage low-carb diet and exercise for weight loss 

## 2023-02-14 NOTE — Assessment & Plan Note (Signed)
Controlled on lisinopril and furosemide Given intermittent weeping in legs, will check kidney function today and if stable, can consider taking as needed dose of furosemide when weeping occurs Reinforced DASH diet and exercise for weight loss C-Met today

## 2023-02-15 ENCOUNTER — Other Ambulatory Visit: Payer: Self-pay | Admitting: Internal Medicine

## 2023-02-15 DIAGNOSIS — J45998 Other asthma: Secondary | ICD-10-CM

## 2023-02-18 NOTE — Telephone Encounter (Signed)
Requested Prescriptions  Pending Prescriptions Disp Refills   albuterol (VENTOLIN HFA) 108 (90 Base) MCG/ACT inhaler [Pharmacy Med Name: Albuterol Sulfate HFA 108 (90 Base) MCG/ACT Inhalation Aerosol Solution] 18 g 0    Sig: INHALE 2 PUFFS BY MOUTH EVERY 6 HOURS AS NEEDED FOR WHEEZING OR SHORTNESS OF BREATH     Pulmonology:  Beta Agonists 2 Passed - 02/15/2023  2:56 PM      Passed - Last BP in normal range    BP Readings from Last 1 Encounters:  02/14/23 132/78         Passed - Last Heart Rate in normal range    Pulse Readings from Last 1 Encounters:  02/14/23 72         Passed - Valid encounter within last 12 months    Recent Outpatient Visits           4 days ago Aortic atherosclerosis [facility])   [place] [facility] [place], Salvadore Oxford, NP   7 months ago Encounter for general adult medical examination with abnormal findings   [place] [facility] [place], Salvadore Oxford, NP   1 year ago Prediabetes   [place] [place], Salvadore Oxford, NP   1 year ago Encounter for general adult medical examination with abnormal findings   [place] [facility] [place], Salvadore Oxford, NP   1 year ago Adjustment disorder with depressed mood   [place] [facility] Smitty Cords, DO       Future Appointments             In 5 months Baity, Salvadore Oxford, NP [place] [facility], [facility]

## 2023-02-20 ENCOUNTER — Other Ambulatory Visit: Payer: Self-pay | Admitting: Internal Medicine

## 2023-02-20 DIAGNOSIS — F329 Major depressive disorder, single episode, unspecified: Secondary | ICD-10-CM

## 2023-02-21 ENCOUNTER — Other Ambulatory Visit: Payer: PPO

## 2023-02-21 NOTE — Telephone Encounter (Signed)
Requested Prescriptions  Pending Prescriptions Disp Refills   citalopram (CELEXA) 20 MG tablet [Pharmacy Med Name: Citalopram Hydrobromide 20 MG Oral Tablet] 30 tablet 0    Sig: Take 1 tablet by mouth once daily     Psychiatry:  Antidepressants - SSRI Passed - 02/20/2023  6:50 AM      Passed - Completed PHQ-2 or PHQ-9 in the last 360 days      Passed - Valid encounter within last 6 months    Recent Outpatient Visits           1 week ago Aortic atherosclerosis [facility])   [place] [facility] [place], Salvadore Oxford, NP   7 months ago Encounter for general adult medical examination with abnormal findings   [place] [facility] [place], Salvadore Oxford, NP   1 year ago Prediabetes   [place] [facility] [place], Salvadore Oxford, NP   1 year ago Encounter for general adult medical examination with abnormal findings   [place] [facility] [place], Salvadore Oxford, NP   1 year ago Adjustment disorder with depressed mood   [place] [facility] Smitty Cords, DO       Future Appointments             In 5 months Baity, Salvadore Oxford, NP [place] [facility], [facility]

## 2023-02-22 LAB — COMPLETE METABOLIC PANEL WITH GFR
AG Ratio: 1.9 (calc) (ref 1.0–2.5)
ALT: 10 U/L (ref 6–29)
AST: 15 U/L (ref 10–35)
Albumin: 4 g/dL (ref 3.6–5.1)
Alkaline phosphatase (APISO): 54 U/L (ref 37–153)
BUN: 23 mg/dL (ref 7–25)
CO2: 34 mmol/L — ABNORMAL HIGH (ref 20–32)
Calcium: 9.5 mg/dL (ref 8.6–10.4)
Chloride: 102 mmol/L (ref 98–110)
Creat: 0.87 mg/dL (ref 0.60–1.00)
Globulin: 2.1 g/dL (ref 1.9–3.7)
Glucose, Bld: 84 mg/dL (ref 65–99)
Potassium: 4.1 mmol/L (ref 3.5–5.3)
Sodium: 143 mmol/L (ref 135–146)
Total Bilirubin: 0.4 mg/dL (ref 0.2–1.2)
Total Protein: 6.1 g/dL (ref 6.1–8.1)
eGFR: 70 mL/min/{1.73_m2} (ref >=60)

## 2023-02-22 LAB — CBC
HCT: 43.2 % (ref 35.0–45.0)
Hemoglobin: 13.9 g/dL (ref 11.7–15.5)
MCH: 29.1 pg (ref 27.0–33.0)
MCHC: 32.2 g/dL (ref 32.0–36.0)
MCV: 90.4 fL (ref 80.0–100.0)
MPV: 12.3 fL (ref 7.5–12.5)
Platelets: 191 10*3/uL (ref 140–400)
RBC: 4.78 10*6/uL (ref 3.80–5.10)
RDW: 14.4 % (ref 11.0–15.0)
WBC: 8.1 10*3/uL (ref 3.8–10.8)

## 2023-02-22 LAB — LIPID PANEL
Cholesterol: 134 mg/dL (ref <200)
HDL: 55 mg/dL (ref >=50)
LDL Cholesterol (Calc): 56 mg/dL
Non-HDL Cholesterol (Calc): 79 mg/dL (ref <130)
Total CHOL/HDL Ratio: 2.4 (calc) (ref <5.0)
Triglycerides: 150 mg/dL — ABNORMAL HIGH (ref <150)

## 2023-02-22 LAB — HEMOGLOBIN A1C
Hgb A1c MFr Bld: 6 %{Hb} — ABNORMAL HIGH (ref <5.7)
Mean Plasma Glucose: 126 mg/dL
eAG (mmol/L): 7 mmol/L

## 2023-03-18 ENCOUNTER — Other Ambulatory Visit: Payer: Self-pay | Admitting: Internal Medicine

## 2023-03-19 NOTE — Telephone Encounter (Signed)
Requested Prescriptions  Pending Prescriptions Disp Refills   omeprazole (PRILOSEC) 20 MG capsule [Pharmacy Med Name: Omeprazole 20 MG Oral Capsule Delayed Release] 90 capsule 1    Sig: Take 1 capsule by mouth once daily     Gastroenterology: Proton Pump Inhibitors Passed - 03/18/2023  8:09 PM      Passed - Valid encounter within last 12 months    Recent Outpatient Visits           1 month ago Aortic atherosclerosis [facility])   [place] [facility] [place], Salvadore Oxford, NP   8 months ago Encounter for general adult medical examination with abnormal findings   [place] [facility] [place], Salvadore Oxford, NP   1 year ago Prediabetes   [place] [facility] [place], Salvadore Oxford, NP   1 year ago Encounter for general adult medical examination with abnormal findings   [place] [facility] [place], Salvadore Oxford, NP   1 year ago Adjustment disorder with depressed mood   [place] [facility] [place] Smitty Cords, DO       Future Appointments             In 4 months Baity, Salvadore Oxford, NP [place] [facility], [facility]             nystatin (MYCOSTATIN/NYSTOP) powder [Pharmacy Med Name: Nystatin 100000 UNIT/GM External Powder] 60 g 0    Sig: APPLY TOPICALLY THREE TIMES DAILY     Off-Protocol Failed - 03/18/2023  8:09 PM      Failed - Medication not assigned to a protocol, review manually.      Passed - Valid encounter within last 12 months    Recent Outpatient Visits           1 month ago Aortic atherosclerosis [facility])   [place] [facility] [address] [place], Salvadore Oxford, NP   8 months ago Encounter for general adult medical examination with abnormal findings   [place] [facility] [place], Salvadore Oxford, NP   1 year ago Prediabetes   [place] [facility] [place], Salvadore Oxford, NP   1 year ago Encounter for general adult medical examination with abnormal findings    [place] [facility] [place], Salvadore Oxford, NP   1 year ago Adjustment disorder with depressed mood   [place] [facility] [place] Charon, Netta Neat, DO       Future Appointments             In 4 months Baity, Salvadore Oxford, NP [facility], [facility]             zolpidem (AMBIEN CR) 6.25 MG CR tablet [Pharmacy Med Name: Zolpidem Tartrate ER 6.25 MG Oral Tablet Extended Release] 30 tablet 0    Sig: TAKE 1 TABLET BY MOUTH AT BEDTIME AS NEEDED FOR SLEEP     Not Delegated - Psychiatry:  Anxiolytics/Hypnotics Failed - 03/18/2023  8:09 PM      Failed - This refill cannot be delegated      Failed - Urine Drug Screen completed in last 360 days      Passed - Valid encounter within last 6 months    Recent Outpatient Visits           1 month ago Aortic atherosclerosis [facility])    [facility], Salvadore Oxford, [place]  8 months ago Encounter for general adult medical examination with abnormal findings   [facility] [place], Salvadore Oxford, NP   1 year ago Prediabetes   [place] [facility] [place], Salvadore Oxford, NP   1 year ago Encounter for general adult medical examination with abnormal findings   [place] [facility] [place], Salvadore Oxford, NP   1 year ago Adjustment disorder with depressed mood   [place] [facility] Smitty Cords, DO       Future Appointments             In 4 months Baity, Salvadore Oxford, NP [facility], [facility]

## 2023-03-19 NOTE — Telephone Encounter (Signed)
Requested medication (s) are due for refill today: yes  Requested medication (s) are on the active medication list: yes  Last refill:  02/14/23 and 09/25/22  Future visit scheduled: yes  Notes to clinic:  Unable to refill per protocol, cannot delegate.      Requested Prescriptions  Pending Prescriptions Disp Refills   nystatin (MYCOSTATIN/NYSTOP) powder [Pharmacy Med Name: Nystatin 100000 UNIT/GM External Powder] 60 g 0    Sig: APPLY TOPICALLY THREE TIMES DAILY     Off-Protocol Failed - 03/18/2023  8:09 PM      Failed - Medication not assigned to a protocol, review manually.      Passed - Valid encounter within last 12 months    Recent Outpatient Visits           1 month ago Aortic atherosclerosis [facility])   [place] [facility] [place], Salvadore Oxford, NP   8 months ago Encounter for general adult medical examination with abnormal findings   [place] [facility] [place], Salvadore Oxford, NP   1 year ago Prediabetes   [place] [facility] [place], Salvadore Oxford, NP   1 year ago Encounter for general adult medical examination with abnormal findings   [place] [facility] [place], Salvadore Oxford, NP   1 year ago Adjustment disorder with depressed mood   [facility] [place] Charon, Netta Neat, DO       Future Appointments             In 4 months Baity, Salvadore Oxford, NP [place] [facility], [facility]             zolpidem (AMBIEN CR) 6.25 MG CR tablet [Pharmacy Med Name: Zolpidem Tartrate ER 6.25 MG Oral Tablet Extended Release] 30 tablet 0    Sig: TAKE 1 TABLET BY MOUTH AT BEDTIME AS NEEDED FOR SLEEP     Not Delegated - Psychiatry:  Anxiolytics/Hypnotics Failed - 03/18/2023  8:09 PM      Failed - This refill cannot be delegated      Failed - Urine Drug Screen completed in last 360 days      Passed - Valid encounter within last 6 months    Recent Outpatient Visits           1 month ago  Aortic atherosclerosis [facility])   [place] [facility] [place], Salvadore Oxford, NP   8 months ago Encounter for general adult medical examination with abnormal findings   [place] [facility] [place], Salvadore Oxford, NP   1 year ago Prediabetes   [place] [facility] [place], Salvadore Oxford, NP   1 year ago Encounter for general adult medical examination with abnormal findings   [place] [facility] [place], Salvadore Oxford, NP   1 year ago Adjustment disorder with depressed mood   [place] [facility] Smitty Cords, DO       Future Appointments             In 4 months Baity, Salvadore Oxford, NP  [facility], [facility]            Signed Prescriptions Disp Refills   omeprazole (PRILOSEC) 20 MG capsule 90 capsule 1    Sig: Take 1 capsule by mouth once daily     Gastroenterology: Proton Pump Inhibitors Passed - 03/18/2023  8:09 PM  Passed - Valid encounter within last 12 months    Recent Outpatient Visits           1 month ago Aortic atherosclerosis [facility])   [place] [facility] [place], Salvadore Oxford, NP   8 months ago Encounter for general adult medical examination with abnormal findings   [place] [facility] [place], Salvadore Oxford, NP   1 year ago Prediabetes   [place] [facility] [place], Salvadore Oxford, NP   1 year ago Encounter for general adult medical examination with abnormal findings   [place] [facility] [place], Salvadore Oxford, NP   1 year ago Adjustment disorder with depressed mood   [place]  [facility] Smitty Cords, DO       Future Appointments             In 4 months Baity, Salvadore Oxford, NP [place] [facility], [facility]

## 2023-04-29 ENCOUNTER — Telehealth: Payer: Self-pay

## 2023-04-29 ENCOUNTER — Telehealth: Payer: Self-pay | Admitting: Internal Medicine

## 2023-04-29 NOTE — Telephone Encounter (Signed)
Lawson Fiscal calling from [facility] is calling to report that patient [employer] on 04/27/23. Pt was advised that Wound vac would be ordered by [facility] does not supply. The  Wound Vac needs to be ordered No one has ordered supplies.   Lawson Fiscal needed order to start care tomorrow. And patient is in need wound vac and supplies.  CB- [phone] Verbal ok on vm

## 2023-04-29 NOTE — Transitions of Care (Post Inpatient/ED Visit) (Unsigned)
   04/29/2023  Name: Linda Farley MRN: [id number] DOB: 04-18-1951  Today's TOC FU Call Status: Today's TOC FU Call Status:: Unsuccessful Call (1st Attempt) Unsuccessful Call (1st Attempt) Date: 04/29/23  Attempted to reach the patient regarding the most recent Inpatient/ED visit.  Follow Up Plan: Additional outreach attempts will be made to reach the patient to complete the Transitions of Care (Post Inpatient/ED visit) call.   Signature  Kandis Fantasia, LPN [facility] Health Advisor [place] [facility] You Are. We Are. One [facility] Direct Dial [phone]

## 2023-04-30 NOTE — Telephone Encounter (Signed)
Spoke with Lawson Fiscal and advised per [place]. Lori verbalized understanding. No further action needed from [place] at this time.

## 2023-04-30 NOTE — Telephone Encounter (Signed)
Wound vacs are not ordered by primary care.  They need to reach out to the [facility] for these orders

## 2023-05-17 ENCOUNTER — Other Ambulatory Visit: Payer: Self-pay | Admitting: Internal Medicine

## 2023-05-17 DIAGNOSIS — F329 Major depressive disorder, single episode, unspecified: Secondary | ICD-10-CM

## 2023-05-20 NOTE — Telephone Encounter (Signed)
Requested Prescriptions  Pending Prescriptions Disp Refills   furosemide (LASIX) 20 MG tablet [Pharmacy Med Name: Furosemide 20 MG Oral Tablet] 90 tablet 0    Sig: Take 1 tablet by mouth once daily     Cardiovascular:  Diuretics - Loop Failed - 05/17/2023  6:50 AM      Failed - Mg Level in normal range and within 180 days    Magnesium  Date Value Ref Range Status  12/21/2019 1.9 1.7 - 2.4 mg/dL Final    Comment:    Performed at [facility], [address] [place], [place] [address]         Passed - K in normal range and within 180 days    Potassium  Date Value Ref Range Status  02/21/2023 4.1 3.5 - 5.3 mmol/L Final         Passed - Ca in normal range and within 180 days    Calcium  Date Value Ref Range Status  02/21/2023 9.5 8.6 - 10.4 mg/dL Final         Passed - Na in normal range and within 180 days    Sodium  Date Value Ref Range Status  02/21/2023 143 135 - 146 mmol/L Final         Passed - Cr in normal range and within 180 days    Creat  Date Value Ref Range Status  02/21/2023 0.87 0.60 - 1.00 mg/dL Final         Passed - Cl in normal range and within 180 days    Chloride  Date Value Ref Range Status  02/21/2023 102 98 - 110 mmol/L Final         Passed - Last BP in normal range    BP Readings from Last 1 Encounters:  02/14/23 132/78         Passed - Valid encounter within last 6 months    Recent Outpatient Visits           3 months ago Aortic atherosclerosis [facility])   [facility] [place], Salvadore Oxford, NP   10 months ago Encounter for general adult medical examination with abnormal findings   [facility] [place], Salvadore Oxford, NP   1 year ago Prediabetes   [place] [facility] [place], Salvadore Oxford, NP   2 years ago Encounter for general adult medical examination with abnormal findings   [place] [facility] [place], [place] W, NP   2 years ago Adjustment disorder with  depressed mood   [place] [place], Netta Neat, DO       Future Appointments             In 2 months Baity, Salvadore Oxford, NP [place] [facility]             citalopram (CELEXA) 20 MG tablet [Pharmacy Med Name: Citalopram Hydrobromide 20 MG Oral Tablet] 90 tablet 0    Sig: Take 1 tablet by mouth once daily     Psychiatry:  Antidepressants - SSRI Passed - 05/17/2023  6:50 AM      Passed - Completed PHQ-2 or PHQ-9 in the last 360 days      Passed - Valid encounter within last 6 months    Recent Outpatient Visits           3 months ago Aortic atherosclerosis [facility])   [facility] [place], Salvadore Oxford, NP   10 months ago Encounter for general adult medical examination with abnormal findings   [place] [facility] [place], Salvadore Oxford, NP   1 year ago Prediabetes   [facility] [place], Salvadore Oxford, NP   2 years ago Encounter for general adult medical examination with abnormal findings   [place] [facility] [place], Salvadore Oxford, NP   2 years ago Adjustment disorder with depressed mood   [place] Smitty Cords, DO       Future Appointments             In 2 months Baity, Salvadore Oxford, NP [place] [facility]

## 2023-07-04 ENCOUNTER — Other Ambulatory Visit: Payer: Self-pay | Admitting: Internal Medicine

## 2023-07-04 DIAGNOSIS — F5104 Psychophysiologic insomnia: Secondary | ICD-10-CM

## 2023-07-04 DIAGNOSIS — F4321 Adjustment disorder with depressed mood: Secondary | ICD-10-CM

## 2023-07-04 MED ORDER — TRAZODONE HCL 50 MG PO TABS: 50.0000 mg | ORAL_TABLET | Freq: Every evening | ORAL | 0 refills | Status: DC | PRN

## 2023-07-04 MED ORDER — ROPINIROLE HCL ER 2 MG PO TB24: 2.0000 mg | ORAL_TABLET | Freq: Every day | ORAL | 0 refills | Status: DC

## 2023-07-04 MED ORDER — ZOLPIDEM TARTRATE ER 6.25 MG PO TBCR: 6.2500 mg | EXTENDED_RELEASE_TABLET | Freq: Every evening | ORAL | 0 refills | Status: DC | PRN

## 2023-07-04 MED ORDER — BUPROPION HCL ER (XL) 150 MG PO TB24: 150.0000 mg | ORAL_TABLET | Freq: Every day | ORAL | 0 refills | Status: DC

## 2023-07-04 NOTE — Telephone Encounter (Signed)
Medication Refill -  Most Recent Primary Care Visit:  Provider: Lorre Munroe  Department: [facility]  Visit Type: OFFICE VISIT  Date: 02/14/2023  Medication: traZODone (DESYREL) 50 MG tablet zolpidem (AMBIEN CR) 6.25 MG CR tablet buPROPion (WELLBUTRIN XL) 150 MG 24 hr tablet rOPINIRole (REQUIP XL) 2 MG 24 hr tablet [[id number]]   Has the patient contacted their pharmacy? Yes  (Agent: If yes, when and what did the pharmacy advise?) Pharmacy told her reach out   Is this the correct pharmacy for this prescription? Yes  This is the patient's preferred pharmacy:  [facility] [address]), [place] - [address] [place]) [place] [address] Phone: [phone] Fax: [phone]   Has the prescription been filled recently? Yes  Is the patient out of the medication? No  Has the patient been seen for an appointment in the last year OR does the patient have an upcoming appointment? Yes  Can we respond through MyChart? No  Agent: Please be advised that Rx refills may take up to 3 business days. We ask that you follow-up with your pharmacy.

## 2023-07-04 NOTE — Telephone Encounter (Signed)
Requested medication (s) are due for refill today:yes  Requested medication (s) are on the active medication list: yes  Last refill:  03/19/23 30 tabs  Future visit scheduled: yes  Notes to clinic:  med not delegated to NT to RF   Requested Prescriptions  Pending Prescriptions Disp Refills   buPROPion (WELLBUTRIN XL) 150 MG 24 hr tablet 90 tablet 0    Sig: Take 1 tablet (150 mg total) by mouth daily.     Psychiatry: Antidepressants - bupropion Passed - 07/04/2023  2:05 PM      Passed - Cr in normal range and within 360 days    Creat  Date Value Ref Range Status  02/21/2023 0.87 0.60 - 1.00 mg/dL Final         Passed - AST in normal range and within 360 days    AST  Date Value Ref Range Status  02/21/2023 15 10 - 35 U/L Final         Passed - ALT in normal range and within 360 days    ALT  Date Value Ref Range Status  02/21/2023 10 6 - 29 U/L Final         Passed - Completed PHQ-2 or PHQ-9 in the last 360 days      Passed - Last BP in normal range    BP Readings from Last 1 Encounters:  02/14/23 132/78         Passed - Valid encounter within last 6 months    Recent Outpatient Visits           4 months ago Aortic atherosclerosis [facility])   [place] [facility] [place], Salvadore Oxford, NP   11 months ago Encounter for general adult medical examination with abnormal findings   [place] [place], Salvadore Oxford, NP   1 year ago Prediabetes   [place] [facility] [place], Salvadore Oxford, NP   2 years ago Encounter for general adult medical examination with abnormal findings   [place] [facility] [place], [place] W, NP   2 years ago Adjustment disorder with depressed mood   [place] [facility] [place], Netta Neat, DO       Future Appointments             In 2 weeks Sampson Si, Salvadore Oxford, NP [place] [facility]             traZODone (DESYREL) 50 MG tablet  90 tablet 0     Psychiatry: Antidepressants - Serotonin Modulator Passed - 07/04/2023  2:05 PM      Passed - Completed PHQ-2 or PHQ-9 in the last 360 days      Passed - Valid encounter within last 6 months    Recent Outpatient Visits           4 months ago Aortic atherosclerosis [facility])   [facility] [place], Salvadore Oxford, NP   11 months ago Encounter for general adult medical examination with abnormal findings   [place] [facility] [place], Salvadore Oxford, NP   1 year ago Prediabetes   [place] [place], Salvadore Oxford, NP   2 years ago Encounter for general adult medical examination with abnormal findings   [place] [facility] [place], [place], NP   2 years ago Adjustment disorder with depressed mood   [facility]  [facility] Smitty Cords, DO       Future Appointments             In 2 weeks Sampson Si, Salvadore Oxford, NP [place] [facility]             zolpidem (AMBIEN CR) 6.25 MG CR tablet 30 tablet 0    Sig: Take 1 tablet (6.25 mg total) by mouth at bedtime as needed. for sleep     Not Delegated - Psychiatry:  Anxiolytics/Hypnotics Failed - 07/04/2023  2:05 PM      Failed - This refill cannot be delegated      Failed - Urine Drug Screen completed in last 360 days      Passed - Valid encounter within last 6 months    Recent Outpatient Visits           4 months ago Aortic atherosclerosis [facility])   [place] [facility] [place], Salvadore Oxford, NP   11 months ago Encounter for general adult medical examination with abnormal findings   [place] [place], Salvadore Oxford, NP   1 year ago Prediabetes   [place] [facility] [place], Salvadore Oxford, NP   2 years ago Encounter for general adult medical examination with abnormal findings   [place] [facility] [place], Salvadore Oxford, NP   2 years ago  Adjustment disorder with depressed mood   [facility] Smitty Cords, DO       Future Appointments             In 2 weeks Sampson Si, Salvadore Oxford, NP [place] [facility]             rOPINIRole (REQUIP XL) 2 MG 24 hr tablet 90 tablet 0    Sig: Take 1 tablet (2 mg total) by mouth at bedtime.     Neurology:  Parkinsonian Agents Passed - 07/04/2023  2:05 PM      Passed - Last BP in normal range    BP Readings from Last 1 Encounters:  02/14/23 132/78         Passed - Last Heart Rate in normal range    Pulse Readings from Last 1 Encounters:  02/14/23 72         Passed - Valid encounter within last 12 months    Recent Outpatient Visits           4 months ago Aortic atherosclerosis [facility])   [place] [facility] [place], Salvadore Oxford, NP   11 months ago Encounter for general adult medical examination with abnormal findings   [place] [facility] [place], Salvadore Oxford, NP   1 year ago Prediabetes   [place] [facility] [place], Salvadore Oxford, NP   2 years ago Encounter for general adult medical examination with abnormal findings   [place] [facility] [place], Salvadore Oxford, NP   2 years ago Adjustment disorder with depressed mood   [place] [facility] Smitty Cords, DO       Future Appointments             In 2 weeks Sampson Si, Salvadore Oxford, NP [place] [facility], [facility]

## 2023-07-22 ENCOUNTER — Encounter: Payer: PPO | Admitting: Internal Medicine

## 2023-08-04 ENCOUNTER — Other Ambulatory Visit: Payer: Self-pay | Admitting: Internal Medicine

## 2023-08-06 NOTE — Telephone Encounter (Signed)
 Requested medications are due for refill today.  yes  Requested medications are on the active medications list.  yes  Last refill. 07/04/2023 #30 0 rf  Future visit scheduled.   yes  Notes to clinic.  Refill not delegated.    Requested Prescriptions  Pending Prescriptions Disp Refills   zolpidem (AMBIEN CR) 6.25 MG CR tablet [Pharmacy Med Name: Zolpidem Tartrate ER 6.25 MG Oral Tablet Extended Release] 30 tablet 0    Sig: TAKE 1 TABLET BY MOUTH AT BEDTIME AS NEEDED FOR SLEEP     Not Delegated - Psychiatry:  Anxiolytics/Hypnotics Failed - 08/06/2023 10:23 AM      Failed - This refill cannot be delegated      Failed - Urine Drug Screen completed in last 360 days      Passed - Valid encounter within last 6 months    Recent Outpatient Visits           5 months ago Aortic atherosclerosis [facility])   [place] [facility] [place], Salvadore Oxford, NP   1 year ago Encounter for general adult medical examination with abnormal findings   [place] [facility] [place], Salvadore Oxford, NP   1 year ago Prediabetes   [place] [facility] [place], Salvadore Oxford, NP   2 years ago Encounter for general adult medical examination with abnormal findings   [place] [facility] [place], [place] W, NP   2 years ago Adjustment disorder with depressed mood   [place] [facility] Smitty Cords, DO       Future Appointments             In 2 weeks Sampson Si, Salvadore Oxford, NP [place] [facility], [facility]

## 2023-08-20 ENCOUNTER — Encounter: Payer: Self-pay | Admitting: Internal Medicine

## 2023-08-20 ENCOUNTER — Ambulatory Visit (INDEPENDENT_AMBULATORY_CARE_PROVIDER_SITE_OTHER): Payer: Self-pay | Admitting: Internal Medicine

## 2023-08-20 VITALS — BP 132/74 | Ht 65.0 in | Wt 199.8 lb

## 2023-08-20 DIAGNOSIS — Z0001 Encounter for general adult medical examination with abnormal findings: Secondary | ICD-10-CM

## 2023-08-20 DIAGNOSIS — F329 Major depressive disorder, single episode, unspecified: Secondary | ICD-10-CM | POA: Diagnosis not present

## 2023-08-20 DIAGNOSIS — R7303 Prediabetes: Secondary | ICD-10-CM

## 2023-08-20 DIAGNOSIS — E66811 Obesity, class 1: Secondary | ICD-10-CM

## 2023-08-20 DIAGNOSIS — E782 Mixed hyperlipidemia: Secondary | ICD-10-CM | POA: Diagnosis not present

## 2023-08-20 DIAGNOSIS — Z6833 Body mass index (BMI) 33.0-33.9, adult: Secondary | ICD-10-CM | POA: Diagnosis not present

## 2023-08-20 DIAGNOSIS — E6609 Other obesity due to excess calories: Secondary | ICD-10-CM

## 2023-08-20 MED ORDER — OMEPRAZOLE 20 MG PO CPDR: 20.0000 mg | DELAYED_RELEASE_CAPSULE | Freq: Every day | ORAL | 1 refills | Status: DC

## 2023-08-20 MED ORDER — CITALOPRAM HYDROBROMIDE 20 MG PO TABS: 20.0000 mg | ORAL_TABLET | Freq: Every day | ORAL | 1 refills | Status: DC

## 2023-08-20 MED ORDER — FUROSEMIDE 20 MG PO TABS: 20.0000 mg | ORAL_TABLET | Freq: Every day | ORAL | 1 refills | Status: DC

## 2023-08-20 MED ORDER — LISINOPRIL 20 MG PO TABS: 20.0000 mg | ORAL_TABLET | Freq: Every day | ORAL | 1 refills | Status: AC

## 2023-08-20 NOTE — Assessment & Plan Note (Signed)
 Encouraged diet and exercise for weight loss ?

## 2023-08-20 NOTE — Progress Notes (Signed)
 Subjective:    Patient ID: Linda Farley, female    DOB: [birth year], 73 y.o.   MRN: [id number]  HPI  Patient presents to clinic today for annual exam.  Flu: 02/2023 Tetanus: 02/2023 COVID: Moderna x 2 Pneumovax: 12/2019 Prevnar 20: 07/2022 Shingrix: x 1, [employer] Pap smear: Hysterectomy Mammogram: never Bone density: 03/2020 Colon screening: 09/2019 Vision screening: as needed Dentist: biannually  Diet: She does eat meat. She consumes fruits and veggies. She does eat some fried foods. She drinks mostly water, [place]. dew Exercise: Walking  Review of Systems     Past Medical History:  Diagnosis Date   Allergy    Anxiety    Arthritis    Depression    Hypertension     Current Outpatient Medications  Medication Sig Dispense Refill   albuterol (VENTOLIN HFA) 108 (90 Base) MCG/ACT inhaler INHALE 2 PUFFS BY MOUTH EVERY 6 HOURS AS NEEDED FOR WHEEZING OR SHORTNESS OF BREATH 18 g 0   aspirin EC 81 MG tablet Take 1 tablet (81 mg total) by mouth daily. Swallow whole.     buPROPion (WELLBUTRIN XL) 150 MG 24 hr tablet Take 1 tablet (150 mg total) by mouth daily. 90 tablet 0   calcium carbonate (TUMS - DOSED IN MG ELEMENTAL CALCIUM) 500 MG chewable tablet Chew 1 tablet by mouth daily.     Cholecalciferol (D3 SUPER STRENGTH) 50 MCG (2000 UT) CAPS Take by mouth.     citalopram (CELEXA) 20 MG tablet Take 1 tablet by mouth once daily 90 tablet 0   furosemide (LASIX) 20 MG tablet Take 1 tablet by mouth once daily 90 tablet 0   gabapentin (NEURONTIN) 300 MG capsule Take 1 capsule (300 mg total) by mouth as directed. One capsule in the morning and two capsules every day at bedtime 270 capsule 1   HYDROcodone-acetaminophen (NORCO) 7.5-325 MG tablet      lisinopril (ZESTRIL) 20 MG tablet Take 1 tablet by mouth once daily 90 tablet 1   nystatin (MYCOSTATIN/NYSTOP) powder APPLY TOPICALLY THREE TIMES DAILY 60 g 0   omeprazole (PRILOSEC) 20 MG capsule Take 1 capsule by mouth once daily 90 capsule 1    rOPINIRole (REQUIP XL) 2 MG 24 hr tablet Take 1 tablet (2 mg total) by mouth at bedtime. 90 tablet 0   traZODone (DESYREL) 50 MG tablet Take 1 tablet (50 mg total) by mouth at bedtime as needed for sleep. 90 tablet 0   zolpidem (AMBIEN CR) 6.25 MG CR tablet TAKE 1 TABLET BY MOUTH AT BEDTIME AS NEEDED FOR SLEEP 30 tablet 0   No current facility-administered medications for this visit.    Allergies  Allergen Reactions   Chlorhexidine    Penicillins Rash    Family History  Problem Relation Age of Onset   Prostate cancer Father    Bone cancer Brother    Heart disease Brother     Social History   Socioeconomic History   Marital status: Married    Spouse name: Not on file   Number of children: Not on file   Years of education: Not on file   Highest education level: Not on file  Occupational History   Not on file  Tobacco Use   Smoking status: Never   Smokeless tobacco: Never  Vaping Use   Vaping status: Never Used  Substance and Sexual Activity   Alcohol use: Yes    Alcohol/week: 1.0 standard drink of alcohol    Types: 1 Glasses of wine per week  Comment: 3 wine coolers weekly   Drug use: Never   Sexual activity: Not on file  Other Topics Concern   Not on file  Social History Narrative   Not on file   Social Drivers of Health   Financial Resource Strain: Not on file  Food Insecurity: No Food Insecurity (05/16/2023)   [facility]   Hunger Vital Sign    Worried About Running Out of Food in the Last Year: Never true    Ran Out of Food in the Last Year: Never true  Transportation Needs: No Transportation Needs (03/25/2023)   [facility] - [facility]    Lack of Transportation (Medical): No    Lack of Transportation (Non-Medical): No  Physical Activity: Not on file  Stress: Not on file  Social Connections: Not on file  Intimate Partner Violence: Not on file     Constitutional: Denies fever, malaise, fatigue,  headache or abrupt weight changes.  HEENT: Denies eye pain, eye redness, ear pain, ringing in the ears, wax buildup, runny nose, nasal congestion, bloody nose, or sore throat. Respiratory: Denies difficulty breathing, shortness of breath, cough or sputum production.   Cardiovascular: Pt reports swelling in legs. Denies chest pain, chest tightness, palpitations or swelling in the hands.  Gastrointestinal:  Denies abdominal pain, bloating, constipation, diarrhea or blood in the stool.  GU: Denies urgency, frequency, pain with urination, burning sensation, blood in urine, odor or discharge. Musculoskeletal: Patient reports intermittent joint pain.  Denies decrease in range of motion, difficulty with gait, muscle pain or joint swelling.  Skin: Denies redness, rashes, lesions or ulcercations.  Neurological: Patient reports insomnia and restless legs.  Denies dizziness, difficulty with memory, difficulty with speech or problems with balance and coordination.  Psych: Patient has a history of depression.  Denies anxiety, SI/HI.  No other specific complaints in a complete review of systems (except as listed in HPI above).  Objective:   Physical Exam  BP 132/74 (BP Location: Left Arm, Patient Position: Sitting, Cuff Size: Normal)   Ht 5\' 5"  (1.651 m)   Wt 199 lb 12.8 oz (90.6 kg)   BMI 33.25 kg/m    Wt Readings from Last 3 Encounters:  02/14/23 206 lb (93.4 kg)  07/19/22 206 lb (93.4 kg)  01/09/22 208 lb (94.3 kg)    General: Appears her stated age, obese, in NAD. Skin: Warm, dry and intact. No rashes noted. HEENT: Head: normal shape and size; Eyes: sclera white, no icterus, conjunctiva pink, PERRLA and EOMs intact;  Neck:  Neck supple, trachea midline. No masses, lumps or thyromegaly present.  Cardiovascular: Normal rate and rhythm. S1,S2 noted.  No murmur, rubs or gallops noted. No JVD.  Lymphedema of BLE. No carotid bruits noted. Pulmonary/Chest: Normal effort and positive vesicular breath  sounds. No respiratory distress. No wheezes, rales or ronchi noted.  Abdomen: Normal bowel sounds.  No mass or tenderness noted.  Active bowel sounds.  Liver, spleen and kidneys nonpalpable. Musculoskeletal: Strength 5/5 BUE/BLE.  She has difficulty getting from a sitting to a standing position.  Gait slow and steady with use of cane. Neurological: Alert and oriented. Cranial nerves II-XII grossly intact. Coordination normal.  Psychiatric: Mood and affect normal. Behavior is normal. Judgment and thought content normal.     BMET    Component Value Date/Time   NA 143 02/21/2023 1311   K 4.1 02/21/2023 1311   CL 102 02/21/2023 1311   CO2 34 (H) 02/21/2023  1311   GLUCOSE 84 02/21/2023 1311   BUN 23 02/21/2023 1311   CREATININE 0.87 02/21/2023 1311   CALCIUM 9.5 02/21/2023 1311   GFRNONAA >60 12/23/2019 0508   GFRAA >60 12/23/2019 0508    Lipid Panel     Component Value Date/Time   CHOL 134 02/21/2023 1311   TRIG 150 (H) 02/21/2023 1311   HDL 55 02/21/2023 1311   CHOLHDL 2.4 02/21/2023 1311   VLDL 16 12/19/2019 0552   LDLCALC 56 02/21/2023 1311    CBC    Component Value Date/Time   WBC 8.1 02/21/2023 1311   RBC 4.78 02/21/2023 1311   HGB 13.9 02/21/2023 1311   HCT 43.2 02/21/2023 1311   PLT 191 02/21/2023 1311   MCV 90.4 02/21/2023 1311   MCH 29.1 02/21/2023 1311   MCHC 32.2 02/21/2023 1311   RDW 14.4 02/21/2023 1311   LYMPHSABS 2,651 09/03/2019 0928   EOSABS 74 09/03/2019 0928   BASOSABS 74 09/03/2019 0928    Hgb A1C Lab Results  Component Value Date   HGBA1C 6.0 (H) 02/21/2023            Assessment & Plan:   Preventative Health Maintenance:  Flu shot UTD Tetanus UTD Encouraged her to get her COVID booster Pneumovax and Prevnar UTD Discussed Shingrix vaccine, she will check coverage with her insurance company and schedule visit if she would like to have this done She no longer needs Pap smears She declines mammograms Bone density UTD Colon  screening UTD Encouraged her to consume a balanced diet and exercise regimen Advised her to see an eye doctor and dentist annually We will check CBC, c-Met, lipid, A1c  today   RTC in 6 months, follow-up chronic conditions Nicki Reaper, NP

## 2023-08-20 NOTE — Patient Instructions (Signed)
 Health Maintenance for Postmenopausal Women Menopause is a normal process in which your ability to get pregnant comes to an end. This process happens slowly over many months or years, usually between the ages of 24 and 62. Menopause is complete when you have missed your menstrual period for 12 months. It is important to talk with your health care provider about some of the most common conditions that affect women after menopause (postmenopausal women). These include heart disease, cancer, and bone loss (osteoporosis). Adopting a healthy lifestyle and getting preventive care can help to promote your health and wellness. The actions you take can also lower your chances of developing some of these common conditions. What are the signs and symptoms of menopause? During menopause, you may have the following symptoms: Hot flashes. These can be moderate or severe. Night sweats. Decrease in sex drive. Mood swings. Headaches. Tiredness (fatigue). Irritability. Memory problems. Problems falling asleep or staying asleep. Talk with your health care provider about treatment options for your symptoms. Do I need hormone replacement therapy? Hormone replacement therapy is effective in treating symptoms that are caused by menopause, such as hot flashes and night sweats. Hormone replacement carries certain risks, especially as you become older. If you are thinking about using estrogen or estrogen with progestin, discuss the benefits and risks with your health care provider. How can I reduce my risk for heart disease and stroke? The risk of heart disease, heart attack, and stroke increases as you age. One of the causes may be a change in the body's hormones during menopause. This can affect how your body uses dietary fats, triglycerides, and cholesterol. Heart attack and stroke are medical emergencies. There are many things that you can do to help prevent heart disease and stroke. Watch your blood pressure High  blood pressure causes heart disease and increases the risk of stroke. This is more likely to develop in people who have high blood pressure readings or are overweight. Have your blood pressure checked: Every 3-5 years if you are 50-75 years of age. Every year if you are 77 years old or older. Eat a healthy diet  Eat a diet that includes plenty of vegetables, fruits, low-fat dairy products, and lean protein. Do not eat a lot of foods that are high in solid fats, added sugars, or sodium. Get regular exercise Get regular exercise. This is one of the most important things you can do for your health. Most adults should: Try to exercise for at least 150 minutes each week. The exercise should increase your heart rate and make you sweat (moderate-intensity exercise). Try to do strengthening exercises at least twice each week. Do these in addition to the moderate-intensity exercise. Spend less time sitting. Even light physical activity can be beneficial. Other tips Work with your health care provider to achieve or maintain a healthy weight. Do not use any products that contain nicotine or tobacco. These products include cigarettes, chewing tobacco, and vaping devices, such as e-cigarettes. If you need help quitting, ask your health care provider. Know your numbers. Ask your health care provider to check your cholesterol and your blood sugar (glucose). Continue to have your blood tested as directed by your health care provider. Do I need screening for cancer? Depending on your health history and family history, you may need to have cancer screenings at different stages of your life. This may include screening for: Breast cancer. Cervical cancer. Lung cancer. Colorectal cancer. What is my risk for osteoporosis? After menopause, you may be  at increased risk for osteoporosis. Osteoporosis is a condition in which bone destruction happens more quickly than new bone creation. To help prevent osteoporosis or  the bone fractures that can happen because of osteoporosis, you may take the following actions: If you are 61-3 years old, get at least 1,000 mg of calcium and at least 600 international units (IU) of vitamin D per day. If you are older than age 61 but younger than age 75, get at least 1,200 mg of calcium and at least 600 international units (IU) of vitamin D per day. If you are older than age 62, get at least 1,200 mg of calcium and at least 800 international units (IU) of vitamin D per day. Smoking and drinking excessive alcohol increase the risk of osteoporosis. Eat foods that are rich in calcium and vitamin D, and do weight-bearing exercises several times each week as directed by your health care provider. How does menopause affect my mental health? Depression may occur at any age, but it is more common as you become older. Common symptoms of depression include: Feeling depressed. Changes in sleep patterns. Changes in appetite or eating patterns. Feeling an overall lack of motivation or enjoyment of activities that you previously enjoyed. Frequent crying spells. Talk with your health care provider if you think that you are experiencing any of these symptoms. General instructions See your health care provider for regular wellness exams and vaccines. This may include: Scheduling regular health, dental, and eye exams. Getting and maintaining your vaccines. These include: Influenza vaccine. Get this vaccine each year before the flu season begins. Pneumonia vaccine. Shingles vaccine. Tetanus, diphtheria, and pertussis (Tdap) booster vaccine. Your health care provider may also recommend other immunizations. Tell your health care provider if you have ever been abused or do not feel safe at home. Summary Menopause is a normal process in which your ability to get pregnant comes to an end. This condition causes hot flashes, night sweats, decreased interest in sex, mood swings, headaches, or lack  of sleep. Treatment for this condition may include hormone replacement therapy. Take actions to keep yourself healthy, including exercising regularly, eating a healthy diet, watching your weight, and checking your blood pressure and blood sugar levels. Get screened for cancer and depression. Make sure that you are up to date with all your vaccines. This information is not intended to replace advice given to you by your health care provider. Make sure you discuss any questions you have with your health care provider. Document Revised: 10/17/2020 Document Reviewed: 10/17/2020 Elsevier Patient Education  2024 [employer].

## 2023-08-21 ENCOUNTER — Encounter: Payer: Self-pay | Admitting: Internal Medicine

## 2023-08-21 LAB — LIPID PANEL
Cholesterol: 180 mg/dL (ref <200)
HDL: 62 mg/dL (ref >=50)
LDL Cholesterol (Calc): 94 mg/dL
Non-HDL Cholesterol (Calc): 118 mg/dL (ref <130)
Total CHOL/HDL Ratio: 2.9 (calc) (ref <5.0)
Triglycerides: 143 mg/dL (ref <150)

## 2023-08-21 LAB — CBC
HCT: 44.5 % (ref 35.0–45.0)
Hemoglobin: 14.4 g/dL (ref 11.7–15.5)
MCH: 28.8 pg (ref 27.0–33.0)
MCHC: 32.4 g/dL (ref 32.0–36.0)
MCV: 89 fL (ref 80.0–100.0)
MPV: 12.2 fL (ref 7.5–12.5)
Platelets: 240 10*3/uL (ref 140–400)
RBC: 5 10*6/uL (ref 3.80–5.10)
RDW: 13 % (ref 11.0–15.0)
WBC: 8.3 10*3/uL (ref 3.8–10.8)

## 2023-08-21 LAB — COMPLETE METABOLIC PANEL WITH GFR
AG Ratio: 1.9 (calc) (ref 1.0–2.5)
ALT: 11 U/L (ref 6–29)
AST: 13 U/L (ref 10–35)
Albumin: 4.6 g/dL (ref 3.6–5.1)
Alkaline phosphatase (APISO): 63 U/L (ref 37–153)
BUN/Creatinine Ratio: 34 (calc) — ABNORMAL HIGH (ref 6–22)
BUN: 28 mg/dL — ABNORMAL HIGH (ref 7–25)
CO2: 33 mmol/L — ABNORMAL HIGH (ref 20–32)
Calcium: 9.8 mg/dL (ref 8.6–10.4)
Chloride: 99 mmol/L (ref 98–110)
Creat: 0.83 mg/dL (ref 0.60–1.00)
Globulin: 2.4 g/dL (ref 1.9–3.7)
Glucose, Bld: 93 mg/dL (ref 65–99)
Potassium: 4.4 mmol/L (ref 3.5–5.3)
Sodium: 139 mmol/L (ref 135–146)
Total Bilirubin: 0.5 mg/dL (ref 0.2–1.2)
Total Protein: 7 g/dL (ref 6.1–8.1)
eGFR: 74 mL/min/{1.73_m2} (ref >=60)

## 2023-08-21 LAB — HEMOGLOBIN A1C
Hgb A1c MFr Bld: 5.7 %{Hb} — ABNORMAL HIGH (ref <5.7)
Mean Plasma Glucose: 117 mg/dL
eAG (mmol/L): 6.5 mmol/L

## 2023-09-10 ENCOUNTER — Other Ambulatory Visit: Payer: Self-pay | Admitting: Internal Medicine

## 2023-09-10 DIAGNOSIS — J45998 Other asthma: Secondary | ICD-10-CM

## 2023-09-12 ENCOUNTER — Other Ambulatory Visit: Payer: Self-pay | Admitting: Internal Medicine

## 2023-09-12 NOTE — Telephone Encounter (Signed)
 Requested medications are due for refill today.  yes  Requested medications are on the active medications list.  yes  Last refill. 08/07/2023 #30 0 rf  Future visit scheduled.   yes  Notes to clinic.  Refill not delegated.    Requested Prescriptions  Pending Prescriptions Disp Refills   zolpidem (AMBIEN CR) 6.25 MG CR tablet [Pharmacy Med Name: Zolpidem Tartrate ER 6.25 MG Oral Tablet Extended Release] 30 tablet 0    Sig: TAKE 1 TABLET BY MOUTH AT BEDTIME AS NEEDED FOR SLEEP     Not Delegated - Psychiatry:  Anxiolytics/Hypnotics Failed - 09/12/2023 11:45 AM      Failed - This refill cannot be delegated      Failed - Urine Drug Screen completed in last 360 days      Passed - Valid encounter within last 6 months    Recent Outpatient Visits           3 weeks ago Encounter for general adult medical examination with abnormal findings   [place] [facility] [place], Salvadore [place], NP              Signed Prescriptions Disp Refills   albuterol (VENTOLIN HFA) 108 (90 Base) MCG/ACT inhaler 18 g 2    Sig: INHALE 2 PUFFS BY MOUTH EVERY 6 HOURS AS NEEDED FOR WHEEZING FOR SHORTNESS OF BREATH     Pulmonology:  Beta Agonists 2 Passed - 09/12/2023 11:45 AM      Passed - Last BP in normal range    BP Readings from Last 1 Encounters:  08/20/23 132/74         Passed - Last Heart Rate in normal range    Pulse Readings from Last 1 Encounters:  02/14/23 72         Passed - Valid encounter within last 12 months    Recent Outpatient Visits           3 weeks ago Encounter for general adult medical examination with abnormal findings    [facility] [place], Salvadore Oxford, [place]

## 2023-09-12 NOTE — Telephone Encounter (Signed)
 Requested Prescriptions  Pending Prescriptions Disp Refills   zolpidem (AMBIEN CR) 6.25 MG CR tablet [Pharmacy Med Name: Zolpidem Tartrate ER 6.25 MG Oral Tablet Extended Release] 30 tablet 0    Sig: TAKE 1 TABLET BY MOUTH AT BEDTIME AS NEEDED FOR SLEEP     Not Delegated - Psychiatry:  Anxiolytics/Hypnotics Failed - 09/12/2023 11:45 AM      Failed - This refill cannot be delegated      Failed - Urine Drug Screen completed in last 360 days      Passed - Valid encounter within last 6 months    Recent Outpatient Visits           3 weeks ago Encounter for general adult medical examination with abnormal findings   [place] [facility] [place], [place] W, NP               albuterol (VENTOLIN HFA) 108 (90 Base) MCG/ACT inhaler [Pharmacy Med Name: Albuterol Sulfate HFA 108 (90 Base) MCG/ACT Inhalation Aerosol Solution] 18 g 2    Sig: INHALE 2 PUFFS BY MOUTH EVERY 6 HOURS AS NEEDED FOR WHEEZING FOR SHORTNESS OF BREATH     Pulmonology:  Beta Agonists 2 Passed - 09/12/2023 11:45 AM      Passed - Last BP in normal range    BP Readings from Last 1 Encounters:  08/20/23 132/74         Passed - Last Heart Rate in normal range    Pulse Readings from Last 1 Encounters:  02/14/23 72         Passed - Valid encounter within last 12 months    Recent Outpatient Visits           3 weeks ago Encounter for general adult medical examination with abnormal findings   [place] [facility] [place], Salvadore [place], [place]

## 2023-09-13 NOTE — Telephone Encounter (Signed)
 Requested medications are due for refill today.  no  Requested medications are on the active medications list.  yes  Last refill. 09/12/2023 #30 0 rf  Future visit scheduled.   yes  Notes to clinic.  Refill/refusal not delegated.   Requested Prescriptions  Pending Prescriptions Disp Refills   zolpidem (AMBIEN CR) 6.25 MG CR tablet [Pharmacy Med Name: Zolpidem Tartrate ER 6.25 MG Oral Tablet Extended Release] 30 tablet 0    Sig: TAKE 1 TABLET BY MOUTH AT BEDTIME AS NEEDED FOR SLEEP     Not Delegated - Psychiatry:  Anxiolytics/Hypnotics Failed - 09/13/2023  1:36 PM      Failed - This refill cannot be delegated      Failed - Urine Drug Screen completed in last 360 days      Passed - Valid encounter within last 6 months    Recent Outpatient Visits           3 weeks ago Encounter for general adult medical examination with abnormal findings   [place] [facility] [place], Salvadore [place], NP

## 2023-10-09 ENCOUNTER — Other Ambulatory Visit: Payer: Self-pay | Admitting: Internal Medicine

## 2023-10-09 DIAGNOSIS — F4321 Adjustment disorder with depressed mood: Secondary | ICD-10-CM

## 2023-10-09 DIAGNOSIS — F5104 Psychophysiologic insomnia: Secondary | ICD-10-CM

## 2023-10-12 NOTE — Telephone Encounter (Signed)
 Requested medication (s) are due for refill today: Yes  Requested medication (s) are on the active medication list: Yes  Last refill:  09/12/23, #30, 0 refill  Future visit scheduled: No  Notes to clinic:  Unable to refill per protocol, cannot delegate.      Requested Prescriptions  Pending Prescriptions Disp Refills   zolpidem  (AMBIEN  CR) 6.25 MG CR tablet [Pharmacy Med Name: Zolpidem  Tartrate ER 6.25 MG Oral Tablet Extended Release] 30 tablet 0    Sig: TAKE 1 TABLET BY MOUTH AT BEDTIME AS NEEDED FOR SLEEP     Not Delegated - Psychiatry:  Anxiolytics/Hypnotics Failed - 10/12/2023  3:09 AM      Failed - This refill cannot be delegated      Failed - Urine Drug Screen completed in last 360 days      Passed - Valid encounter within last 6 months    Recent Outpatient Visits           1 month ago Encounter for general adult medical examination with abnormal findings   [place] [facility] [place], Rankin Buzzard, NP              Signed Prescriptions Disp Refills   traZODone  (DESYREL ) 50 MG tablet 90 tablet 0    Sig: TAKE 1 TABLET BY MOUTH AT BEDTIME AS NEEDED FOR SLEEP     Psychiatry: Antidepressants - Serotonin Modulator Passed - 10/12/2023  3:09 AM      Passed - Completed PHQ-2 or PHQ-9 in the last 360 days      Passed - Valid encounter within last 6 months    Recent Outpatient Visits           1 month ago Encounter for general adult medical examination with abnormal findings   [place] [facility] [place], [place] W, NP               rOPINIRole  (REQUIP  XL) 2 MG 24 hr tablet 90 tablet 0    Sig: TAKE 1 TABLET BY MOUTH AT BEDTIME     Neurology:  Parkinsonian Agents Passed - 10/12/2023  3:09 AM      Passed - Last BP in normal range    BP Readings from Last 1 Encounters:  08/20/23 132/74         Passed - Last Heart Rate in normal range    Pulse Readings from Last 1 Encounters:  02/14/23 72         Passed - Valid encounter within last  12 months    Recent Outpatient Visits           1 month ago Encounter for general adult medical examination with abnormal findings   [place] [facility] [place], Rankin Buzzard, NP

## 2023-10-12 NOTE — Telephone Encounter (Signed)
 Requested Prescriptions  Pending Prescriptions Disp Refills   zolpidem  (AMBIEN  CR) 6.25 MG CR tablet [Pharmacy Med Name: Zolpidem  Tartrate ER 6.25 MG Oral Tablet Extended Release] 30 tablet 0    Sig: TAKE 1 TABLET BY MOUTH AT BEDTIME AS NEEDED FOR SLEEP     Not Delegated - Psychiatry:  Anxiolytics/Hypnotics Failed - 10/12/2023  3:07 AM      Failed - This refill cannot be delegated      Failed - Urine Drug Screen completed in last 360 days      Passed - Valid encounter within last 6 months    Recent Outpatient Visits           1 month ago Encounter for general adult medical examination with abnormal findings   [place] [facility] [place], [place] W, NP               traZODone  (DESYREL ) 50 MG tablet [Pharmacy Med Name: traZODone  HCl 50 MG Oral Tablet] 90 tablet 0    Sig: TAKE 1 TABLET BY MOUTH AT BEDTIME AS NEEDED FOR SLEEP     Psychiatry: Antidepressants - Serotonin Modulator Passed - 10/12/2023  3:07 AM      Passed - Completed PHQ-2 or PHQ-9 in the last 360 days      Passed - Valid encounter within last 6 months    Recent Outpatient Visits           1 month ago Encounter for general adult medical examination with abnormal findings   [place] [facility] [place], [place] W, NP               rOPINIRole  (REQUIP  XL) 2 MG 24 hr tablet [Pharmacy Med Name: rOPINIRole  HCl ER 2 MG Oral Tablet Extended Release 24 Hour] 90 tablet 0    Sig: TAKE 1 TABLET BY MOUTH AT BEDTIME     Neurology:  Parkinsonian Agents Passed - 10/12/2023  3:07 AM      Passed - Last BP in normal range    BP Readings from Last 1 Encounters:  08/20/23 132/74         Passed - Last Heart Rate in normal range    Pulse Readings from Last 1 Encounters:  02/14/23 72         Passed - Valid encounter within last 12 months    Recent Outpatient Visits           1 month ago Encounter for general adult medical examination with abnormal findings    [facility] [place], Rankin Buzz[place], [place]

## 2023-10-14 ENCOUNTER — Other Ambulatory Visit: Payer: Self-pay | Admitting: Internal Medicine

## 2023-10-16 NOTE — Telephone Encounter (Signed)
 Requested Prescriptions  Pending Prescriptions Disp Refills   buPROPion  (WELLBUTRIN  XL) 150 MG 24 hr tablet [Pharmacy Med Name: buPROPion  HCl ER (XL) 150 MG Oral Tablet Extended Release 24 Hour] 90 tablet 0    Sig: Take 1 tablet by mouth once daily     Psychiatry: Antidepressants - bupropion  Passed - 10/16/2023  8:24 AM      Passed - Cr in normal range and within 360 days    Creat  Date Value Ref Range Status  08/20/2023 0.83 0.60 - 1.00 mg/dL Final         Passed - AST in normal range and within 360 days    AST  Date Value Ref Range Status  08/20/2023 13 10 - 35 U/L Final         Passed - ALT in normal range and within 360 days    ALT  Date Value Ref Range Status  08/20/2023 11 6 - 29 U/L Final         Passed - Completed PHQ-2 or PHQ-9 in the last 360 days      Passed - Last BP in normal range    BP Readings from Last 1 Encounters:  08/20/23 132/74         Passed - Valid encounter within last 6 months    Recent Outpatient Visits           1 month ago Encounter for general adult medical examination with abnormal findings   [place] [facility] [place], Rankin Buzzard, NP

## 2023-11-13 ENCOUNTER — Other Ambulatory Visit: Payer: Self-pay | Admitting: Internal Medicine

## 2023-11-13 NOTE — Telephone Encounter (Unsigned)
 Copied from CRM [id number]. Topic: Clinical - Medication Refill >> Nov 13, 2023 11:11 AM Lizabeth Riggs wrote: Medication: zolpidem  (AMBIEN  CR) 6.25 MG CR tablet  Has the patient contacted their pharmacy? Yes (Agent: If no, request that the patient contact the pharmacy for the refill. If patient does not wish to contact the pharmacy document the reason why and proceed with request.) (Agent: If yes, when and what did the pharmacy advise?) Pharmacy needs order to refill  This is the patient's preferred pharmacy:  [facility] [address] ([address]), [place] - [address] [place]) [place] [address] Phone: [phone] Fax: [phone]  Is this the correct pharmacy for this prescription? Yes If no, delete pharmacy and type the correct one.   Has the prescription been filled recently? No  Is the patient out of the medication? No  Has the patient been seen for an appointment in the last year OR does the patient have an upcoming appointment? Yes  Can we respond through MyChart? Yes  Agent: Please be advised that Rx refills may take up to 3 business days. We ask that you follow-up with your pharmacy.

## 2023-11-14 MED ORDER — ZOLPIDEM TARTRATE ER 6.25 MG PO TBCR: 6.2500 mg | EXTENDED_RELEASE_TABLET | Freq: Every evening | ORAL | 0 refills | Status: DC | PRN

## 2023-11-14 NOTE — Telephone Encounter (Signed)
 Requested medications are due for refill today.  yes  Requested medications are on the active medications list.  yes  Last refill. 10/14/2023 #30 0 rf  Future visit scheduled.   yes  Notes to clinic.  Refill not delegated.    Requested Prescriptions  Pending Prescriptions Disp Refills   zolpidem  (AMBIEN  CR) 6.25 MG CR tablet 30 tablet 0    Sig: Take 1 tablet (6.25 mg total) by mouth at bedtime as needed. for sleep     Not Delegated - Psychiatry:  Anxiolytics/Hypnotics Failed - 11/14/2023 12:52 PM      Failed - This refill cannot be delegated      Failed - Urine Drug Screen completed in last 360 days      Passed - Valid encounter within last 6 months    Recent Outpatient Visits           2 months ago Encounter for general adult medical examination with abnormal findings   [place] [facility] [place], Rankin Buzzard, NP

## 2023-11-21 DIAGNOSIS — M62830 Muscle spasm of back: Secondary | ICD-10-CM | POA: Diagnosis not present

## 2023-11-21 DIAGNOSIS — M5124 Other intervertebral disc displacement, thoracic region: Secondary | ICD-10-CM | POA: Diagnosis not present

## 2023-11-21 DIAGNOSIS — M4802 Spinal stenosis, cervical region: Secondary | ICD-10-CM | POA: Diagnosis not present

## 2023-11-21 DIAGNOSIS — M48062 Spinal stenosis, lumbar region with neurogenic claudication: Secondary | ICD-10-CM | POA: Diagnosis not present

## 2023-11-21 DIAGNOSIS — M5412 Radiculopathy, cervical region: Secondary | ICD-10-CM | POA: Diagnosis not present

## 2023-11-21 DIAGNOSIS — M7551 Bursitis of right shoulder: Secondary | ICD-10-CM | POA: Diagnosis not present

## 2023-11-21 DIAGNOSIS — M5414 Radiculopathy, thoracic region: Secondary | ICD-10-CM | POA: Diagnosis not present

## 2023-11-21 DIAGNOSIS — Z79899 Other long term (current) drug therapy: Secondary | ICD-10-CM | POA: Diagnosis not present

## 2023-11-21 DIAGNOSIS — M7552 Bursitis of left shoulder: Secondary | ICD-10-CM | POA: Diagnosis not present

## 2023-11-21 DIAGNOSIS — M5416 Radiculopathy, lumbar region: Secondary | ICD-10-CM | POA: Diagnosis not present

## 2023-12-17 ENCOUNTER — Other Ambulatory Visit: Payer: Self-pay | Admitting: Internal Medicine

## 2023-12-17 NOTE — Telephone Encounter (Unsigned)
 Copied from CRM [id number]. Topic: Clinical - Medication Refill >> Dec 17, 2023 12:35 PM Carlatta H wrote: Medication: zolpidem  (AMBIEN  CR) 6.25 MG CR tablet  Has the patient contacted their pharmacy? No (Agent: If no, request that the patient contact the pharmacy for the refill. If patient does not wish to contact the pharmacy document the reason why and proceed with request.) (Agent: If yes, when and what did the pharmacy advise?)  This is the patient's preferred pharmacy:  [facility] [address] ([address]), [place] - [address] [place]) [place] [address] Phone: [phone] Fax: [phone]  Is this the correct pharmacy for this prescription? Yes If no, delete pharmacy and type the correct one.   Has the prescription been filled recently? No  Is the patient out of the medication? Yes  Has the patient been seen for an appointment in the last year OR does the patient have an upcoming appointment? Yes  Can we respond through MyChart? No  Agent: Please be advised that Rx refills may take up to 3 business days. We ask that you follow-up with your pharmacy.

## 2023-12-18 ENCOUNTER — Other Ambulatory Visit: Payer: Self-pay | Admitting: Internal Medicine

## 2023-12-18 ENCOUNTER — Telehealth: Payer: Self-pay

## 2023-12-18 ENCOUNTER — Ambulatory Visit: Payer: Self-pay

## 2023-12-18 MED ORDER — ZOLPIDEM TARTRATE ER 6.25 MG PO TBCR: 6.2500 mg | EXTENDED_RELEASE_TABLET | Freq: Every evening | ORAL | 0 refills | Status: DC | PRN

## 2023-12-18 NOTE — Telephone Encounter (Signed)
 Called CAL line and spoke with Vernell. Advised that pt has been without med for sleep and has not slept in 4 days. Advised that PCP is not in office. Please call pt if unable or able to refill. Sent high priority due to nature of lack of sleep. This is the 3rd request.

## 2023-12-18 NOTE — Telephone Encounter (Signed)
 Copied from CRM [id number]. Topic: Clinical - Prescription Issue >> Dec 18, 2023  1:30 PM Thliyah D wrote: zolpidem  (AMBIEN  CR) 6.25 MG CR tablet- patient wants to know why medication was denied. She needs this prescription.
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
## 2024-01-20 ENCOUNTER — Other Ambulatory Visit: Payer: Self-pay | Admitting: Internal Medicine

## 2024-01-20 DIAGNOSIS — F5104 Psychophysiologic insomnia: Secondary | ICD-10-CM

## 2024-01-20 DIAGNOSIS — F4321 Adjustment disorder with depressed mood: Secondary | ICD-10-CM

## 2024-01-23 ENCOUNTER — Other Ambulatory Visit: Payer: Self-pay | Admitting: Internal Medicine

## 2024-01-23 DIAGNOSIS — F5104 Psychophysiologic insomnia: Secondary | ICD-10-CM

## 2024-01-23 DIAGNOSIS — F4321 Adjustment disorder with depressed mood: Secondary | ICD-10-CM

## 2024-01-23 NOTE — Telephone Encounter (Signed)
 Requested Prescriptions  Pending Prescriptions Disp Refills   zolpidem  (AMBIEN  CR) 6.25 MG CR tablet [Pharmacy Med Name: Zolpidem  Tartrate ER 6.25 MG Oral Tablet Extended Release] 30 tablet 0    Sig: TAKE 1 TABLET BY MOUTH AT BEDTIME AS NEEDED FOR SLEEP     Not Delegated - Psychiatry:  Anxiolytics/Hypnotics Failed - 01/23/2024 10:44 AM      Failed - This refill cannot be delegated      Failed - Urine Drug Screen completed in last 360 days      Passed - Valid encounter within last 6 months    Recent Outpatient Visits           5 months ago Encounter for general adult medical examination with abnormal findings   [place] [facility] [place], [place] W, NP               traZODone  (DESYREL ) 50 MG tablet [Pharmacy Med Name: traZODone  HCl 50 MG Oral Tablet] 90 tablet 0    Sig: TAKE 1 TABLET BY MOUTH AT BEDTIME AS NEEDED FOR SLEEP     Psychiatry: Antidepressants - Serotonin Modulator Passed - 01/23/2024 10:44 AM      Passed - Completed PHQ-2 or PHQ-9 in the last 360 days      Passed - Valid encounter within last 6 months    Recent Outpatient Visits           5 months ago Encounter for general adult medical examination with abnormal findings   [place] [facility] [place], [place] W, NP               rOPINIRole  (REQUIP  XL) 2 MG 24 hr tablet [Pharmacy Med Name: rOPINIRole  HCl ER 2 MG Oral Tablet Extended Release 24 Hour] 90 tablet 0    Sig: TAKE 1 TABLET BY MOUTH AT BEDTIME     Neurology:  Parkinsonian Agents Passed - 01/23/2024 10:44 AM      Passed - Last BP in normal range    BP Readings from Last 1 Encounters:  08/20/23 132/74         Passed - Last Heart Rate in normal range    Pulse Readings from Last 1 Encounters:  02/14/23 72         Passed - Valid encounter within last 12 months    Recent Outpatient Visits           5 months ago Encounter for general adult medical examination with abnormal findings   [place] [facility] [place], Angeline [place]N, [place]

## 2024-01-23 NOTE — Telephone Encounter (Unsigned)
 Copied from CRM [id number]. Topic: Clinical - Medication Refill >> Jan 23, 2024 10:24 AM Donna BRAVO wrote: Patient is out of these medications    Medication:   pharmacy sent request 01/20/24    rOPINIRole  (REQUIP  XL) 2 MG 24 hr tablet - zolpidem  (AMBIEN  CR) 6.25 MG CR tablet traZODone  (DESYREL ) 50 MG tablet  Has the patient contacted their pharmacy? Yes Pharmacy stated they have not received a new prescription for these medications   This is the patient's preferred pharmacy:   [facility] [address]), [place] - [address] [place]) [place] [address] Phone: [phone] Fax: [phone]  Is this the correct pharmacy for this prescription? Yes If no, delete pharmacy and type the correct one.   Has the prescription been filled recently? No  Is the patient out of the medication? Yes  Has the patient been seen for an appointment in the last year OR does the patient have an upcoming appointment? Yes  Can we respond through MyChart? No  Agent: Please be advised that Rx refills may take up to 3 business days. We ask that you follow-up with your pharmacy.

## 2024-01-23 NOTE — Telephone Encounter (Signed)
 Requested medications are due for refill today.  yes  Requested medications are on the active medications list.  yes  Last refill. 12/18/2023 #30 0 rf  Future visit scheduled.   yes  Notes to clinic.  Refill not delegated.    Requested Prescriptions  Pending Prescriptions Disp Refills   zolpidem  (AMBIEN  CR) 6.25 MG CR tablet [Pharmacy Med Name: Zolpidem  Tartrate ER 6.25 MG Oral Tablet Extended Release] 30 tablet 0    Sig: TAKE 1 TABLET BY MOUTH AT BEDTIME AS NEEDED FOR SLEEP     Not Delegated - Psychiatry:  Anxiolytics/Hypnotics Failed - 01/23/2024 10:44 AM      Failed - This refill cannot be delegated      Failed - Urine Drug Screen completed in last 360 days      Passed - Valid encounter within last 6 months    Recent Outpatient Visits           5 months ago Encounter for general adult medical examination with abnormal findings   [place] [facility] [place], Angeline ORN, NP              Signed Prescriptions Disp Refills   traZODone  (DESYREL ) 50 MG tablet 90 tablet 0    Sig: TAKE 1 TABLET BY MOUTH AT BEDTIME AS NEEDED FOR SLEEP     Psychiatry: Antidepressants - Serotonin Modulator Passed - 01/23/2024 10:44 AM      Passed - Completed PHQ-2 or PHQ-9 in the last 360 days      Passed - Valid encounter within last 6 months    Recent Outpatient Visits           5 months ago Encounter for general adult medical examination with abnormal findings   [place] [facility] [place], [place] W, NP               rOPINIRole  (REQUIP  XL) 2 MG 24 hr tablet 90 tablet 0    Sig: TAKE 1 TABLET BY MOUTH AT BEDTIME     Neurology:  Parkinsonian Agents Passed - 01/23/2024 10:44 AM      Passed - Last BP in normal range    BP Readings from Last 1 Encounters:  08/20/23 132/74         Passed - Last Heart Rate in normal range    Pulse Readings from Last 1 Encounters:  02/14/23 72         Passed - Valid encounter within last 12 months    Recent  Outpatient Visits           5 months ago Encounter for general adult medical examination with abnormal findings   [facility] [place], Angeline ORN, NP

## 2024-01-27 NOTE — Telephone Encounter (Signed)
 Requested medication (s) are due for refill today: no  Requested medication (s) are on the active medication list: yes  Last refill:  all reordered 01/23/24  Future visit scheduled: yes  Notes to clinic:  NT not delegated to refuse these non delegated meds   Requested Prescriptions  Pending Prescriptions Disp Refills   rOPINIRole  (REQUIP  XL) 2 MG 24 hr tablet 90 tablet 0    Sig: Take 1 tablet (2 mg total) by mouth at bedtime.     Neurology:  Parkinsonian Agents Passed - 01/27/2024  1:06 PM      Passed - Last BP in normal range    BP Readings from Last 1 Encounters:  08/20/23 132/74         Passed - Last Heart Rate in normal range    Pulse Readings from Last 1 Encounters:  02/14/23 72         Passed - Valid encounter within last 12 months    Recent Outpatient Visits           5 months ago Encounter for general adult medical examination with abnormal findings   [place] [facility] [place], Angeline ORN, NP               traZODone  (DESYREL ) 50 MG tablet 90 tablet 0    Sig: Take 1 tablet (50 mg total) by mouth at bedtime as needed. for sleep     Psychiatry: Antidepressants - Serotonin Modulator Passed - 01/27/2024  1:06 PM      Passed - Completed PHQ-2 or PHQ-9 in the last 360 days      Passed - Valid encounter within last 6 months    Recent Outpatient Visits           5 months ago Encounter for general adult medical examination with abnormal findings   [facility] [place], [place] W, NP               zolpidem  (AMBIEN  CR) 6.25 MG CR tablet 30 tablet 0    Sig: Take 1 tablet (6.25 mg total) by mouth at bedtime as needed. for sleep     Not Delegated - Psychiatry:  Anxiolytics/Hypnotics Failed - 01/27/2024  1:06 PM      Failed - This refill cannot be delegated      Failed - Urine Drug Screen completed in last 360 days      Passed - Valid encounter within last 6 months    Recent Outpatient Visits           5 months  ago Encounter for general adult medical examination with abnormal findings   [place] [facility] [place], Angeline ORN, NP

## 2024-01-28 MED ORDER — ZOLPIDEM TARTRATE ER 6.25 MG PO TBCR: 6.2500 mg | EXTENDED_RELEASE_TABLET | Freq: Every evening | ORAL | 0 refills | Status: DC | PRN

## 2024-02-20 DIAGNOSIS — M5412 Radiculopathy, cervical region: Secondary | ICD-10-CM | POA: Diagnosis not present

## 2024-02-20 DIAGNOSIS — M5414 Radiculopathy, thoracic region: Secondary | ICD-10-CM | POA: Diagnosis not present

## 2024-02-20 DIAGNOSIS — M5416 Radiculopathy, lumbar region: Secondary | ICD-10-CM | POA: Diagnosis not present

## 2024-02-20 DIAGNOSIS — M7551 Bursitis of right shoulder: Secondary | ICD-10-CM | POA: Diagnosis not present

## 2024-02-20 DIAGNOSIS — M7552 Bursitis of left shoulder: Secondary | ICD-10-CM | POA: Diagnosis not present

## 2024-02-20 DIAGNOSIS — M48062 Spinal stenosis, lumbar region with neurogenic claudication: Secondary | ICD-10-CM | POA: Diagnosis not present

## 2024-02-20 DIAGNOSIS — M5124 Other intervertebral disc displacement, thoracic region: Secondary | ICD-10-CM | POA: Diagnosis not present

## 2024-02-20 DIAGNOSIS — M62830 Muscle spasm of back: Secondary | ICD-10-CM | POA: Diagnosis not present

## 2024-02-20 DIAGNOSIS — M4802 Spinal stenosis, cervical region: Secondary | ICD-10-CM | POA: Diagnosis not present

## 2024-02-20 DIAGNOSIS — Z79899 Other long term (current) drug therapy: Secondary | ICD-10-CM | POA: Diagnosis not present

## 2024-02-25 ENCOUNTER — Ambulatory Visit: Admitting: Internal Medicine

## 2024-03-04 ENCOUNTER — Encounter: Payer: Self-pay | Admitting: Internal Medicine

## 2024-03-11 ENCOUNTER — Other Ambulatory Visit: Payer: Self-pay | Admitting: Internal Medicine

## 2024-03-12 ENCOUNTER — Ambulatory Visit (INDEPENDENT_AMBULATORY_CARE_PROVIDER_SITE_OTHER): Admitting: Internal Medicine

## 2024-03-12 ENCOUNTER — Encounter: Payer: Self-pay | Admitting: Internal Medicine

## 2024-03-12 VITALS — BP 140/80 | Ht 65.0 in | Wt 192.0 lb

## 2024-03-12 DIAGNOSIS — M150 Primary generalized (osteo)arthritis: Secondary | ICD-10-CM | POA: Diagnosis not present

## 2024-03-12 DIAGNOSIS — I700 Atherosclerosis of aorta: Secondary | ICD-10-CM

## 2024-03-12 DIAGNOSIS — F5104 Psychophysiologic insomnia: Secondary | ICD-10-CM

## 2024-03-12 DIAGNOSIS — Z8673 Personal history of transient ischemic attack (TIA), and cerebral infarction without residual deficits: Secondary | ICD-10-CM | POA: Diagnosis not present

## 2024-03-12 DIAGNOSIS — I82561 Chronic embolism and thrombosis of right calf muscular vein: Secondary | ICD-10-CM | POA: Diagnosis not present

## 2024-03-12 DIAGNOSIS — R7303 Prediabetes: Secondary | ICD-10-CM | POA: Diagnosis not present

## 2024-03-12 DIAGNOSIS — K219 Gastro-esophageal reflux disease without esophagitis: Secondary | ICD-10-CM

## 2024-03-12 DIAGNOSIS — E782 Mixed hyperlipidemia: Secondary | ICD-10-CM | POA: Diagnosis not present

## 2024-03-12 DIAGNOSIS — R61 Generalized hyperhidrosis: Secondary | ICD-10-CM

## 2024-03-12 DIAGNOSIS — E6609 Other obesity due to excess calories: Secondary | ICD-10-CM

## 2024-03-12 DIAGNOSIS — J45998 Other asthma: Secondary | ICD-10-CM | POA: Diagnosis not present

## 2024-03-12 DIAGNOSIS — I10 Essential (primary) hypertension: Secondary | ICD-10-CM

## 2024-03-12 DIAGNOSIS — F329 Major depressive disorder, single episode, unspecified: Secondary | ICD-10-CM | POA: Diagnosis not present

## 2024-03-12 DIAGNOSIS — G2581 Restless legs syndrome: Secondary | ICD-10-CM

## 2024-03-12 NOTE — Assessment & Plan Note (Signed)
 Encouraged diet and exercise for weight loss ?

## 2024-03-12 NOTE — Assessment & Plan Note (Addendum)
 Complicated by morbid obesity Discussed BP goal of < 130/80 given her prior history of stroke She declines restarted lisinopril  20 mg daily but will continue furosemide  20 mg daily Reinforced DASH diet and exercise for weight loss C-Met today

## 2024-03-12 NOTE — Telephone Encounter (Signed)
 Requested medications are due for refill today.  yes  Requested medications are on the active medications list.  yes  Last refill. 08/20/2023 #90 1 rf  Future visit scheduled.   yes  Notes to clinic.  Labs are expired.    Requested Prescriptions  Pending Prescriptions Disp Refills   furosemide  (LASIX ) 20 MG tablet [Pharmacy Med Name: Furosemide  20 MG Oral Tablet] 90 tablet 0    Sig: Take 1 tablet by mouth once daily     Cardiovascular:  Diuretics - Loop Failed - 03/12/2024  2:54 PM      Failed - K in normal range and within 180 days    Potassium  Date Value Ref Range Status  08/20/2023 4.4 3.5 - 5.3 mmol/L Final         Failed - Ca in normal range and within 180 days    Calcium   Date Value Ref Range Status  08/20/2023 9.8 8.6 - 10.4 mg/dL Final         Failed - Na in normal range and within 180 days    Sodium  Date Value Ref Range Status  08/20/2023 139 135 - 146 mmol/L Final         Failed - Cr in normal range and within 180 days    Creat  Date Value Ref Range Status  08/20/2023 0.83 0.60 - 1.00 mg/dL Final         Failed - Cl in normal range and within 180 days    Chloride  Date Value Ref Range Status  08/20/2023 99 98 - 110 mmol/L Final         Failed - Mg Level in normal range and within 180 days    Magnesium   Date Value Ref Range Status  12/21/2019 1.9 1.7 - 2.4 mg/dL Final    Comment:    Performed at [facility], [address] [place], [place] [address]         Failed - Last BP in normal range    BP Readings from Last 1 Encounters:  03/12/24 (!) 142/84         Passed - Valid encounter within last 6 months    Recent Outpatient Visits           Today Mixed hyperlipidemia   [place] [facility] [place], Angeline ORN, NP   6 months ago Encounter for general adult medical examination with abnormal findings   [facility] [facility] [place], Angeline ORN, NP

## 2024-03-12 NOTE — Assessment & Plan Note (Signed)
 C-Met and lipid profile today Not currently on statin therapy but discussed possibly trying this 2-3 times weekly Continue aspirin  81 mg daily Discussed BP goal < 130/80. She declines restarting lisinopril  20 mg daily but will continue furosemide  20 mg daily Encourage low-fat diet

## 2024-03-12 NOTE — Assessment & Plan Note (Signed)
 Continue trazodone  50 mg and ambien  6.25 mg at bedtime  Will monitor She has failed stopping the trazodone  in the past

## 2024-03-12 NOTE — Assessment & Plan Note (Addendum)
 Complicated by morbid obesity Avoid foods that trigger reflux Encourage weight loss as this can reduce reflux symptoms Continue omeprazole  20 mg daily and tums as needed

## 2024-03-12 NOTE — Assessment & Plan Note (Signed)
 C-Met and lipid profile today Not currently on statin therapy but discussed possibly trying this 2-3 times weekly Continue aspirin  81 mg daily Encourage low-fat diet

## 2024-03-12 NOTE — Assessment & Plan Note (Addendum)
 Complicated by morbid obesity C-Met and lipid profile today Not currently on statin therapy but discussed possibly trying this 2-3 times weekly Encourage low-fat diet

## 2024-03-12 NOTE — Patient Instructions (Signed)

## 2024-03-12 NOTE — Assessment & Plan Note (Signed)
 Continue albuterol  108 mc/act as needed Will monitor

## 2024-03-12 NOTE — Assessment & Plan Note (Signed)
 Continue gabapentin  300 mg in am, 600 mg in pm and ropinirole  2 mg XL at bedtime Will monitor

## 2024-03-12 NOTE — Assessment & Plan Note (Addendum)
 Complicated by morbid obesity Continue hydrocodone  7.5-325 mg and gabapentin  300 mg in am 600 mg in pm as prescribed by pain management Will check ANA, ESR and CRP today (? Fibromyalgia)

## 2024-03-12 NOTE — Progress Notes (Signed)
 Subjective:    Patient ID: Linda Farley, female    DOB: 08-26-1950, 73 y.o.   MRN: [id number]  HPI  Patient presents to clinic today for 42-month follow-up of chronic conditions.  HTN: Her BP today is 142/84.  She is not taking lisinopril  but is taking furosemide  as prescribed. She feels like her blood pressure has been too low at home.  ECG from 12/2019 reviewed.  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  She is not taking any cholesterol-lowering medication at this time.  She is taking aspirin .  She tries to consume a low-fat diet.  She does not follow with neurology.  Chronic DVT: Managed with aspirin  daily.  She no longer follows with vascular.  Depression: Chronic, managed on citalopram  and bupropion . She has been more stressed lately, has an upcoming move. She is not currently seeing a therapist.  She denies anxiety, SI/HI.  OA: Mainly in her back and shoulders. She is having worsening joint and muscle pain, typically occurring in flares. She has had back injections in the past. She takes gabapentin  and hydrocodone  as prescribed with some relief of symptoms.  She follows with pain management.  GERD: She is not sure what triggers this.  She has occasional breakthrough on omeprazole  for which she takes tums as needed with good relief of symptoms.  There is no upper GI on file.  Prediabetes: Her last A1c was 5.7 %, 08/2023.  She is not taking any oral diabetic medication at this time.  She does not check her sugars.  Asthma: Seasonal.  Managed with albuterol  as needed.  There are no PFTs on file.  She does not follow with pulmonology or an allergist.  RLS: Managed with gabapentin  and ropinirole .  She does not follow with neurology.  Insomnia: She has difficulty falling and staying asleep.  She is taking trazodone  and ambien  as prescribed.  Sleep study from 07/2019 reviewed.  Review of Systems     Past Medical History:   Diagnosis Date   Allergy    Anxiety    Arthritis    Depression    Hypertension     Current Outpatient Medications  Medication Sig Dispense Refill   albuterol  (VENTOLIN  HFA) 108 (90 Base) MCG/ACT inhaler INHALE 2 PUFFS BY MOUTH EVERY 6 HOURS AS NEEDED FOR WHEEZING FOR SHORTNESS OF BREATH 18 g 2   aspirin  EC 81 MG tablet Take 1 tablet (81 mg total) by mouth daily. Swallow whole.     buPROPion  (WELLBUTRIN  XL) 150 MG 24 hr tablet Take 1 tablet by mouth once daily 90 tablet 0   calcium  carbonate (TUMS - DOSED IN MG ELEMENTAL CALCIUM ) 500 MG chewable tablet Chew 1 tablet by mouth daily.     Cholecalciferol (D3 SUPER STRENGTH) 50 MCG (2000 UT) CAPS Take by mouth.     citalopram  (CELEXA ) 20 MG tablet Take 1 tablet (20 mg total) by mouth daily. 90 tablet 1   furosemide  (LASIX ) 20 MG tablet Take 1 tablet (20 mg total) by mouth daily. 90 tablet 1   gabapentin  (NEURONTIN ) 300 MG capsule Take 1 capsule (300 mg total) by mouth as directed. One capsule in the morning and two capsules every day at bedtime 270 capsule 1   HYDROcodone -acetaminophen  (NORCO) 7.5-325 MG tablet      lisinopril  (ZESTRIL ) 20 MG tablet Take 1 tablet (20 mg total) by mouth daily. 90 tablet 1   nystatin  (MYCOSTATIN /NYSTOP ) powder APPLY TOPICALLY THREE TIMES DAILY  60 g 0   omeprazole  (PRILOSEC) 20 MG capsule Take 1 capsule (20 mg total) by mouth daily. 90 capsule 1   rOPINIRole  (REQUIP  XL) 2 MG 24 hr tablet TAKE 1 TABLET BY MOUTH AT BEDTIME 90 tablet 0   traZODone  (DESYREL ) 50 MG tablet TAKE 1 TABLET BY MOUTH AT BEDTIME AS NEEDED FOR SLEEP 90 tablet 0   zolpidem  (AMBIEN  CR) 6.25 MG CR tablet TAKE 1 TABLET BY MOUTH AT BEDTIME AS NEEDED FOR SLEEP 30 tablet 0   zolpidem  (AMBIEN  CR) 6.25 MG CR tablet Take 1 tablet (6.25 mg total) by mouth at bedtime as needed. for sleep 30 tablet 0   No current facility-administered medications for this visit.    Allergies  Allergen Reactions   Chlorhexidine    Penicillins Rash    Family  History  Problem Relation Age of Onset   Prostate cancer Father    Bone cancer Brother    Heart disease Brother     Social History   Socioeconomic History   Marital status: Married    Spouse name: Not on file   Number of children: Not on file   Years of education: Not on file   Highest education level: Not on file  Occupational History   Not on file  Tobacco Use   Smoking status: Never   Smokeless tobacco: Never  Vaping Use   Vaping status: Never Used  Substance and Sexual Activity   Alcohol use: Yes    Alcohol/week: 1.0 standard drink of alcohol    Types: 1 Glasses of wine per week    Comment: 3 wine coolers weekly   Drug use: Never   Sexual activity: Not on file  Other Topics Concern   Not on file  Social History Narrative   Not on file   Social Drivers of Health   Financial Resource Strain: Not on file  Food Insecurity: No Food Insecurity (05/16/2023)   Received from [facility]   Hunger Vital Sign    Within the past 12 months, you worried that your food would run out before you got the money to buy more.: Never true    Within the past 12 months, the food you bought just didn't last and you didn't have money to get more.: Never true  Transportation Needs: No Transportation Needs (03/25/2023)   [facility] from [facility]   PRAPARE - Transportation    Lack of Transportation (Medical): No    Lack of Transportation (Non-Medical): No  Physical Activity: Not on file  Stress: Not on file  Social Connections: Not on file  Intimate Partner Violence: Not on file     Constitutional: Pt reports chills. Denies fever, malaise, fatigue, headache or abrupt weight changes.  HEENT: Denies eye pain, eye redness, ear pain, ringing in the ears, wax buildup, runny nose, nasal congestion, bloody nose, or sore throat. Respiratory: Denies difficulty breathing, shortness of breath, cough or sputum production.   Cardiovascular: Denies chest pain, chest tightness, palpitations  or swelling in the hands or feet.  Gastrointestinal: Patient reports intermittent reflux.  Denies abdominal pain, bloating, constipation, diarrhea or blood in the stool.  GU: Denies urgency, frequency, pain with urination, burning sensation, blood in urine, odor or discharge. Musculoskeletal: Patient reports joint pain, intermittent difficulty with gait.  Denies decrease in range of motion, difficulty with gait, muscle pain or joint swelling.  Skin: Pt reports excessive sweating. Denies redness, rashes, lesions or ulcercations.  Neurological: Patient reports left side facial drooping, insomnia  and restless legs.  Denies dizziness, difficulty with memory, difficulty with speech or problems with balance and coordination.  Psych: Patient has a history of depression.  Denies anxiety, SI/HI.  No other specific complaints in a complete review of systems (except as listed in HPI above).  Objective:   Physical Exam BP (!) 142/84 (BP Location: Left Arm, Patient Position: Sitting, Cuff Size: Large)   Ht 5' 5 (1.651 m)   Wt 192 lb (87.1 kg)   BMI 31.95 kg/m    Wt Readings from Last 3 Encounters:  08/20/23 199 lb 12.8 oz (90.6 kg)  02/14/23 206 lb (93.4 kg)  07/19/22 206 lb (93.4 kg)    General: Appears her stated age, obese, in NAD. Skin: Warm, dry and intact.   HEENT: Head: normal shape and size; Eyes: sclera white, no icterus, conjunctiva pink, PERRLA and EOMs intact;  Cardiovascular: Normal rate and rhythm. S1,S2 noted.  No murmur, rubs or gallops noted. No JVD or BLE edema. Lipidemia noted of BLE. No carotid bruits noted. Pulmonary/Chest: Normal effort and positive vesicular breath sounds. No respiratory distress. No wheezes, rales or ronchi noted.  Abdomen: Soft and nontender. Normal bowel sounds.  Musculoskeletal: Joint enlargement noted of bilateral knees. Gait slow and steady without device. Neurological: Alert and oriented. Cranial nerves II-XII grossly intact. Coordination normal.   Psychiatric: Mood and affect normal. Behavior is normal. Judgment and thought content normal.     BMET    Component Value Date/Time   NA 139 08/20/2023 1324   K 4.4 08/20/2023 1324   CL 99 08/20/2023 1324   CO2 33 (H) 08/20/2023 1324   GLUCOSE 93 08/20/2023 1324   BUN 28 (H) 08/20/2023 1324   CREATININE 0.83 08/20/2023 1324   CALCIUM  9.8 08/20/2023 1324   GFRNONAA >60 12/23/2019 0508   GFRAA >60 12/23/2019 0508    Lipid Panel     Component Value Date/Time   CHOL 180 08/20/2023 1324   TRIG 143 08/20/2023 1324   HDL 62 08/20/2023 1324   CHOLHDL 2.9 08/20/2023 1324   VLDL 16 12/19/2019 0552   LDLCALC 94 08/20/2023 1324    CBC    Component Value Date/Time   WBC 8.3 08/20/2023 1324   RBC 5.00 08/20/2023 1324   HGB 14.4 08/20/2023 1324   HCT 44.5 08/20/2023 1324   PLT 240 08/20/2023 1324   MCV 89.0 08/20/2023 1324   MCH 28.8 08/20/2023 1324   MCHC 32.4 08/20/2023 1324   RDW 13.0 08/20/2023 1324   LYMPHSABS 2,651 09/03/2019 0928   EOSABS 74 09/03/2019 0928   BASOSABS 74 09/03/2019 0928    Hgb A1C Lab Results  Component Value Date   HGBA1C 5.7 (H) 08/20/2023          Assessment & Plan:   Excessive sweating:  Will check TSH Discussed glycopyrrolate  however she would like to hold off given the side effect profile  RTC in 6 months for your annual exam Angeline Laura, NP

## 2024-03-12 NOTE — Assessment & Plan Note (Signed)
 Continue citalopram  20 mg and bupropion  150 mg XL daily Support offered

## 2024-03-12 NOTE — Assessment & Plan Note (Signed)
 Continue aspirin  81 mg daily No long following with vascular

## 2024-03-12 NOTE — Assessment & Plan Note (Addendum)
 Complicated by morbid obesity A1c today Encourage low-carb diet and exercise for weight loss

## 2024-03-13 ENCOUNTER — Ambulatory Visit: Payer: Self-pay | Admitting: Internal Medicine

## 2024-03-14 MED ORDER — ATORVASTATIN CALCIUM 10 MG PO TABS: 10.0000 mg | ORAL_TABLET | Freq: Every day | ORAL | 1 refills | Status: AC

## 2024-03-15 LAB — HEMOGLOBIN A1C
Hgb A1c MFr Bld: 5.7 % — ABNORMAL HIGH (ref <5.7)
Mean Plasma Glucose: 117 mg/dL
eAG (mmol/L): 6.5 mmol/L

## 2024-03-15 LAB — LIPID PANEL
Cholesterol: 186 mg/dL (ref <200)
HDL: 62 mg/dL (ref >=50)
LDL Cholesterol (Calc): 101 mg/dL — ABNORMAL HIGH
Non-HDL Cholesterol (Calc): 124 mg/dL (ref <130)
Total CHOL/HDL Ratio: 3 (calc) (ref <5.0)
Triglycerides: 124 mg/dL (ref <150)

## 2024-03-15 LAB — COMPREHENSIVE METABOLIC PANEL WITH GFR
AG Ratio: 1.8 (calc) (ref 1.0–2.5)
ALT: 15 U/L (ref 6–29)
AST: 16 U/L (ref 10–35)
Albumin: 4.3 g/dL (ref 3.6–5.1)
Alkaline phosphatase (APISO): 77 U/L (ref 37–153)
BUN: 20 mg/dL (ref 7–25)
CO2: 31 mmol/L (ref 20–32)
Calcium: 9.4 mg/dL (ref 8.6–10.4)
Chloride: 100 mmol/L (ref 98–110)
Creat: 0.79 mg/dL (ref 0.60–1.00)
Globulin: 2.4 g/dL (ref 1.9–3.7)
Glucose, Bld: 90 mg/dL (ref 65–139)
Potassium: 3.8 mmol/L (ref 3.5–5.3)
Sodium: 141 mmol/L (ref 135–146)
Total Bilirubin: 0.5 mg/dL (ref 0.2–1.2)
Total Protein: 6.7 g/dL (ref 6.1–8.1)
eGFR: 78 mL/min/1.73m2 (ref >=60)

## 2024-03-15 LAB — CBC
HCT: 46.8 % — ABNORMAL HIGH (ref 35.0–45.0)
Hemoglobin: 15.2 g/dL (ref 11.7–15.5)
MCH: 29.6 pg (ref 27.0–33.0)
MCHC: 32.5 g/dL (ref 32.0–36.0)
MCV: 91.2 fL (ref 80.0–100.0)
MPV: 12 fL (ref 7.5–12.5)
Platelets: 204 Thousand/uL (ref 140–400)
RBC: 5.13 Million/uL — ABNORMAL HIGH (ref 3.80–5.10)
RDW: 13.5 % (ref 11.0–15.0)
WBC: 8.3 Thousand/uL (ref 3.8–10.8)

## 2024-03-15 LAB — C-REACTIVE PROTEIN: CRP: 3.8 mg/L (ref <8.0)

## 2024-03-15 LAB — SEDIMENTATION RATE: Sed Rate: 14 mm/h (ref 0–30)

## 2024-03-15 LAB — TSH: TSH: 1.07 m[IU]/L (ref 0.40–4.50)

## 2024-03-15 LAB — ANTI-NUCLEAR AB-TITER (ANA TITER): ANA Titer 1: 1:320 {titer} — ABNORMAL HIGH

## 2024-03-15 LAB — ANA: Anti Nuclear Antibody (ANA): POSITIVE — AB

## 2024-03-22 ENCOUNTER — Other Ambulatory Visit: Payer: Self-pay | Admitting: Internal Medicine

## 2024-03-23 NOTE — Addendum Note (Signed)
 Addended by: ZELIA GAUZE D on: 03/23/2024 02:56 PM   Modules accepted: Orders

## 2024-03-24 MED ORDER — BUPROPION HCL ER (XL) 150 MG PO TB24: 150.0000 mg | ORAL_TABLET | Freq: Every day | ORAL | 0 refills | Status: DC

## 2024-03-24 MED ORDER — ZOLPIDEM TARTRATE ER 6.25 MG PO TBCR: 6.2500 mg | EXTENDED_RELEASE_TABLET | Freq: Every evening | ORAL | 0 refills | Status: DC | PRN

## 2024-03-24 NOTE — Addendum Note (Signed)
 Addended by: ANTONETTE ANGELINE ORN on: 03/24/2024 08:08 AM   Modules accepted: Orders

## 2024-03-24 NOTE — Telephone Encounter (Signed)
 Requested Prescriptions  Pending Prescriptions Disp Refills   zolpidem  (AMBIEN  CR) 6.25 MG CR tablet [Pharmacy Med Name: Zolpidem  Tartrate ER 6.25 MG Oral Tablet Extended Release] 30 tablet 0    Sig: TAKE 1 TABLET BY MOUTH AT BEDTIME AS NEEDED FOR SLEEP     Not Delegated - Psychiatry:  Anxiolytics/Hypnotics Failed - 03/24/2024  3:24 PM      Failed - This refill cannot be delegated      Failed - Urine Drug Screen completed in last 360 days      Passed - Valid encounter within last 6 months    Recent Outpatient Visits           1 week ago Mixed hyperlipidemia   [place] [facility] [place], Angeline ORN, NP   7 months ago Encounter for general adult medical examination with abnormal findings   [place] [place], Angeline ORN, NP              Refused Prescriptions Disp Refills   rOPINIRole  (REQUIP  XL) 2 MG 24 hr tablet [Pharmacy Med Name: rOPINIRole  HCl ER 2 MG Oral Tablet Extended Release 24 Hour] 90 tablet 0    Sig: TAKE 1 TABLET BY MOUTH AT BEDTIME     Neurology:  Parkinsonian Agents Failed - 03/24/2024  3:24 PM      Failed - Last BP in normal range    BP Readings from Last 1 Encounters:  03/12/24 (!) 140/80         Passed - Last Heart Rate in normal range    Pulse Readings from Last 1 Encounters:  02/14/23 72         Passed - Valid encounter within last 12 months    Recent Outpatient Visits           1 week ago Mixed hyperlipidemia   [place] [facility] [place], Angeline ORN, NP   7 months ago Encounter for general adult medical examination with abnormal findings    [facility] [place], Angeline ORN, NP

## 2024-03-24 NOTE — Telephone Encounter (Signed)
 Requested medications are due for refill today.  no  Requested medications are on the active medications list.  yes  Last refill. 03/24/2024  Future visit scheduled.   yes  Notes to clinic.  Refusal not delegated    Requested Prescriptions  Pending Prescriptions Disp Refills   zolpidem  (AMBIEN  CR) 6.25 MG CR tablet [Pharmacy Med Name: Zolpidem  Tartrate ER 6.25 MG Oral Tablet Extended Release] 30 tablet 0    Sig: TAKE 1 TABLET BY MOUTH AT BEDTIME AS NEEDED FOR SLEEP     Not Delegated - Psychiatry:  Anxiolytics/Hypnotics Failed - 03/24/2024  3:24 PM      Failed - This refill cannot be delegated      Failed - Urine Drug Screen completed in last 360 days      Passed - Valid encounter within last 6 months    Recent Outpatient Visits           1 week ago Mixed hyperlipidemia   [facility] [place], Angeline ORN, NP   7 months ago Encounter for general adult medical examination with abnormal findings   [place] [facility] [place], Angeline ORN, NP              Refused Prescriptions Disp Refills   rOPINIRole  (REQUIP  XL) 2 MG 24 hr tablet [Pharmacy Med Name: rOPINIRole  HCl ER 2 MG Oral Tablet Extended Release 24 Hour] 90 tablet 0    Sig: TAKE 1 TABLET BY MOUTH AT BEDTIME     Neurology:  Parkinsonian Agents Failed - 03/24/2024  3:24 PM      Failed - Last BP in normal range    BP Readings from Last 1 Encounters:  03/12/24 (!) 140/80         Passed - Last Heart Rate in normal range    Pulse Readings from Last 1 Encounters:  02/14/23 72         Passed - Valid encounter within last 12 months    Recent Outpatient Visits           1 week ago Mixed hyperlipidemia   [place] [facility] [place], Angeline ORN, NP   7 months ago Encounter for general adult medical examination with abnormal findings   [place] [facility] [place], Angeline ORN, NP

## 2024-04-04 ENCOUNTER — Other Ambulatory Visit: Payer: Self-pay | Admitting: Internal Medicine

## 2024-04-04 DIAGNOSIS — F329 Major depressive disorder, single episode, unspecified: Secondary | ICD-10-CM

## 2024-04-06 NOTE — Telephone Encounter (Signed)
 Requested Prescriptions  Pending Prescriptions Disp Refills   citalopram  (CELEXA ) 20 MG tablet [Pharmacy Med Name: Citalopram  Hydrobromide 20 MG Oral Tablet] 90 tablet 1    Sig: Take 1 tablet by mouth once daily     Psychiatry:  Antidepressants - SSRI Passed - 04/06/2024  2:57 PM      Passed - Completed PHQ-2 or PHQ-9 in the last 360 days      Passed - Valid encounter within last 6 months    Recent Outpatient Visits           3 weeks ago Mixed hyperlipidemia   [place] [facility] [place], Angeline ORN, NP   7 months ago Encounter for general adult medical examination with abnormal findings   [place] [facility] [place], Angeline ORN, NP

## 2024-04-17 ENCOUNTER — Other Ambulatory Visit: Payer: Self-pay | Admitting: Internal Medicine

## 2024-04-17 DIAGNOSIS — F4321 Adjustment disorder with depressed mood: Secondary | ICD-10-CM

## 2024-04-17 DIAGNOSIS — F5104 Psychophysiologic insomnia: Secondary | ICD-10-CM

## 2024-04-18 NOTE — Telephone Encounter (Signed)
 Requested medication (s) are due for refill today: yes  Requested medication (s) are on the active medication list: yes  Last refill:  03/24/24  Future visit scheduled: yes  Notes to clinic:  Unable to refill per protocol, cannot delegate.      Requested Prescriptions  Pending Prescriptions Disp Refills   zolpidem  (AMBIEN  CR) 6.25 MG CR tablet [Pharmacy Med Name: Zolpidem  Tartrate ER 6.25 MG Oral Tablet Extended Release] 30 tablet 0    Sig: TAKE 1 TABLET BY MOUTH AT BEDTIME AS NEEDED FOR SLEEP     Not Delegated - Psychiatry:  Anxiolytics/Hypnotics Failed - 04/18/2024  7:14 AM      Failed - This refill cannot be delegated      Failed - Urine Drug Screen completed in last 360 days      Passed - Valid encounter within last 6 months    Recent Outpatient Visits           1 month ago Mixed hyperlipidemia   [place] [facility] [place], Angeline ORN, NP   8 months ago Encounter for general adult medical examination with abnormal findings   [place] [facility] [place], Angeline ORN, NP              Signed Prescriptions Disp Refills   traZODone  (DESYREL ) 50 MG tablet 90 tablet 0    Sig: TAKE 1 TABLET BY MOUTH AT BEDTIME AS NEEDED FOR SLEEP     Psychiatry: Antidepressants - Serotonin Modulator Passed - 04/18/2024  7:14 AM      Passed - Completed PHQ-2 or PHQ-9 in the last 360 days      Passed - Valid encounter within last 6 months    Recent Outpatient Visits           1 month ago Mixed hyperlipidemia   [place] [facility] [place], Angeline ORN, NP   8 months ago Encounter for general adult medical examination with abnormal findings   [place] [facility] [place] [place], Angeline ORN, NP

## 2024-04-18 NOTE — Telephone Encounter (Signed)
 Requested Prescriptions  Pending Prescriptions Disp Refills   traZODone  (DESYREL ) 50 MG tablet [Pharmacy Med Name: traZODone  HCl 50 MG Oral Tablet] 90 tablet 0    Sig: TAKE 1 TABLET BY MOUTH AT BEDTIME AS NEEDED FOR SLEEP     Psychiatry: Antidepressants - Serotonin Modulator Passed - 04/18/2024  7:12 AM      Passed - Completed PHQ-2 or PHQ-9 in the last 360 days      Passed - Valid encounter within last 6 months    Recent Outpatient Visits           1 month ago Mixed hyperlipidemia   [place] [facility] [place], Angeline ORN, NP   8 months ago Encounter for general adult medical examination with abnormal findings   [place] [facility] [place], [place], NP               zolpidem  (AMBIEN  CR) 6.25 MG CR tablet [Pharmacy Med Name: Zolpidem  Tartrate ER 6.25 MG Oral Tablet Extended Release] 30 tablet 0    Sig: TAKE 1 TABLET BY MOUTH AT BEDTIME AS NEEDED FOR SLEEP     Not Delegated - Psychiatry:  Anxiolytics/Hypnotics Failed - 04/18/2024  7:12 AM      Failed - This refill cannot be delegated      Failed - Urine Drug Screen completed in last 360 days      Passed - Valid encounter within last 6 months    Recent Outpatient Visits           1 month ago Mixed hyperlipidemia   [place] [facility] [place], Angeline ORN, NP   8 months ago Encounter for general adult medical examination with abnormal findings   [place] [facility] [place], Angeline ORN, NP

## 2024-04-19 ENCOUNTER — Other Ambulatory Visit: Payer: Self-pay | Admitting: Internal Medicine

## 2024-04-21 NOTE — Telephone Encounter (Signed)
 Requested Prescriptions  Pending Prescriptions Disp Refills   rOPINIRole  (REQUIP  XL) 2 MG 24 hr tablet [Pharmacy Med Name: rOPINIRole  HCl ER 2 MG Oral Tablet Extended Release 24 Hour] 90 tablet 1    Sig: TAKE 1 TABLET BY MOUTH AT BEDTIME     Neurology:  Parkinsonian Agents Failed - 04/21/2024 12:18 PM      Failed - Last BP in normal range    BP Readings from Last 1 Encounters:  03/12/24 (!) 140/80         Passed - Last Heart Rate in normal range    Pulse Readings from Last 1 Encounters:  02/14/23 72         Passed - Valid encounter within last 12 months    Recent Outpatient Visits           1 month ago Mixed hyperlipidemia   [place] [facility] [place], Angeline ORN, NP   8 months ago Encounter for general adult medical examination with abnormal findings   [place] [facility] [place], Angeline ORN, NP

## 2024-05-12 ENCOUNTER — Other Ambulatory Visit: Payer: Self-pay | Admitting: Internal Medicine

## 2024-05-15 NOTE — Telephone Encounter (Signed)
.   Requested Prescriptions  Pending Prescriptions Disp Refills   omeprazole  (PRILOSEC) 20 MG capsule [Pharmacy Med Name: Omeprazole  20 MG Oral Capsule Delayed Release] 90 capsule 1    Sig: Take 1 capsule by mouth once daily     Gastroenterology: Proton Pump Inhibitors Passed - 05/15/2024  1:17 PM      Passed - Valid encounter within last 12 months    Recent Outpatient Visits           2 months ago Mixed hyperlipidemia   [place] [facility] [place], Angeline ORN, NP   8 months ago Encounter for general adult medical examination with abnormal findings   [place] [facility] [place], Angeline ORN, NP

## 2024-05-29 ENCOUNTER — Other Ambulatory Visit: Payer: Self-pay | Admitting: Internal Medicine

## 2024-05-29 ENCOUNTER — Encounter: Payer: Self-pay | Admitting: Internal Medicine

## 2024-05-29 NOTE — Telephone Encounter (Unsigned)
 Copied from CRM #[id number]. Topic: Clinical - Medication Refill >> May 29, 2024 12:13 PM Kevelyn M wrote: Medication: zolpidem  (AMBIEN  CR) 6.25 MG CR tablet  Has the patient contacted their pharmacy? Yes (Agent: If no, request that the patient contact the pharmacy for the refill. If patient does not wish to contact the pharmacy document the reason why and proceed with request.) (Agent: If yes, when and what did the pharmacy advise?)  This is the patient's preferred pharmacy:  [facility] [address] [place], [address] Phone: [phone] Fax: [phone]  Is this the correct pharmacy for this prescription? Yes If no, delete pharmacy and type the correct one.   Has the prescription been filled recently? No  Is the patient out of the medication? Yes  Has the patient been seen for an appointment in the last year OR does the patient have an upcoming appointment? Yes  Can we respond through MyChart? No  Agent: Please be advised that Rx refills may take up to 3 business days. We ask that you follow-up with your pharmacy.

## 2024-06-02 MED ORDER — ZOLPIDEM TARTRATE ER 6.25 MG PO TBCR: 6.2500 mg | EXTENDED_RELEASE_TABLET | Freq: Every evening | ORAL | 0 refills | Status: DC | PRN

## 2024-06-02 NOTE — Telephone Encounter (Signed)
 Requested medication (s) are due for refill today - yes  Requested medication (s) are on the active medication list -yes  Future visit scheduled -yes  Last refill: 11 #30/10/25  Notes to clinic: non delegated Rx  Requested Prescriptions  Pending Prescriptions Disp Refills   zolpidem  (AMBIEN  CR) 6.25 MG CR tablet 30 tablet 0    Sig: Take 1 tablet (6.25 mg total) by mouth at bedtime as needed. for sleep     Not Delegated - Psychiatry:  Anxiolytics/Hypnotics Failed - 06/02/2024  2:17 PM      Failed - This refill cannot be delegated      Failed - Urine Drug Screen completed in last 360 days      Passed - Valid encounter within last 6 months    Recent Outpatient Visits           2 months ago Mixed hyperlipidemia   [place] [facility] [place], Angeline ORN, NP   9 months ago Encounter for general adult medical examination with abnormal findings   [place] [facility] [place], Angeline ORN, NP                 Requested Prescriptions  Pending Prescriptions Disp Refills   zolpidem  (AMBIEN  CR) 6.25 MG CR tablet 30 tablet 0    Sig: Take 1 tablet (6.25 mg total) by mouth at bedtime as needed. for sleep     Not Delegated - Psychiatry:  Anxiolytics/Hypnotics Failed - 06/02/2024  2:17 PM      Failed - This refill cannot be delegated      Failed - Urine Drug Screen completed in last 360 days      Passed - Valid encounter within last 6 months    Recent Outpatient Visits           2 months ago Mixed hyperlipidemia   [place] [facility] [place], Angeline ORN, NP   9 months ago Encounter for general adult medical examination with abnormal findings   [place] [facility] [place], Angeline ORN, NP

## 2024-06-08 ENCOUNTER — Other Ambulatory Visit: Payer: Self-pay | Admitting: Internal Medicine

## 2024-06-09 NOTE — Telephone Encounter (Signed)
 Requested medications are due for refill today.  yes  Requested medications are on the active medications list.  yes  Last refill. 03/12/2024 #90 0 rf  Future visit scheduled.   yes  Notes to clinic.  Mg lab is expired    Requested Prescriptions  Pending Prescriptions Disp Refills   furosemide  (LASIX ) 20 MG tablet [Pharmacy Med Name: Furosemide  20 MG Oral Tablet] 90 tablet 0    Sig: Take 1 tablet by mouth once daily     Cardiovascular:  Diuretics - Loop Failed - 06/09/2024  2:31 PM      Failed - Mg Level in normal range and within 180 days    Magnesium   Date Value Ref Range Status  12/21/2019 1.9 1.7 - 2.4 mg/dL Final    Comment:    Performed at [facility], [address] [place], [place] [address]         Failed - Last BP in normal range    BP Readings from Last 1 Encounters:  03/12/24 (!) 140/80         Passed - K in normal range and within 180 days    Potassium  Date Value Ref Range Status  03/12/2024 3.8 3.5 - 5.3 mmol/L Final         Passed - Ca in normal range and within 180 days    Calcium   Date Value Ref Range Status  03/12/2024 9.4 8.6 - 10.4 mg/dL Final         Passed - Na in normal range and within 180 days    Sodium  Date Value Ref Range Status  03/12/2024 141 135 - 146 mmol/L Final         Passed - Cr in normal range and within 180 days    Creat  Date Value Ref Range Status  03/12/2024 0.79 0.60 - 1.00 mg/dL Final         Passed - Cl in normal range and within 180 days    Chloride  Date Value Ref Range Status  03/12/2024 100 98 - 110 mmol/L Final         Passed - Valid encounter within last 6 months    Recent Outpatient Visits           2 months ago Mixed hyperlipidemia   [place] [facility] [place], Angeline ORN, NP   9 months ago Encounter for general adult medical examination with abnormal findings   [place] [facility] [place], Angeline ORN, NP

## 2024-06-25 ENCOUNTER — Other Ambulatory Visit: Payer: Self-pay | Admitting: Internal Medicine

## 2024-06-26 NOTE — Telephone Encounter (Signed)
 Requested medications are due for refill today.  unsure  Requested medications are on the active medications list.  yes  Last refill. 06/02/2024 330 0 rf  Future visit scheduled.   yes  Notes to clinic.  Refill/refusal not delegated.    Requested Prescriptions  Pending Prescriptions Disp Refills   zolpidem  (AMBIEN  CR) 6.25 MG CR tablet [Pharmacy Med Name: ZOLPIDEM  TART ER 6.25 MG TAB] 30 tablet 0    Sig: Take 1 tablet (6.25 mg total) bymouth at bedtime as needed. for sleep     Not Delegated - Psychiatry:  Anxiolytics/Hypnotics Failed - 06/26/2024 10:59 AM      Failed - This refill cannot be delegated      Failed - Urine Drug Screen completed in last 360 days      Passed - Valid encounter within last 6 months    Recent Outpatient Visits           3 months ago Mixed hyperlipidemia   [place] [facility] [place], Angeline ORN, NP   10 months ago Encounter for general adult medical examination with abnormal findings   [place] [facility] [place], Angeline ORN, NP

## 2024-06-26 NOTE — Telephone Encounter (Signed)
 Requested Prescriptions  Pending Prescriptions Disp Refills   buPROPion  (WELLBUTRIN  XL) 150 MG 24 hr tablet [Pharmacy Med Name: buPROPion  HCl ER (XL) 150 MG Oral Tablet Extended Release 24 Hour] 90 tablet 0    Sig: Take 1 tablet by mouth once daily     Psychiatry: Antidepressants - bupropion  Failed - 06/26/2024 11:02 AM      Failed - Last BP in normal range    BP Readings from Last 1 Encounters:  03/12/24 (!) 140/80         Passed - Cr in normal range and within 360 days    Creat  Date Value Ref Range Status  03/12/2024 0.79 0.60 - 1.00 mg/dL Final         Passed - AST in normal range and within 360 days    AST  Date Value Ref Range Status  03/12/2024 16 10 - 35 U/L Final         Passed - ALT in normal range and within 360 days    ALT  Date Value Ref Range Status  03/12/2024 15 6 - 29 U/L Final         Passed - Completed PHQ-2 or PHQ-9 in the last 360 days      Passed - Valid encounter within last 6 months    Recent Outpatient Visits           3 months ago Mixed hyperlipidemia   [place] [facility] [place], Angeline ORN, NP   10 months ago Encounter for general adult medical examination with abnormal findings   [place] [facility] [place], Angeline ORN, NP

## 2024-06-29 ENCOUNTER — Other Ambulatory Visit: Payer: Self-pay | Admitting: Internal Medicine

## 2024-06-30 NOTE — Telephone Encounter (Signed)
 Requested medication (s) are due for refill today: yes  Requested medication (s) are on the active medication list: yes  Last refill:  06/02/24  Future visit scheduled: yes  Notes to clinic:  Unable to refill per protocol, cannot delegate.      Requested Prescriptions  Pending Prescriptions Disp Refills   zolpidem  (AMBIEN  CR) 6.25 MG CR tablet [Pharmacy Med Name: ZOLPIDEM  TART ER 6.25 MG TAB] 30 tablet 0    Sig: Take 1 tablet (6.25 mg total) bymouth at bedtime as needed. for sleep     Not Delegated - Psychiatry:  Anxiolytics/Hypnotics Failed - 06/30/2024 10:40 AM      Failed - This refill cannot be delegated      Failed - Urine Drug Screen completed in last 360 days      Passed - Valid encounter within last 6 months    Recent Outpatient Visits           3 months ago Mixed hyperlipidemia   [facility] [facility] [place], Angeline ORN, NP   10 months ago Encounter for general adult medical examination with abnormal findings   [place] [facility] [place], Angeline ORN, NP

## 2024-07-16 ENCOUNTER — Other Ambulatory Visit: Payer: Self-pay | Admitting: Internal Medicine

## 2024-07-16 DIAGNOSIS — F4321 Adjustment disorder with depressed mood: Secondary | ICD-10-CM

## 2024-07-16 DIAGNOSIS — F5104 Psychophysiologic insomnia: Secondary | ICD-10-CM

## 2024-07-17 NOTE — Telephone Encounter (Signed)
 Requested Prescriptions  Pending Prescriptions Disp Refills   traZODone  (DESYREL ) 50 MG tablet [Pharmacy Med Name: traZODone  HCl 50 MG Oral Tablet] 90 tablet 0    Sig: TAKE 1 TABLET BY MOUTH AT BEDTIME AS NEEDED FOR SLEEP     Psychiatry: Antidepressants - Serotonin Modulator Passed - 07/17/2024  2:44 PM      Passed - Completed PHQ-2 or PHQ-9 in the last 360 days      Passed - Valid encounter within last 6 months    Recent Outpatient Visits           4 months ago Mixed hyperlipidemia   [place] [facility] [place], Angeline ORN, NP   11 months ago Encounter for general adult medical examination with abnormal findings   [place] [facility] [place], Angeline ORN, NP

## 2024-08-24 ENCOUNTER — Encounter: Admitting: Internal Medicine
